# Patient Record
Sex: Male | Born: 1995 | Race: White | Hispanic: No | Marital: Single | Smoking: Never smoker
Health system: Southern US, Community
[De-identification: ages and names within clinical notes are randomized; demographics above are authoritative.]

## PROBLEM LIST (undated history)

## (undated) DIAGNOSIS — I82629 Acute embolism and thrombosis of deep veins of unspecified upper extremity: Secondary | ICD-10-CM

## (undated) DIAGNOSIS — D696 Thrombocytopenia, unspecified: Secondary | ICD-10-CM

## (undated) DIAGNOSIS — J40 Bronchitis, not specified as acute or chronic: Secondary | ICD-10-CM

---

## 2015-09-14 ENCOUNTER — Emergency Department (HOSPITAL_BASED_OUTPATIENT_CLINIC_OR_DEPARTMENT_OTHER): Payer: BLUE CROSS/BLUE SHIELD

## 2015-09-14 ENCOUNTER — Encounter (HOSPITAL_BASED_OUTPATIENT_CLINIC_OR_DEPARTMENT_OTHER): Payer: Self-pay | Admitting: Emergency Medicine

## 2015-09-14 ENCOUNTER — Inpatient Hospital Stay
Admission: EM | Admit: 2015-09-14 | Payer: Self-pay | Source: Other Acute Inpatient Hospital | Admitting: Cardiovascular Disease

## 2015-09-14 ENCOUNTER — Inpatient Hospital Stay (HOSPITAL_BASED_OUTPATIENT_CLINIC_OR_DEPARTMENT_OTHER)
Admission: EM | Admit: 2015-09-14 | Discharge: 2015-10-01 | DRG: 871 | Disposition: A | Discharge status: Home Health | Payer: BLUE CROSS/BLUE SHIELD | Attending: Internal Medicine | Admitting: Internal Medicine

## 2015-09-14 DIAGNOSIS — I509 Heart failure, unspecified: Secondary | ICD-10-CM | POA: Diagnosis not present

## 2015-09-14 DIAGNOSIS — B3322 Viral myocarditis: Secondary | ICD-10-CM | POA: Diagnosis present

## 2015-09-14 DIAGNOSIS — R233 Spontaneous ecchymoses: Secondary | ICD-10-CM | POA: Diagnosis present

## 2015-09-14 DIAGNOSIS — I272 Other secondary pulmonary hypertension: Secondary | ICD-10-CM | POA: Diagnosis present

## 2015-09-14 DIAGNOSIS — A419 Sepsis, unspecified organism: Principal | ICD-10-CM

## 2015-09-14 DIAGNOSIS — I82409 Acute embolism and thrombosis of unspecified deep veins of unspecified lower extremity: Secondary | ICD-10-CM | POA: Insufficient documentation

## 2015-09-14 DIAGNOSIS — N189 Chronic kidney disease, unspecified: Secondary | ICD-10-CM | POA: Diagnosis present

## 2015-09-14 DIAGNOSIS — E872 Acidosis: Secondary | ICD-10-CM

## 2015-09-14 DIAGNOSIS — N179 Acute kidney failure, unspecified: Secondary | ICD-10-CM | POA: Diagnosis present

## 2015-09-14 DIAGNOSIS — R6520 Severe sepsis without septic shock: Secondary | ICD-10-CM | POA: Diagnosis present

## 2015-09-14 DIAGNOSIS — R945 Abnormal results of liver function studies: Secondary | ICD-10-CM

## 2015-09-14 DIAGNOSIS — E669 Obesity, unspecified: Secondary | ICD-10-CM | POA: Diagnosis present

## 2015-09-14 DIAGNOSIS — J181 Lobar pneumonia, unspecified organism: Secondary | ICD-10-CM | POA: Diagnosis present

## 2015-09-14 DIAGNOSIS — Z6841 Body Mass Index (BMI) 40.0 and over, adult: Secondary | ICD-10-CM | POA: Diagnosis not present

## 2015-09-14 DIAGNOSIS — E871 Hypo-osmolality and hyponatremia: Secondary | ICD-10-CM | POA: Diagnosis present

## 2015-09-14 DIAGNOSIS — I998 Other disorder of circulatory system: Secondary | ICD-10-CM | POA: Diagnosis present

## 2015-09-14 DIAGNOSIS — R570 Cardiogenic shock: Secondary | ICD-10-CM | POA: Diagnosis not present

## 2015-09-14 DIAGNOSIS — E8809 Other disorders of plasma-protein metabolism, not elsewhere classified: Secondary | ICD-10-CM | POA: Diagnosis not present

## 2015-09-14 DIAGNOSIS — I5021 Acute systolic (congestive) heart failure: Secondary | ICD-10-CM | POA: Diagnosis not present

## 2015-09-14 DIAGNOSIS — F329 Major depressive disorder, single episode, unspecified: Secondary | ICD-10-CM | POA: Diagnosis not present

## 2015-09-14 DIAGNOSIS — I472 Ventricular tachycardia: Secondary | ICD-10-CM | POA: Diagnosis not present

## 2015-09-14 DIAGNOSIS — F419 Anxiety disorder, unspecified: Secondary | ICD-10-CM | POA: Diagnosis not present

## 2015-09-14 DIAGNOSIS — E876 Hypokalemia: Secondary | ICD-10-CM | POA: Diagnosis not present

## 2015-09-14 DIAGNOSIS — I429 Cardiomyopathy, unspecified: Secondary | ICD-10-CM | POA: Diagnosis not present

## 2015-09-14 DIAGNOSIS — J9601 Acute respiratory failure with hypoxia: Secondary | ICD-10-CM | POA: Diagnosis present

## 2015-09-14 DIAGNOSIS — I514 Myocarditis, unspecified: Secondary | ICD-10-CM

## 2015-09-14 DIAGNOSIS — I82629 Acute embolism and thrombosis of deep veins of unspecified upper extremity: Secondary | ICD-10-CM | POA: Diagnosis present

## 2015-09-14 DIAGNOSIS — I82621 Acute embolism and thrombosis of deep veins of right upper extremity: Secondary | ICD-10-CM | POA: Diagnosis not present

## 2015-09-14 DIAGNOSIS — I409 Acute myocarditis, unspecified: Secondary | ICD-10-CM | POA: Diagnosis not present

## 2015-09-14 DIAGNOSIS — R609 Edema, unspecified: Secondary | ICD-10-CM

## 2015-09-14 DIAGNOSIS — I82401 Acute embolism and thrombosis of unspecified deep veins of right lower extremity: Secondary | ICD-10-CM | POA: Diagnosis not present

## 2015-09-14 DIAGNOSIS — B3324 Viral cardiomyopathy: Secondary | ICD-10-CM | POA: Diagnosis present

## 2015-09-14 DIAGNOSIS — R600 Localized edema: Secondary | ICD-10-CM | POA: Diagnosis not present

## 2015-09-14 DIAGNOSIS — R918 Other nonspecific abnormal finding of lung field: Secondary | ICD-10-CM

## 2015-09-14 DIAGNOSIS — Z452 Encounter for adjustment and management of vascular access device: Secondary | ICD-10-CM | POA: Diagnosis present

## 2015-09-14 DIAGNOSIS — L270 Generalized skin eruption due to drugs and medicaments taken internally: Secondary | ICD-10-CM | POA: Diagnosis not present

## 2015-09-14 DIAGNOSIS — I420 Dilated cardiomyopathy: Secondary | ICD-10-CM | POA: Diagnosis present

## 2015-09-14 DIAGNOSIS — I1310 Hypertensive heart and chronic kidney disease without heart failure, with stage 1 through stage 4 chronic kidney disease, or unspecified chronic kidney disease: Secondary | ICD-10-CM | POA: Diagnosis present

## 2015-09-14 DIAGNOSIS — I400 Infective myocarditis: Secondary | ICD-10-CM | POA: Diagnosis not present

## 2015-09-14 DIAGNOSIS — M7989 Other specified soft tissue disorders: Secondary | ICD-10-CM | POA: Diagnosis present

## 2015-09-14 DIAGNOSIS — E875 Hyperkalemia: Secondary | ICD-10-CM | POA: Diagnosis present

## 2015-09-14 DIAGNOSIS — J189 Pneumonia, unspecified organism: Secondary | ICD-10-CM

## 2015-09-14 DIAGNOSIS — J129 Viral pneumonia, unspecified: Secondary | ICD-10-CM | POA: Diagnosis not present

## 2015-09-14 DIAGNOSIS — I38 Endocarditis, valve unspecified: Secondary | ICD-10-CM

## 2015-09-14 DIAGNOSIS — J9600 Acute respiratory failure, unspecified whether with hypoxia or hypercapnia: Secondary | ICD-10-CM

## 2015-09-14 DIAGNOSIS — R52 Pain, unspecified: Secondary | ICD-10-CM | POA: Diagnosis not present

## 2015-09-14 DIAGNOSIS — R0600 Dyspnea, unspecified: Secondary | ICD-10-CM | POA: Diagnosis not present

## 2015-09-14 DIAGNOSIS — I5023 Acute on chronic systolic (congestive) heart failure: Secondary | ICD-10-CM | POA: Diagnosis not present

## 2015-09-14 DIAGNOSIS — K761 Chronic passive congestion of liver: Secondary | ICD-10-CM | POA: Diagnosis present

## 2015-09-14 DIAGNOSIS — D696 Thrombocytopenia, unspecified: Secondary | ICD-10-CM | POA: Diagnosis not present

## 2015-09-14 DIAGNOSIS — K7689 Other specified diseases of liver: Secondary | ICD-10-CM | POA: Diagnosis not present

## 2015-09-14 DIAGNOSIS — R0602 Shortness of breath: Secondary | ICD-10-CM

## 2015-09-14 DIAGNOSIS — R7989 Other specified abnormal findings of blood chemistry: Secondary | ICD-10-CM

## 2015-09-14 DIAGNOSIS — R21 Rash and other nonspecific skin eruption: Secondary | ICD-10-CM | POA: Diagnosis not present

## 2015-09-14 HISTORY — DX: Acute embolism and thrombosis of deep veins of unspecified upper extremity: I82.629

## 2015-09-14 HISTORY — DX: Bronchitis, not specified as acute or chronic: J40

## 2015-09-14 HISTORY — DX: Thrombocytopenia, unspecified: D69.6

## 2015-09-14 LAB — I-STAT CG4 LACTIC ACID, ED: Lactic Acid, Venous: 8.62 mmol/L (ref 0.5–2.0)

## 2015-09-14 LAB — COMPREHENSIVE METABOLIC PANEL
ALBUMIN: 2.7 g/dL — AB (ref 3.5–5.0)
ALT: 66 U/L — ABNORMAL HIGH (ref 17–63)
AST: 77 U/L — AB (ref 15–41)
Alkaline Phosphatase: 82 U/L (ref 38–126)
Anion gap: 15 (ref 5–15)
BUN: 52 mg/dL — AB (ref 6–20)
CHLORIDE: 101 mmol/L (ref 101–111)
CO2: 16 mmol/L — AB (ref 22–32)
Calcium: 8.1 mg/dL — ABNORMAL LOW (ref 8.9–10.3)
Creatinine, Ser: 1.17 mg/dL (ref 0.61–1.24)
GFR calc Af Amer: >60 mL/min (ref >60)
GFR calc non Af Amer: >60 mL/min (ref >60)
GLUCOSE: 126 mg/dL — AB (ref 65–99)
POTASSIUM: 5.9 mmol/L — AB (ref 3.5–5.1)
Sodium: 132 mmol/L — ABNORMAL LOW (ref 135–145)
Total Bilirubin: 3.3 mg/dL — ABNORMAL HIGH (ref 0.3–1.2)
Total Protein: 6.2 g/dL — ABNORMAL LOW (ref 6.5–8.1)

## 2015-09-14 LAB — PROCALCITONIN: PROCALCITONIN: 2.32 ng/mL

## 2015-09-14 LAB — CBC WITH DIFFERENTIAL/PLATELET
Basophils Absolute: 0 10*3/uL (ref 0.0–0.1)
Basophils Relative: 0 %
EOS PCT: 0 %
Eosinophils Absolute: 0 10*3/uL (ref 0.0–0.7)
HEMATOCRIT: 43 % (ref 39.0–52.0)
Hemoglobin: 13.9 g/dL (ref 13.0–17.0)
LYMPHS ABS: 1.2 10*3/uL (ref 0.7–4.0)
Lymphocytes Relative: 6 %
MCH: 26.2 pg (ref 26.0–34.0)
MCHC: 32.3 g/dL (ref 30.0–36.0)
MCV: 81 fL (ref 78.0–100.0)
MONOS PCT: 9 %
Monocytes Absolute: 1.8 10*3/uL — ABNORMAL HIGH (ref 0.1–1.0)
NEUTROS PCT: 85 %
Neutro Abs: 17 10*3/uL — ABNORMAL HIGH (ref 1.7–7.7)
Platelets: UNDETERMINED 10*3/uL (ref 150–400)
RBC: 5.31 MIL/uL (ref 4.22–5.81)
RDW: 19.5 % — AB (ref 11.5–15.5)
WBC: 20 10*3/uL — AB (ref 4.0–10.5)

## 2015-09-14 LAB — GLUCOSE, CAPILLARY: GLUCOSE-CAPILLARY: 84 mg/dL (ref 65–99)

## 2015-09-14 LAB — CBC
HEMATOCRIT: 40.8 % (ref 39.0–52.0)
HEMOGLOBIN: 12.9 g/dL — AB (ref 13.0–17.0)
MCH: 26.2 pg (ref 26.0–34.0)
MCHC: 31.6 g/dL (ref 30.0–36.0)
MCV: 82.9 fL (ref 78.0–100.0)
Platelets: 162 10*3/uL (ref 150–400)
RBC: 4.92 MIL/uL (ref 4.22–5.81)
RDW: 17.6 % — ABNORMAL HIGH (ref 11.5–15.5)
WBC: 18 10*3/uL — ABNORMAL HIGH (ref 4.0–10.5)

## 2015-09-14 LAB — PROTIME-INR
INR: 1.96 — AB (ref 0.00–1.49)
Prothrombin Time: 22.2 seconds — ABNORMAL HIGH (ref 11.6–15.2)

## 2015-09-14 LAB — CBG MONITORING, ED: Glucose-Capillary: 139 mg/dL — ABNORMAL HIGH (ref 65–99)

## 2015-09-14 LAB — BRAIN NATRIURETIC PEPTIDE: B NATRIURETIC PEPTIDE 5: 1312.6 pg/mL — AB (ref 0.0–100.0)

## 2015-09-14 LAB — APTT: aPTT: 32 seconds (ref 24–37)

## 2015-09-14 MED ORDER — VANCOMYCIN HCL 500 MG IV SOLR: 500.0000 mg | Freq: Once | INTRAVENOUS | Status: DC

## 2015-09-14 MED ORDER — SODIUM CHLORIDE 0.9 % IV SOLN
1500.0000 mg | Freq: Once | INTRAVENOUS | Status: DC
  Filled 2015-09-14: qty 1500

## 2015-09-14 MED ORDER — SODIUM CHLORIDE 0.9 % IV BOLUS (SEPSIS)
1000.0000 mL | Freq: Once | INTRAVENOUS | Status: AC
  Administered 2015-09-14: 1000 mL via INTRAVENOUS

## 2015-09-14 MED ORDER — VANCOMYCIN HCL IN DEXTROSE 1-5 GM/200ML-% IV SOLN
1000.0000 mg | Freq: Once | INTRAVENOUS | Status: AC
  Administered 2015-09-14: 1000 mg via INTRAVENOUS
  Filled 2015-09-14: qty 200

## 2015-09-14 MED ORDER — VANCOMYCIN HCL IN DEXTROSE 1-5 GM/200ML-% IV SOLN
1000.0000 mg | Freq: Once | INTRAVENOUS | Status: DC
  Filled 2015-09-14: qty 200

## 2015-09-14 MED ORDER — PIPERACILLIN-TAZOBACTAM 3.375 G IVPB
3.3750 g | Freq: Three times a day (TID) | INTRAVENOUS | Status: DC
  Administered 2015-09-15 – 2015-09-21 (×18): 3.375 g via INTRAVENOUS
  Filled 2015-09-14 (×21): qty 50

## 2015-09-14 MED ORDER — FAMOTIDINE 20 MG PO TABS
20.0000 mg | ORAL_TABLET | Freq: Two times a day (BID) | ORAL | Status: DC
  Administered 2015-09-15 – 2015-10-01 (×33): 20 mg via ORAL
  Filled 2015-09-14 (×34): qty 1

## 2015-09-14 MED ORDER — SODIUM CHLORIDE 0.9 % IV BOLUS (SEPSIS)
500.0000 mL | Freq: Once | INTRAVENOUS | Status: AC
  Administered 2015-09-14: 500 mL via INTRAVENOUS

## 2015-09-14 MED ORDER — VANCOMYCIN HCL 10 G IV SOLR
1500.0000 mg | Freq: Three times a day (TID) | INTRAVENOUS | Status: DC
  Administered 2015-09-15 – 2015-09-18 (×10): 1500 mg via INTRAVENOUS
  Filled 2015-09-14 (×13): qty 1500

## 2015-09-14 MED ORDER — VANCOMYCIN HCL 500 MG IV SOLR
INTRAVENOUS | Status: AC
  Administered 2015-09-14: 500 mg
  Filled 2015-09-14: qty 500

## 2015-09-14 MED ORDER — PIPERACILLIN-TAZOBACTAM 3.375 G IVPB 30 MIN
3.3750 g | Freq: Once | INTRAVENOUS | Status: AC
  Administered 2015-09-14: 3.375 g via INTRAVENOUS
  Filled 2015-09-14 (×2): qty 50

## 2015-09-14 MED ORDER — SODIUM CHLORIDE 0.9 % IV SOLN: 250.0000 mL | INTRAVENOUS | Status: DC | PRN

## 2015-09-14 MED ORDER — SODIUM CHLORIDE 0.9 % IV SOLN
INTRAVENOUS | Status: DC
  Administered 2015-09-15 – 2015-09-20 (×3): via INTRAVENOUS

## 2015-09-14 MED ORDER — HEPARIN SODIUM (PORCINE) 5000 UNIT/ML IJ SOLN: 5000.0000 [IU] | Freq: Three times a day (TID) | INTRAMUSCULAR | Status: DC

## 2015-09-14 NOTE — ED Notes (Signed)
Spoke with pharmacist Georgina Pillion patient to receive total of 2500mg  of vanco 3rd dose infuse 1/2 bag.

## 2015-09-14 NOTE — ED Notes (Signed)
Critical Lactic Acid 8.62 reported to Dr. Verdie Mosher

## 2015-09-14 NOTE — ED Notes (Signed)
Pt given water ok'd by MD. Pt appears in mild respiratory distress.  Pt is tachypneic but states "I feel ok".

## 2015-09-14 NOTE — ED Notes (Signed)
Patient had been laying in his recliner for 2 weeks because he was sick with bronchitis. The patient has swelling noted to his bilateral feet and necrotic toes to up to mid feet.

## 2015-09-14 NOTE — Progress Notes (Signed)
Pharmacy Antibiotic Note  James Bennett is a 20 y.o. male who presented to [facility] admitted on 09/14/2015 with bilateral LE swelling (with noted discoloration of toes) for 1 week and SOB/cough for 2.5 months. Pharmacy consulted to dose Vancomycin + Cefepime for r/o sepsis.   The patient has already received a dose of Zosyn 3.375g IV x 1 dose at 1638. No Vancomycin has been given yet. SCr 1.17, CrCl~100 ml/min (normalized)  Vancomycin doses will have to be entered as sequential doses for a total loading dose of 2500 mg. This was discussed with the [facility] RN over the phone and clarification was given.   Plan: 1. Vancomycin 2500 mg IV x 1 dose to load (entered as 1g IV x 1 followed by 1g IV x 1 followed by 500 mg IV x 1 - discussed with RN) 2. After the Vancomycin LD, start Vancomycin 1500 mg IV every 8 hours (next dose due on 2/21 at 0800) 3. Zosyn 3.375g IV every 8 hours (next dose due at 0100 on 2/21) 4. Would recommend getting a Vancomycin trough prior to the 4th dose to access Vancomycin dose (using obesity protocol) 5. Will continue to follow renal function, culture results, LOT, and antibiotic de-escalation plans   Height: 6\' 3"  (190.5 cm) Weight: (!) 343 lb (155.584 kg) IBW/kg (Calculated) : 84.5  Temp (24hrs), Avg:97.4 F (36.3 C), Min:97.4 F (36.3 C), Max:97.4 F (36.3 C)   Recent Labs Lab 09/14/15 1610 09/14/15 1618  WBC 20.0*  --   LATICACIDVEN  --  8.62*    CrCl cannot be calculated (Patient has no serum creatinine result on file.).    No Known Allergies  Antimicrobials this admission: Zosyn 2/20 >> Vanc 2/20 >>  Dose adjustments this admission: n/a  Microbiology results: n/a  Thank you for allowing pharmacy to be a part of this patient's care.  Georgina Pillion, PharmD, BCPS Clinical Pharmacist Pager: [phone] 09/14/2015 4:51 PM

## 2015-09-14 NOTE — H&P (Signed)
PULMONARY / CRITICAL CARE MEDICINE   Name: James Bennett MRN: [id number] DOB: April 28, 1996    ADMISSION DATE:  09/14/2015 CONSULTATION DATE:  09/14/15  REFERRING MD:  Eustaquio Maize  CHIEF COMPLAINT:  Sepsis   HISTORY OF PRESENT ILLNESS:   James Bennett is a 20 y.o. male who presented to [facility] with complaints of gradual onset, constant, worsening, bilateral lower leg swelling x 1 week. Pt reports that he was having shortness of breath and a dry cough since New Years (approximately 2.5 months ago). He was seen by his PCP 3 weeks ago and diagnosed with acute bronchitis. Pt was placed on Azithromycin and Prednisone without much relief. He was seen again last week and placed on Levaquin. Pt was seen again a couple of days ago and placed on Amoxicillin and a second round of Prednisone. He reports that his shortness of breath has since improved but he is still having a dry cough. Pt mentions that he has been lying around in his recliner for the past 2 weeks due to his bronchitis. He was unconcerned about his lower leg swelling until he noticed this morning that his toes were slightly blackened/purple in color. States that the color changes have been occuring over the last 3 days. Associated coolness to extremities appreciated. Pt states that he has been urinating normally but mentions an orange color to the urine since being on antibiotics.   Of note, patient has 6 dogs at home and one parrot he is exposed to at his dad's job. No recent travel. Mother has h/o Lupus and RA.   Denies fever, chills, diarrhea, abdominal pain, nausea, vomiting, chest pain, or any other associated symptoms.   PAST MEDICAL HISTORY :  He  has a past medical history of Bronchitis.  PAST SURGICAL HISTORY: No reported surgeries.   No Known Allergies  No current facility-administered medications on file prior to encounter.   No current outpatient prescriptions on file prior to encounter.    FAMILY HISTORY:  Family  History  Problem Relation Age of Onset  . Lupus Mother     SOCIAL HISTORY: Social History   Social History  . Marital Status: Single    Spouse Name: N/A  . Number of Children: N/A  . Years of Education: N/A   Social History Main Topics  . Smoking status: Never Smoker   . Smokeless tobacco: None  . Alcohol Use: No  . Drug Use: No  . Sexual Activity: Not Asked   Other Topics Concern  . 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 Bird exposure through his father's workplace of a small parrot. No mold exposure. No recent travel.    REVIEW OF SYSTEMS:  Patient denies any new rashes or abnormal bruising. He denies any lymphadenopathy in his neck, groin, or axilla. A pertinent 14 point review of systems is negative except as per the history of presenting illness.  SUBJECTIVE:  Tachycardic. Resting comfortably in bed. On room air with normal BP.  VITAL SIGNS: BP 111/45 mmHg  Pulse 129  Temp(Src) 98.4 F (36.9 C) (Oral)  Resp 13  Ht 6' 2"  (1.88 m)  Wt 331 lb (150.141 kg)  BMI 42.48 kg/m2  SpO2 100%  HEMODYNAMICS:    VENTILATOR SETTINGS:    INTAKE / OUTPUT: I/O last 3 completed shifts: In: 2750 [I.V.:2750] Out: -   PHYSICAL EXAMINATION: General:  19yo WM with NAD, resting comfortably in bed,  awake Neuro:  A&Ox3, non-focal, follows commands. Sensation and strength intact HEENT: [place]/AT, dry MM, EOMI, no scleral icterus, O/P clear, no cervical adenopathy Lymphatics: No appreciable cervical or supraclavicular lymphadenopathy based on body habitus. Cardiovascular:  Tachycardic, regular rhythm, no m/r/g appreciated Lungs:  CTAB, no w/r/r, normal work of breathing on room air. Abdomen:  Soft, non-tender, +BS, protuberant  Musculoskeletal:  3+ pitting edema BLE, R hand swelling. Moves all extremities.  Cyanotic toes bilaterally.  Skin: Petechiae appreciated on legs and abdomen. Cool extremities  (feet and hands).  LABS:  BMET  Recent Labs Lab 09/14/15 1650  NA 132*  K 5.9*  CL 101  CO2 16*  BUN 52*  CREATININE 1.17  GLUCOSE 126*    Electrolytes  Recent Labs Lab 09/14/15 1650  CALCIUM 8.1*    CBC  Recent Labs Lab 09/14/15 1610  WBC 20.0*  HGB 13.9  HCT 43.0  PLT PLATELET CLUMPS NOTED ON SMEAR, UNABLE TO ESTIMATE    Coag's  Recent Labs Lab 09/14/15 1650  APTT 32  INR 1.96*    Sepsis Markers  Recent Labs Lab 09/14/15 1618 09/14/15 1650  LATICACIDVEN 8.62*  --   PROCALCITON  --  2.32    ABG No results for input(s): PHART, PCO2ART, PO2ART in the last 168 hours.  Liver Enzymes  Recent Labs Lab 09/14/15 1650  AST 77*  ALT 66*  ALKPHOS 82  BILITOT 3.3*  ALBUMIN 2.7*    Cardiac Enzymes No results for input(s): TROPONINI, PROBNP in the last 168 hours.  Glucose  Recent Labs Lab 09/14/15 1547 09/14/15 2205  GLUCAP 139* 84    Imaging US Venous Img Lower Bilateral  09/14/2015  CLINICAL DATA:  bilateral lower extremity swelling x1 0.5 weeks EXAM: BILATERAL LOWER EXTREMITY VENOUS DOPPLER ULTRASOUND TECHNIQUE: Gray-scale sonography with compression, as well as color and duplex ultrasound, were performed to evaluate the deep venous system from the level of the common femoral vein through the popliteal and proximal calf veins. COMPARISON:  None FINDINGS: Normal compressibility of the common femoral, superficial femoral, and popliteal veins, as well as the proximal calf veins. No filling defects to suggest DVT on grayscale or color Doppler imaging. Doppler waveforms show normal direction of venous flow, normal respiratory phasicity and response to augmentation. Marked subcutaneous edema right thigh, left ankle. Survey views of the contralateral common femoral vein are unremarkable. IMPRESSION: 1. No evidence of lower extremity deep vein thrombosis, BILATERALLY. Electronically Signed   By: Lucrezia Europe M.D.   On: 09/14/2015 18:27   Dg Chest  Port 1 View  09/14/2015  CLINICAL DATA:  20 year old with 2 week history of shortness of breath and 1-1/2 week history of bilateral lower extremity edema. Recent diagnosis of bronchitis. EXAM: PORTABLE CHEST 1 VIEW COMPARISON:  None. FINDINGS: Cardiac silhouette upper normal in size to slightly enlarged even allowing for AP portable technique. Airspace consolidation in the right upper lobe, right lower lobe, and lingula. No pleural effusions. Normal pulmonary vascularity. IMPRESSION: 1. Multilobar pneumonia involving the right upper lobe, right lower lobe and lingula. 2. Mild cardiomegaly. Electronically Signed   By: Evangeline Dakin M.D.   On: 09/14/2015 17:17     STUDIES:  2/20 - venous doppler US without signs of DVT 2/20 - CXR with multilobar PNA  CULTURES: BCx 2/20>> UCx 2/20>> RVP 2/20>> Sputum culture 2/20>>  ANTIBIOTICS: Vanc 2/20 >> Zoysn 2/20 >>  SIGNIFICANT EVENTS: 2/20 - admitted for severe sepsis  LINES/TUBES: PIVs  DISCUSSION: 20 y.o. male with no past  medical history. Presented to [facility] with bialteral leg swelling and dyspnea for last 2 months not improved with steroids and antibiotics. On admission met sepsis criteria. Lactic acid was 8.6, WBC 20, afebrile.   ASSESSMENT / PLAN:  PULMONARY A: ?Multilobar community acquired pneumonia vs autoimmune process causing ILD Tachypnea At risk for decompensation  P:   Monitor Respiratory status Maintain O2 sats >92%; supplement as needed CT chest ordered to further diagnosis lung findings IS  CARDIOVASCULAR A:  Elevated BNP Tachycardia  P:  Cardiac monitoring Repeat EKG in AM Trop Echo in AM - looking for endocarditis, HF  RENAL A:   AKI Hyponatremia Hyperkalemia  P:   Repeat CMP S/p 2L prior to admission Replace electrolytes as need NS @75mL  Monitor UOP  GASTROINTESTINAL A:   Elevated LFTs  P:   Regular diet PPI  Trend LFTs  HEMATOLOGIC A:   Petechiae   Leukocytosis Elevated INR  P:  Hold off on pharmacologic VTE prophylaxis Repeat coags in AM Trend CBC Monitor for bleeding  INFECTIOUS A:   Severe sepsis 2/2 ?CAP - qSOFA neg but meets SIRS criteria with HR, lactic acid, and leukocytosis Lactic acidosis  P:   Follow cultures Abx as above (vanc/zoysn) Trend Pct and lactic acid Arterial dopplers   ENDOCRINE A:   No acute issues P:   Monitor  AUTOIMMUNE A: H/o autoimmune disease in family Possible ILD from automimmune process on CXR  P: Full autoimmune lab work-up collected Follow labs  NEUROLOGIC A:   No acute issues At risk for acute metabolic encephalopathy   P:   RASS goal: 0 Monitor   FAMILY  - Updates: Family and patient updated at bedside - Inter-disciplinary family meet or Palliative Care meeting due by: 09/21/15  Luiz Blare, DO 09/14/2015, 11:56 PM PGY-2, [place]  PCCM Attending Note:  Patient seen and examined with resident physician. Please refer to admission H&P which I reviewed in detail. Patient presenting with an atypical picture for an infectious etiology but certainly could be an infectious endocarditis with septic embolization. Autoimmune pathologies must still be considered given his family history of lupus with blue discoloration in the toes suggestive of a vasculitis versus ischemia. Currently patient is on room air. Continuing autoimmune workup with serologies. Awaiting transthoracic echocardiogram. Assessing lower extremity arterial flow. Continuing broad-spectrum antibiotic therapy for possible pneumonia versus endocarditis. Holding off on diuresis at this time.  I have spent a total of 37 minutes of critical care time today caring for the patient and reviewing the patient's electronic medical record.  Sonia Baller Ashok Cordia, M.D. [facility] Pager:  [phone] After 3pm or if no response, call [phone]

## 2015-09-14 NOTE — ED Provider Notes (Signed)
CSN: [id number]     Arrival date & time 09/14/15  1531 History  By signing my name below, I, Tanda Rockers, attest that this documentation has been prepared under the direction and in the presence of Lavera Guise, MD. Electronically Signed: Tanda Rockers, ED Scribe. 09/14/2015. 4:10 PM.   Chief Complaint  Patient presents with  . Foot Swelling   The history is provided by the patient. No language interpreter was used.     HPI Comments: James Bennett is a 20 y.o. male who presents to the Emergency Department complaining of gradual onset, constant, worsening, bilateral lower leg swelling x 1 week. Pt reports that he was having shortness of breath and a dry cough since New Years (approximately 2.5 months ago). He was seen by his PCP 3 weeks ago and diagnosed with acute bronchitis. Pt was placed on Azithromycin and Prednisone without much relief. He was seen again last week and placed on Levaquin. Pt was seen again a couple of days ago and placed on Amoxicillin and a second round of Prednisone. He reports that his shortness of breath has since improved but he is still having a dry cough. Pt mentions that he has been lying around in his recliner for the past 2 weeks due to his bronchitis. He was unconcerned about his lower leg swelling until he noticed this morning that his toes were slightly blackened/purple in color, prompting him to call his father and came to the ED for further evaluation. Pt states that he has been urinating normally but mentions an orange color to the urine since being on antibiotics. Pt does report possible recent sick contact while working at an [employer]. Denies fever, chills, diarrhea, abdominal pain, nausea, vomiting, chest pain, or any other associated symptoms. No recent prolonged travel. Fhx Lupus and Rheumatoid Arthritis with mother. No IVDU.  Past Medical History  Diagnosis Date  . Bronchitis    History reviewed. No pertinent past surgical history. History reviewed. No  pertinent family history. Social History  Substance Use Topics  . Smoking status: Never Smoker   . Smokeless tobacco: None  . Alcohol Use: No    Review of Systems  Constitutional: Negative for fever and chills.  Respiratory: Positive for cough. Negative for shortness of breath.   Cardiovascular: Positive for leg swelling. Negative for chest pain.  Gastrointestinal: Negative for nausea, vomiting, abdominal pain and diarrhea.  Skin: Positive for color change (toes on bilateral feet).  All other systems reviewed and are negative.  Allergies  Review of patient's allergies indicates no known allergies.  Home Medications   Prior to Admission medications   Medication Sig Start Date End Date Taking? Authorizing Provider  amoxicillin-clavulanate (AUGMENTIN) 875-125 MG tablet Take 1 tablet by mouth 2 (two) times daily.   Yes Historical Provider, MD  prednisoLONE (ORAPRED ODT) 10 MG disintegrating tablet Take 20 mg by mouth daily.   Yes Historical Provider, MD   BP 111/45 mmHg  Pulse 129  Temp(Src) 98.7 F (37.1 C) (Oral)  Resp 13  Ht  (1.88 m)  Wt 331 lb (150.141 kg)  BMI 42.48 kg/m2  SpO2 100%   Physical Exam  Physical Exam  Nursing note and vitals reviewed. Constitutional: Looks unwell but is non toxic and in no acute distress, comfortable Head: Normocephalic and atraumatic.  Mouth/Throat: Oropharynx is clear. Dry mucous membranes.  Neck: Normal range of motion. Neck supple.  Cardiovascular: Tachycardic rate and regular rhythm.   Pulmonary/Chest: Effort normal and breath sounds normal.  Limited  by body habitus.  Abdominal: Soft. There is no tenderness. There is no rebound and no guarding.  Musculoskeletal: Normal range of motion. Diffuse pitting edema in bilateral upper and lower extremities and abdomen.  Neurological: Alert, no facial droop, fluent speech, moves all extremities symmetrically Skin: Skin is cool and dry. Petechial like rash in upper extremities. Blanching  violaceous/petechial rash over trunk. Distal toes are purple. Dopplerable DP pulses.  Psychiatric: Cooperative  ED Course  Procedures (including critical care time)  DIAGNOSTIC STUDIES: Oxygen Saturation is 100% on RA, normal by my interpretation.    COORDINATION OF CARE: 4:05 PM-Discussed treatment plan which includes admission with pt at bedside and pt agreed to plan.   Labs Review Labs Reviewed  CBC WITH DIFFERENTIAL/PLATELET - Abnormal; Notable for the following:    WBC 20.0 (*)    RDW 19.5 (*)    Neutro Abs 17.0 (*)    Monocytes Absolute 1.8 (*)    All other components within normal limits  BRAIN NATRIURETIC PEPTIDE - Abnormal; Notable for the following:    B Natriuretic Peptide 1312.6 (*)    All other components within normal limits  COMPREHENSIVE METABOLIC PANEL - Abnormal; Notable for the following:    Sodium 132 (*)    Potassium 5.9 (*)    CO2 16 (*)    Glucose, Bld 126 (*)    BUN 52 (*)    Calcium 8.1 (*)    Total Protein 6.2 (*)    Albumin 2.7 (*)    AST 77 (*)    ALT 66 (*)    Total Bilirubin 3.3 (*)    All other components within normal limits  PROTIME-INR - Abnormal; Notable for the following:    Prothrombin Time 22.2 (*)    INR 1.96 (*)    All other components within normal limits  I-STAT CG4 LACTIC ACID, ED - Abnormal; Notable for the following:    Lactic Acid, Venous 8.62 (*)    All other components within normal limits  CBG MONITORING, ED - Abnormal; Notable for the following:    Glucose-Capillary 139 (*)    All other components within normal limits  CULTURE, BLOOD (ROUTINE X 2)  CULTURE, BLOOD (ROUTINE X 2)  URINE CULTURE  MRSA PCR SCREENING  RESPIRATORY VIRUS PANEL  CULTURE, EXPECTORATED SPUTUM-ASSESSMENT  PROCALCITONIN  APTT  GLUCOSE, CAPILLARY  URINALYSIS, ROUTINE W REFLEX MICROSCOPIC (NOT AT [facility])  LEGIONELLA ANTIGEN, URINE  STREP PNEUMONIAE URINARY ANTIGEN  MPO/PR-3 (ANCA) ANTIBODIES  HIV ANTIBODY (ROUTINE TESTING)  SEDIMENTATION  RATE  C-REACTIVE PROTEIN  RHEUMATOID FACTOR  ANA, BODY FLUID  ANTI-JO 1 ANTIBODY, IGG  ANTI-DNA ANTIBODY, DOUBLE-STRANDED  C3 COMPLEMENT  C4 COMPLEMENT  ANTIPHOSPHOLIPID SYNDROME EVAL, BLD  ANTI-SMITH ANTIBODY  CBC  CBC  MAGNESIUM  PHOSPHORUS  COMPREHENSIVE METABOLIC PANEL  PROCALCITONIN  LACTIC ACID, PLASMA  LACTIC ACID, PLASMA  TROPONIN I  MAGNESIUM  PHOSPHORUS  COMPREHENSIVE METABOLIC PANEL  INFLUENZA PANEL BY PCR (TYPE A & B, H1N1)  PROTIME-INR  APTT  TSH  I-STAT CG4 LACTIC ACID, ED    Imaging Review US Venous Img Lower Bilateral  09/14/2015  CLINICAL DATA:  bilateral lower extremity swelling x1 0.5 weeks EXAM: BILATERAL LOWER EXTREMITY VENOUS DOPPLER ULTRASOUND TECHNIQUE: Gray-scale sonography with compression, as well as color and duplex ultrasound, were performed to evaluate the deep venous system from the level of the common femoral vein through the popliteal and proximal calf veins. COMPARISON:  None FINDINGS: Normal compressibility of the common femoral, superficial femoral, and popliteal  veins, as well as the proximal calf veins. No filling defects to suggest DVT on grayscale or color Doppler imaging. Doppler waveforms show normal direction of venous flow, normal respiratory phasicity and response to augmentation. Marked subcutaneous edema right thigh, left ankle. Survey views of the contralateral common femoral vein are unremarkable. IMPRESSION: 1. No evidence of lower extremity deep vein thrombosis, BILATERALLY. Electronically Signed   By: Corlis Leak M.D.   On: 09/14/2015 18:27   Dg Chest Port 1 View  09/14/2015  CLINICAL DATA:  20 year old with 2 week history of shortness of breath and 1-1/2 week history of bilateral lower extremity edema. Recent diagnosis of bronchitis. EXAM: PORTABLE CHEST 1 VIEW COMPARISON:  None. FINDINGS: Cardiac silhouette upper normal in size to slightly enlarged even allowing for AP portable technique. Airspace consolidation in the right upper  lobe, right lower lobe, and lingula. No pleural effusions. Normal pulmonary vascularity. IMPRESSION: 1. Multilobar pneumonia involving the right upper lobe, right lower lobe and lingula. 2. Mild cardiomegaly. Electronically Signed   By: Hulan Saas M.D.   On: 09/14/2015 17:17   I have personally reviewed and evaluated these images and lab results as part of my medical decision-making.   EKG Interpretation   Date/Time:  Monday September 14 2015 15:52:37 EST Ventricular Rate:  135 PR Interval:  146 QRS Duration: 82 QT Interval:  297 QTC Calculation: 445 R Axis:   90 Text Interpretation:  Sinus tachycardia Borderline right axis deviation  Low voltage, extremity leads no prior EKG  Confirmed by Anavi Branscum MD, Makaiah Terwilliger  [phone]) on 09/14/2015 8:11:42 PM      CRITICAL CARE Performed by: Lavera Guise   Total critical care time: 35 minutes  Critical care time was exclusive of separately billable procedures and treating other patients.  Critical care was necessary to treat or prevent imminent or life-threatening deterioration.  Critical care was time spent personally by me on the following activities: development of treatment plan with patient and/or surrogate as well as nursing, discussions with consultants, evaluation of patient's response to treatment, examination of patient, obtaining history from patient or surrogate, ordering and performing treatments and interventions, ordering and review of laboratory studies, ordering and review of radiographic studies, pulse oximetry and re-evaluation of patient's condition. Care   MDM   Final diagnoses:  Lobar pneumonia (HCC)  Severe sepsis (HCC)   I personally performed the services described in this documentation, which was scribed in my presence. The recorded information has been reviewed and is accurate.   20 year old male otherwise healthy who presents with 2 months of persistent cough and shortness of breath with edema over past 1-2 weeks.  Non-toxic and in no acute distress on arrival, but appearing unwell. Tachycardic 130s, but normotensive and afebrile. On room air without significant increased work of breathing. Concern for severe sepsis - lactic acidosis 8.6 and leukocytosis of 20 with CXR showing multilobar PNA. Question of some evidence of end organ damage. Mild transamnitis w/ hyperbilirubinemia and mild coagulopathy. Bedside echo also revealing decreased EF with cardiomegaly on X-ray and elevated BNP - ? Sepsis induced or other cardiomyopathy.  ? Of embolic phenomenon with cyanosis at the toes bilaterally.  No major risk factors for endocarditis, but a possibility. Covered with vancomycin and zosyn and fluid resuscitated. Blood cultures, urine pending.  Discussed with ICU, and will admit for ongoing work-up and treatment. Transferred to [facility] in stable condition.    Lavera Guise, MD 09/14/15 [phone]

## 2015-09-14 NOTE — ED Notes (Signed)
Urine specimen requested pt unable to void at this time 

## 2015-09-14 NOTE — ED Notes (Signed)
MD at bedside. 

## 2015-09-15 ENCOUNTER — Inpatient Hospital Stay (HOSPITAL_COMMUNITY): Payer: BLUE CROSS/BLUE SHIELD

## 2015-09-15 ENCOUNTER — Encounter (HOSPITAL_COMMUNITY): Payer: Self-pay | Admitting: Radiology

## 2015-09-15 ENCOUNTER — Encounter (HOSPITAL_COMMUNITY): Payer: BLUE CROSS/BLUE SHIELD

## 2015-09-15 DIAGNOSIS — I5021 Acute systolic (congestive) heart failure: Secondary | ICD-10-CM

## 2015-09-15 DIAGNOSIS — R600 Localized edema: Secondary | ICD-10-CM

## 2015-09-15 DIAGNOSIS — R52 Pain, unspecified: Secondary | ICD-10-CM

## 2015-09-15 DIAGNOSIS — I998 Other disorder of circulatory system: Secondary | ICD-10-CM

## 2015-09-15 DIAGNOSIS — Z452 Encounter for adjustment and management of vascular access device: Secondary | ICD-10-CM | POA: Diagnosis present

## 2015-09-15 DIAGNOSIS — I429 Cardiomyopathy, unspecified: Secondary | ICD-10-CM

## 2015-09-15 DIAGNOSIS — R0600 Dyspnea, unspecified: Secondary | ICD-10-CM

## 2015-09-15 DIAGNOSIS — J9601 Acute respiratory failure with hypoxia: Secondary | ICD-10-CM

## 2015-09-15 DIAGNOSIS — J9600 Acute respiratory failure, unspecified whether with hypoxia or hypercapnia: Secondary | ICD-10-CM | POA: Diagnosis present

## 2015-09-15 DIAGNOSIS — N179 Acute kidney failure, unspecified: Secondary | ICD-10-CM

## 2015-09-15 LAB — COMPREHENSIVE METABOLIC PANEL
ALBUMIN: 2.3 g/dL — AB (ref 3.5–5.0)
ALBUMIN: 2.6 g/dL — AB (ref 3.5–5.0)
ALK PHOS: 71 U/L (ref 38–126)
ALT: 60 U/L (ref 17–63)
ALT: 66 U/L — ABNORMAL HIGH (ref 17–63)
ANION GAP: 14 (ref 5–15)
ANION GAP: 20 — AB (ref 5–15)
AST: 66 U/L — ABNORMAL HIGH (ref 15–41)
AST: 78 U/L — ABNORMAL HIGH (ref 15–41)
Alkaline Phosphatase: 79 U/L (ref 38–126)
BUN: 50 mg/dL — ABNORMAL HIGH (ref 6–20)
BUN: 54 mg/dL — ABNORMAL HIGH (ref 6–20)
CALCIUM: 8.5 mg/dL — AB (ref 8.9–10.3)
CALCIUM: 8.7 mg/dL — AB (ref 8.9–10.3)
CHLORIDE: 100 mmol/L — AB (ref 101–111)
CO2: 19 mmol/L — AB (ref 22–32)
CO2: 13 mmol/L — AB (ref 22–32)
Chloride: 100 mmol/L — ABNORMAL LOW (ref 101–111)
Creatinine, Ser: 1.33 mg/dL — ABNORMAL HIGH (ref 0.61–1.24)
Creatinine, Ser: 1.35 mg/dL — ABNORMAL HIGH (ref 0.61–1.24)
GFR calc non Af Amer: >60 mL/min (ref >60)
GFR calc non Af Amer: >60 mL/min (ref >60)
GLUCOSE: 110 mg/dL — AB (ref 65–99)
GLUCOSE: 82 mg/dL (ref 65–99)
POTASSIUM: 5.6 mmol/L — AB (ref 3.5–5.1)
POTASSIUM: 6.6 mmol/L — AB (ref 3.5–5.1)
SODIUM: 133 mmol/L — AB (ref 135–145)
SODIUM: 133 mmol/L — AB (ref 135–145)
Total Bilirubin: 3.1 mg/dL — ABNORMAL HIGH (ref 0.3–1.2)
Total Bilirubin: 3.3 mg/dL — ABNORMAL HIGH (ref 0.3–1.2)
Total Protein: 5.7 g/dL — ABNORMAL LOW (ref 6.5–8.1)
Total Protein: 6.2 g/dL — ABNORMAL LOW (ref 6.5–8.1)

## 2015-09-15 LAB — RAPID URINE DRUG SCREEN, HOSP PERFORMED
AMPHETAMINES: NOT DETECTED
Barbiturates: NOT DETECTED
Benzodiazepines: NOT DETECTED
COCAINE: NOT DETECTED
OPIATES: POSITIVE — AB
TETRAHYDROCANNABINOL: NOT DETECTED

## 2015-09-15 LAB — CBC
HCT: 39.1 % (ref 39.0–52.0)
HEMOGLOBIN: 12.5 g/dL — AB (ref 13.0–17.0)
MCH: 26.3 pg (ref 26.0–34.0)
MCHC: 32 g/dL (ref 30.0–36.0)
MCV: 82.3 fL (ref 78.0–100.0)
PLATELETS: 141 10*3/uL — AB (ref 150–400)
RBC: 4.75 MIL/uL (ref 4.22–5.81)
RDW: 17.4 % — ABNORMAL HIGH (ref 11.5–15.5)
WBC: 17.8 10*3/uL — ABNORMAL HIGH (ref 4.0–10.5)

## 2015-09-15 LAB — LEGIONELLA ANTIGEN, URINE

## 2015-09-15 LAB — URINALYSIS, ROUTINE W REFLEX MICROSCOPIC
GLUCOSE, UA: NEGATIVE mg/dL
Hgb urine dipstick: NEGATIVE
Ketones, ur: 15 mg/dL — AB
Nitrite: NEGATIVE
PH: 5 (ref 5.0–8.0)
Protein, ur: 30 mg/dL — AB
SPECIFIC GRAVITY, URINE: 1.036 — AB (ref 1.005–1.030)

## 2015-09-15 LAB — C-REACTIVE PROTEIN: CRP: 14.1 mg/dL — AB (ref <1.0)

## 2015-09-15 LAB — SEDIMENTATION RATE: SED RATE: 1 mm/h (ref 0–16)

## 2015-09-15 LAB — LACTIC ACID, PLASMA
LACTIC ACID, VENOUS: 3.7 mmol/L — AB (ref 0.5–2.0)
Lactic Acid, Venous: 6.4 mmol/L (ref 0.5–2.0)

## 2015-09-15 LAB — URINE MICROSCOPIC-ADD ON: RBC / HPF: NONE SEEN RBC/hpf (ref 0–5)

## 2015-09-15 LAB — TSH: TSH: 3.11 u[IU]/mL (ref 0.350–4.500)

## 2015-09-15 LAB — APTT: APTT: 31 s (ref 24–37)

## 2015-09-15 LAB — MRSA PCR SCREENING: MRSA BY PCR: NEGATIVE

## 2015-09-15 LAB — STREP PNEUMONIAE URINARY ANTIGEN: Strep Pneumo Urinary Antigen: NEGATIVE

## 2015-09-15 LAB — INFLUENZA PANEL BY PCR (TYPE A & B)
H1N1FLUPCR: NOT DETECTED
INFLBPCR: NEGATIVE
Influenza A By PCR: NEGATIVE

## 2015-09-15 LAB — TROPONIN I
TROPONIN I: 0.08 ng/mL — AB (ref <0.031)
TROPONIN I: 0.05 ng/mL — AB (ref <0.031)

## 2015-09-15 LAB — PROCALCITONIN: Procalcitonin: 3.56 ng/mL

## 2015-09-15 LAB — CARBOXYHEMOGLOBIN
CARBOXYHEMOGLOBIN: 1.8 % — AB (ref 0.5–1.5)
Methemoglobin: 0.7 % (ref 0.0–1.5)
O2 Saturation: 79.1 %
Total hemoglobin: 12.1 g/dL — ABNORMAL LOW (ref 13.5–18.0)

## 2015-09-15 LAB — MAGNESIUM
MAGNESIUM: 2.1 mg/dL (ref 1.7–2.4)
Magnesium: 1.8 mg/dL (ref 1.7–2.4)

## 2015-09-15 LAB — BILIRUBIN, FRACTIONATED(TOT/DIR/INDIR)
BILIRUBIN DIRECT: 2.2 mg/dL — AB (ref 0.1–0.5)
BILIRUBIN INDIRECT: 2.1 mg/dL — AB (ref 0.3–0.9)
BILIRUBIN TOTAL: 4.3 mg/dL — AB (ref 0.3–1.2)

## 2015-09-15 LAB — PHOSPHORUS
PHOSPHORUS: 5.3 mg/dL — AB (ref 2.5–4.6)
Phosphorus: 4.4 mg/dL (ref 2.5–4.6)

## 2015-09-15 LAB — PROTIME-INR
INR: 2.09 — ABNORMAL HIGH (ref 0.00–1.49)
INR: 2.22 — AB (ref 0.00–1.49)
PROTHROMBIN TIME: 23.3 s — AB (ref 11.6–15.2)
PROTHROMBIN TIME: 24.4 s — AB (ref 11.6–15.2)

## 2015-09-15 LAB — HIV ANTIBODY (ROUTINE TESTING W REFLEX): HIV Screen 4th Generation wRfx: NONREACTIVE

## 2015-09-15 LAB — GLUCOSE, CAPILLARY: GLUCOSE-CAPILLARY: 91 mg/dL (ref 65–99)

## 2015-09-15 LAB — CK: CK TOTAL: 1116 U/L — AB (ref 49–397)

## 2015-09-15 MED ORDER — FUROSEMIDE 10 MG/ML IJ SOLN
INTRAMUSCULAR | Status: AC
  Filled 2015-09-15: qty 4

## 2015-09-15 MED ORDER — SODIUM POLYSTYRENE SULFONATE 15 GM/60ML PO SUSP
15.0000 g | Freq: Once | ORAL | Status: AC
  Administered 2015-09-15: 15 g via ORAL
  Filled 2015-09-15: qty 60

## 2015-09-15 MED ORDER — FUROSEMIDE 10 MG/ML IJ SOLN
40.0000 mg | Freq: Once | INTRAMUSCULAR | Status: AC
  Administered 2015-09-15: 40 mg via INTRAVENOUS

## 2015-09-15 MED ORDER — METHYLPREDNISOLONE SODIUM SUCC 125 MG IJ SOLR
125.0000 mg | Freq: Three times a day (TID) | INTRAMUSCULAR | Status: DC
  Administered 2015-09-15 – 2015-09-16 (×4): 125 mg via INTRAVENOUS
  Filled 2015-09-15 (×4): qty 2

## 2015-09-15 MED ORDER — METOPROLOL TARTRATE 1 MG/ML IV SOLN
5.0000 mg | Freq: Once | INTRAVENOUS | Status: AC
  Administered 2015-09-15: 5 mg via INTRAVENOUS

## 2015-09-15 MED ORDER — FUROSEMIDE 10 MG/ML IJ SOLN
5.0000 mg/h | INTRAVENOUS | Status: DC
  Administered 2015-09-15: 10 mg/h via INTRAVENOUS
  Filled 2015-09-15: qty 25

## 2015-09-15 MED ORDER — METOPROLOL TARTRATE 1 MG/ML IV SOLN
INTRAVENOUS | Status: AC
  Filled 2015-09-15: qty 5

## 2015-09-15 MED ORDER — MILRINONE IN DEXTROSE 20 MG/100ML IV SOLN
0.1250 ug/kg/min | INTRAVENOUS | Status: DC
  Administered 2015-09-15 – 2015-09-16 (×3): 0.25 ug/kg/min via INTRAVENOUS
  Filled 2015-09-15 (×3): qty 100

## 2015-09-15 MED ORDER — FUROSEMIDE 10 MG/ML IJ SOLN
40.0000 mg | Freq: Once | INTRAMUSCULAR | Status: AC
Start: 2015-09-15 — End: 2015-09-15
  Administered 2015-09-15: 40 mg via INTRAVENOUS
  Filled 2015-09-15: qty 4

## 2015-09-15 MED ORDER — CETYLPYRIDINIUM CHLORIDE 0.05 % MT LIQD
7.0000 mL | Freq: Two times a day (BID) | OROMUCOSAL | Status: DC
  Administered 2015-09-16 – 2015-10-01 (×18): 7 mL via OROMUCOSAL

## 2015-09-15 NOTE — Progress Notes (Signed)
eLink Physician-Brief Progress Note Patient Name: James Bennett DOB: April 07, 1996 MRN: [id number]   Date of Service  09/15/2015  HPI/Events of Note  Patient is on a Lasix IV infusion at 10 mg/hour. Nurse states that he has put out 6 liters of urine since about 4:30 PM. CVP = 5.  eICU Interventions  Will decrease Lasix IV infusion to 5 mg/hour and follow urine output and CVP closely.      Intervention Category Major Interventions: Other:  Lenell Antu 09/15/2015, 10:20 PM

## 2015-09-15 NOTE — Progress Notes (Signed)
VASCULAR LAB PRELIMINARY  PRELIMINARY  PRELIMINARY  PRELIMINARY  Bilateral lower extremity arterial duplex completed.    Preliminary report:  Bilateral -  Technically difficult due to swelling, respiratory interference and interference from the air mattress. Duplex scan revealed no evidence of stenosis or thrombosis.Unable to evaluate the Doppler signals due to technical difficulties listed above.  Thinh Cuccaro, RVS 09/15/2015, 4:56 PM

## 2015-09-15 NOTE — Consult Note (Signed)
[facility] Renal Consultation Note  Requesting MD: Ashok Cordia Indication for Consultation: Elevated creatinine and granular casts  HPI:  James Bennett is a 20 y.o. male with really no past medical history who presented to an urgent care with a 3 week history of shortness of breath and coughing. He had undergone rounds of antibiotics and prednisone with not much relief. He also was complaining of bilateral lower leg swelling and eventually what prompted him to come to attention were toes being dark in color.  Upon initial evaluation he was found to have a lowish blood pressure of 110- which did drop into the 90s- was found to have a creatinine initially of 1.17 which worsened to 1.3 and also an elevated white blood count at 20,000, elevated lactate at over 8 which has decreased. Urinalysis shows granular casts but really minimal protein and no hematuria. Patient has been hydrated aggressively. His urine output is marginal- then today, echo shows ejection fraction of 15% raising question of possible infectious cardiomyopathy? Has been started on milrinone. CK is 1100.  He appears acutely ill to my exam- short of breath on the Ventimask with a heart rate of 135 and low blood pressure   CREATININE, SER  Date/Time Value Ref Range Status  09/15/2015 02:43 AM 1.33* 0.61 - 1.24 mg/dL Final  09/14/2015 11:44 PM 1.35* 0.61 - 1.24 mg/dL Final  09/14/2015 04:50 PM 1.17 0.61 - 1.24 mg/dL Final     PMHx:   Past Medical History  Diagnosis Date  . Bronchitis     History reviewed. No pertinent past surgical history.  Family Hx:  Family History  Problem Relation Age of Onset  . Lupus Mother     Social History:  reports that he has never smoked. He does not have any smokeless tobacco history on file. He reports that he does not drink alcohol or use illicit drugs.  Allergies: No Known Allergies  Medications: Prior to Admission medications   Medication Sig Start Date End Date Taking?  Authorizing Provider  amoxicillin-clavulanate (AUGMENTIN) 875-125 MG tablet Take 1 tablet by mouth 2 (two) times daily.   Yes Historical Provider, MD  prednisoLONE (ORAPRED ODT) 10 MG disintegrating tablet Take 20 mg by mouth daily.   Yes Historical Provider, MD    I have reviewed the patient's current medications.  Labs:  Results for orders placed or performed during the hospital encounter of 09/14/15 (from the past 48 hour(s))  CBG monitoring, ED     Status: Abnormal   Collection Time: 09/14/15  3:47 PM  Result Value Ref Range   Glucose-Capillary 139 (H) 65 - 99 mg/dL   Comment 1 Notify RN    Comment 2 Document in Chart   Culture, blood (routine x 2)     Status: None (Preliminary result)   Collection Time: 09/14/15  3:48 PM  Result Value Ref Range   Specimen Description BLOOD LEFT FOREARM    Special Requests BOTTLES DRAWN AEROBIC AND ANAEROBIC 5CC EACH    Culture PENDING    Report Status PENDING   CBC with Differential     Status: Abnormal   Collection Time: 09/14/15  4:10 PM  Result Value Ref Range   WBC 20.0 (H) 4.0 - 10.5 K/uL   RBC 5.31 4.22 - 5.81 MIL/uL   Hemoglobin 13.9 13.0 - 17.0 g/dL   HCT 43.0 39.0 - 52.0 %   MCV 81.0 78.0 - 100.0 fL   MCH 26.2 26.0 - 34.0 pg   MCHC 32.3 30.0 -  36.0 g/dL   RDW 19.5 (H) 11.5 - 15.5 %   Platelets PLATELET CLUMPS NOTED ON SMEAR, UNABLE TO ESTIMATE 150 - 400 K/uL   Neutrophils Relative % 85 %   Lymphocytes Relative 6 %   Monocytes Relative 9 %   Eosinophils Relative 0 %   Basophils Relative 0 %   Neutro Abs 17.0 (H) 1.7 - 7.7 K/uL   Lymphs Abs 1.2 0.7 - 4.0 K/uL   Monocytes Absolute 1.8 (H) 0.1 - 1.0 K/uL   Eosinophils Absolute 0.0 0.0 - 0.7 K/uL   Basophils Absolute 0.0 0.0 - 0.1 K/uL   WBC Morphology TOXIC GRANULATION   Brain natriuretic peptide     Status: Abnormal   Collection Time: 09/14/15  4:10 PM  Result Value Ref Range   B Natriuretic Peptide 1312.6 (H) 0.0 - 100.0 pg/mL  I-Stat CG4 Lactic Acid, ED (Not at [facility])      Status: Abnormal   Collection Time: 09/14/15  4:18 PM  Result Value Ref Range   Lactic Acid, Venous 8.62 (HH) 0.5 - 2.0 mmol/L   Comment NOTIFIED PHYSICIAN   Culture, blood (routine x 2)     Status: None (Preliminary result)   Collection Time: 09/14/15  4:30 PM  Result Value Ref Range   Specimen Description BLOOD LEFT ANTECUBITAL    Special Requests BOTTLES DRAWN AEROBIC AND ANAEROBIC 6 CC EACH    Culture PENDING    Report Status PENDING   Procalcitonin     Status: None   Collection Time: 09/14/15  4:50 PM  Result Value Ref Range   Procalcitonin 2.32 ng/mL    Comment:        Interpretation: PCT > 2 ng/mL: Systemic infection (sepsis) is likely, unless other causes are known. (NOTE)         ICU PCT Algorithm               Non ICU PCT Algorithm    ----------------------------     ------------------------------         PCT < 0.25 ng/mL                 PCT < 0.1 ng/mL     Stopping of antibiotics            Stopping of antibiotics       strongly encouraged.               strongly encouraged.    ----------------------------     ------------------------------       PCT level decrease by               PCT < 0.25 ng/mL       >= 80% from peak PCT       OR PCT 0.25 - 0.5 ng/mL          Stopping of antibiotics                                             encouraged.     Stopping of antibiotics           encouraged.    ----------------------------     ------------------------------       PCT level decrease by              PCT >= 0.25 ng/mL       < 80% from peak PCT  AND PCT >= 0.5 ng/mL            Continuing antibiotics                                               encouraged.       Continuing antibiotics            encouraged.    ----------------------------     ------------------------------     PCT level increase compared          PCT > 0.5 ng/mL         with peak PCT AND          PCT >= 0.5 ng/mL             Escalation of antibiotics                                           strongly encouraged.      Escalation of antibiotics        strongly encouraged. Performed at [facility]   Comprehensive metabolic panel     Status: Abnormal   Collection Time: 09/14/15  4:50 PM  Result Value Ref Range   Sodium 132 (L) 135 - 145 mmol/L   Potassium 5.9 (H) 3.5 - 5.1 mmol/L    Comment: NO VISIBLE HEMOLYSIS   Chloride 101 101 - 111 mmol/L   CO2 16 (L) 22 - 32 mmol/L   Glucose, Bld 126 (H) 65 - 99 mg/dL   BUN 52 (H) 6 - 20 mg/dL   Creatinine, Ser 1.17 0.61 - 1.24 mg/dL   Calcium 8.1 (L) 8.9 - 10.3 mg/dL   Total Protein 6.2 (L) 6.5 - 8.1 g/dL   Albumin 2.7 (L) 3.5 - 5.0 g/dL   AST 77 (H) 15 - 41 U/L   ALT 66 (H) 17 - 63 U/L   Alkaline Phosphatase 82 38 - 126 U/L   Total Bilirubin 3.3 (H) 0.3 - 1.2 mg/dL   GFR calc non Af Amer >60 >60 mL/min   GFR calc Af Amer >60 >60 mL/min    Comment: (NOTE) The eGFR has been calculated using the CKD EPI equation. This calculation has not been validated in all clinical situations. eGFR's persistently <60 mL/min signify possible Chronic Kidney Disease.    Anion gap 15 5 - 15  APTT     Status: None   Collection Time: 09/14/15  4:50 PM  Result Value Ref Range   aPTT 32 24 - 37 seconds  Protime-INR     Status: Abnormal   Collection Time: 09/14/15  4:50 PM  Result Value Ref Range   Prothrombin Time 22.2 (H) 11.6 - 15.2 seconds   INR 1.96 (H) 0.00 - 1.49  MRSA PCR Screening     Status: None   Collection Time: 09/14/15  9:45 PM  Result Value Ref Range   MRSA by PCR NEGATIVE NEGATIVE    Comment:        The GeneXpert MRSA Assay (FDA approved for NASAL specimens only), is one component of a comprehensive MRSA colonization surveillance program. It is not intended to diagnose MRSA infection nor to guide or monitor treatment for MRSA infections.   Glucose, capillary     Status: None  Collection Time: 09/14/15 10:05 PM  Result Value Ref Range   Glucose-Capillary 84 65 - 99 mg/dL  Influenza panel by PCR (type A & B,  H1N1)     Status: None   Collection Time: 09/14/15 11:05 PM  Result Value Ref Range   Influenza A By PCR NEGATIVE NEGATIVE   Influenza B By PCR NEGATIVE NEGATIVE   H1N1 flu by pcr NOT DETECTED NOT DETECTED    Comment:        The Xpert Flu assay (FDA approved for nasal aspirates or washes and nasopharyngeal swab specimens), is intended as an aid in the diagnosis of influenza and should not be used as a sole basis for treatment.   Urinalysis, Routine w reflex microscopic (not at [facility])     Status: Abnormal   Collection Time: 09/14/15 11:32 PM  Result Value Ref Range   Color, Urine YELLOW YELLOW   APPearance CLOUDY (A) CLEAR   Specific Gravity, Urine 1.036 (H) 1.005 - 1.030   pH 5.0 5.0 - 8.0   Glucose, UA NEGATIVE NEGATIVE mg/dL   Hgb urine dipstick NEGATIVE NEGATIVE   Bilirubin Urine MODERATE (A) NEGATIVE   Ketones, ur 15 (A) NEGATIVE mg/dL   Protein, ur 30 (A) NEGATIVE mg/dL   Nitrite NEGATIVE NEGATIVE   Leukocytes, UA TRACE (A) NEGATIVE  Urine microscopic-add on     Status: Abnormal   Collection Time: 09/14/15 11:32 PM  Result Value Ref Range   Squamous Epithelial / LPF 0-5 (A) NONE SEEN   WBC, UA 0-5 0 - 5 WBC/hpf   RBC / HPF NONE SEEN 0 - 5 RBC/hpf   Bacteria, UA FEW (A) NONE SEEN   Casts GRANULAR CAST (A) NEGATIVE    Comment: HYALINE CASTS  Legionella antigen, urine (NOT AT [facility])     Status: None   Collection Time: 09/14/15 11:33 PM  Result Value Ref Range   Specimen Description URINE, CLEAN CATCH    Special Requests NONE    Legionella Antigen, Urine      Negative for Legionella pneumophila serogroup 1                                                              Legionella pneumophila serogroup 1 antigen can be detected in urine within 2 to 3 days of infection and may persist even after treatment. This  assay does not detect other Legionella species or serogroups. Performed at [facility]    Report Status 09/15/2015 FINAL   Strep pneumoniae urinary antigen      Status: None   Collection Time: 09/14/15 11:34 PM  Result Value Ref Range   Strep Pneumo Urinary Antigen NEGATIVE NEGATIVE    Comment:        Infection due to S. pneumoniae cannot be absolutely ruled out since the antigen present may be below the detection limit of the test.   Sedimentation rate     Status: None   Collection Time: 09/14/15 11:44 PM  Result Value Ref Range   Sed Rate 1 0 - 16 mm/hr  C-reactive protein     Status: Abnormal   Collection Time: 09/14/15 11:44 PM  Result Value Ref Range   CRP 14.1 (H) <1.0 mg/dL  CBC     Status: Abnormal   Collection Time: 09/14/15  11:44 PM  Result Value Ref Range   WBC 18.0 (H) 4.0 - 10.5 K/uL   RBC 4.92 4.22 - 5.81 MIL/uL   Hemoglobin 12.9 (L) 13.0 - 17.0 g/dL   HCT 40.8 39.0 - 52.0 %   MCV 82.9 78.0 - 100.0 fL   MCH 26.2 26.0 - 34.0 pg   MCHC 31.6 30.0 - 36.0 g/dL   RDW 17.6 (H) 11.5 - 15.5 %   Platelets 162 150 - 400 K/uL  Lactic acid, plasma     Status: Abnormal   Collection Time: 09/14/15 11:44 PM  Result Value Ref Range   Lactic Acid, Venous 6.4 (HH) 0.5 - 2.0 mmol/L    Comment: CRITICAL RESULT CALLED TO, READ BACK BY AND VERIFIED WITH: HARDUK G,RN 09/15/15 0025 WAYK   Troponin I     Status: Abnormal   Collection Time: 09/14/15 11:44 PM  Result Value Ref Range   Troponin I 0.05 (H) <0.031 ng/mL    Comment:        PERSISTENTLY INCREASED TROPONIN VALUES IN THE RANGE OF 0.04-0.49 ng/mL CAN BE SEEN IN:       -UNSTABLE ANGINA       -CONGESTIVE HEART FAILURE       -MYOCARDITIS       -CHEST TRAUMA       -ARRYHTHMIAS       -LATE PRESENTING MYOCARDIAL INFARCTION       -COPD   CLINICAL FOLLOW-UP RECOMMENDED.   Magnesium     Status: None   Collection Time: 09/14/15 11:44 PM  Result Value Ref Range   Magnesium 2.1 1.7 - 2.4 mg/dL  Phosphorus     Status: Abnormal   Collection Time: 09/14/15 11:44 PM  Result Value Ref Range   Phosphorus 5.3 (H) 2.5 - 4.6 mg/dL  Comprehensive metabolic panel     Status: Abnormal    Collection Time: 09/14/15 11:44 PM  Result Value Ref Range   Sodium 133 (L) 135 - 145 mmol/L   Potassium 6.6 (HH) 3.5 - 5.1 mmol/L    Comment: SLIGHT HEMOLYSIS CRITICAL RESULT CALLED TO, READ BACK BY AND VERIFIED WITH: HARDUK G,RN 09/15/15 0047 WAYK    Chloride 100 (L) 101 - 111 mmol/L   CO2 13 (L) 22 - 32 mmol/L   Glucose, Bld 82 65 - 99 mg/dL   BUN 54 (H) 6 - 20 mg/dL   Creatinine, Ser 1.35 (H) 0.61 - 1.24 mg/dL   Calcium 8.7 (L) 8.9 - 10.3 mg/dL   Total Protein 6.2 (L) 6.5 - 8.1 g/dL   Albumin 2.6 (L) 3.5 - 5.0 g/dL   AST 78 (H) 15 - 41 U/L   ALT 66 (H) 17 - 63 U/L   Alkaline Phosphatase 79 38 - 126 U/L   Total Bilirubin 3.1 (H) 0.3 - 1.2 mg/dL   GFR calc non Af Amer >60 >60 mL/min   GFR calc Af Amer >60 >60 mL/min    Comment: (NOTE) The eGFR has been calculated using the CKD EPI equation. This calculation has not been validated in all clinical situations. eGFR's persistently <60 mL/min signify possible Chronic Kidney Disease.    Anion gap 20 (H) 5 - 15  TSH     Status: None   Collection Time: 09/14/15 11:44 PM  Result Value Ref Range   TSH 3.110 0.350 - 4.500 uIU/mL  CBC     Status: Abnormal   Collection Time: 09/15/15  2:43 AM  Result Value Ref Range   WBC 17.8 (H) 4.0 -  10.5 K/uL   RBC 4.75 4.22 - 5.81 MIL/uL   Hemoglobin 12.5 (L) 13.0 - 17.0 g/dL   HCT 39.1 39.0 - 52.0 %   MCV 82.3 78.0 - 100.0 fL   MCH 26.3 26.0 - 34.0 pg   MCHC 32.0 30.0 - 36.0 g/dL   RDW 17.4 (H) 11.5 - 15.5 %   Platelets 141 (L) 150 - 400 K/uL  Comprehensive metabolic panel     Status: Abnormal   Collection Time: 09/15/15  2:43 AM  Result Value Ref Range   Sodium 133 (L) 135 - 145 mmol/L   Potassium 5.6 (H) 3.5 - 5.1 mmol/L   Chloride 100 (L) 101 - 111 mmol/L   CO2 19 (L) 22 - 32 mmol/L   Glucose, Bld 110 (H) 65 - 99 mg/dL   BUN 50 (H) 6 - 20 mg/dL   Creatinine, Ser 1.33 (H) 0.61 - 1.24 mg/dL   Calcium 8.5 (L) 8.9 - 10.3 mg/dL   Total Protein 5.7 (L) 6.5 - 8.1 g/dL   Albumin 2.3 (L)  3.5 - 5.0 g/dL   AST 66 (H) 15 - 41 U/L   ALT 60 17 - 63 U/L   Alkaline Phosphatase 71 38 - 126 U/L   Total Bilirubin 3.3 (H) 0.3 - 1.2 mg/dL   GFR calc non Af Amer >60 >60 mL/min   GFR calc Af Amer >60 >60 mL/min    Comment: (NOTE) The eGFR has been calculated using the CKD EPI equation. This calculation has not been validated in all clinical situations. eGFR's persistently <60 mL/min signify possible Chronic Kidney Disease.    Anion gap 14 5 - 15  Lactic acid, plasma     Status: Abnormal   Collection Time: 09/15/15  2:43 AM  Result Value Ref Range   Lactic Acid, Venous 3.7 (HH) 0.5 - 2.0 mmol/L    Comment: CRITICAL RESULT CALLED TO, READ BACK BY AND VERIFIED WITH: HARDUK G,RN 09/15/15 0325 WAYK   Protime-INR     Status: Abnormal   Collection Time: 09/15/15  2:43 AM  Result Value Ref Range   Prothrombin Time 23.3 (H) 11.6 - 15.2 seconds   INR 2.09 (H) 0.00 - 1.49  APTT     Status: None   Collection Time: 09/15/15  2:43 AM  Result Value Ref Range   aPTT 31 24 - 37 seconds  Magnesium     Status: None   Collection Time: 09/15/15  2:43 AM  Result Value Ref Range   Magnesium 1.8 1.7 - 2.4 mg/dL  Procalcitonin     Status: None   Collection Time: 09/15/15  2:43 AM  Result Value Ref Range   Procalcitonin 3.56 ng/mL    Comment:        Interpretation: PCT > 2 ng/mL: Systemic infection (sepsis) is likely, unless other causes are known. (NOTE)         ICU PCT Algorithm               Non ICU PCT Algorithm    ----------------------------     ------------------------------         PCT < 0.25 ng/mL                 PCT < 0.1 ng/mL     Stopping of antibiotics            Stopping of antibiotics       strongly encouraged.  strongly encouraged.    ----------------------------     ------------------------------       PCT level decrease by               PCT < 0.25 ng/mL       >= 80% from peak PCT       OR PCT 0.25 - 0.5 ng/mL          Stopping of antibiotics                                              encouraged.     Stopping of antibiotics           encouraged.    ----------------------------     ------------------------------       PCT level decrease by              PCT >= 0.25 ng/mL       < 80% from peak PCT        AND PCT >= 0.5 ng/mL            Continuing antibiotics                                               encouraged.       Continuing antibiotics            encouraged.    ----------------------------     ------------------------------     PCT level increase compared          PCT > 0.5 ng/mL         with peak PCT AND          PCT >= 0.5 ng/mL             Escalation of antibiotics                                          strongly encouraged.      Escalation of antibiotics        strongly encouraged.   Phosphorus     Status: None   Collection Time: 09/15/15  2:43 AM  Result Value Ref Range   Phosphorus 4.4 2.5 - 4.6 mg/dL  CK     Status: Abnormal   Collection Time: 09/15/15 10:59 AM  Result Value Ref Range   Total CK 1116 (H) 49 - 397 U/L  Bilirubin, fractionated(tot/dir/indir)     Status: Abnormal   Collection Time: 09/15/15 10:59 AM  Result Value Ref Range   Total Bilirubin 4.3 (H) 0.3 - 1.2 mg/dL   Bilirubin, Direct 2.2 (H) 0.1 - 0.5 mg/dL   Indirect Bilirubin 2.1 (H) 0.3 - 0.9 mg/dL  Troponin I     Status: Abnormal   Collection Time: 09/15/15 10:59 AM  Result Value Ref Range   Troponin I 0.08 (H) <0.031 ng/mL    Comment:        PERSISTENTLY INCREASED TROPONIN VALUES IN THE RANGE OF 0.04-0.49 ng/mL CAN BE SEEN IN:       -UNSTABLE ANGINA       -CONGESTIVE HEART FAILURE       -MYOCARDITIS       -  CHEST TRAUMA       -ARRYHTHMIAS       -LATE PRESENTING MYOCARDIAL INFARCTION       -COPD   CLINICAL FOLLOW-UP RECOMMENDED.   Glucose, capillary     Status: None   Collection Time: 09/15/15 11:41 AM  Result Value Ref Range   Glucose-Capillary 91 65 - 99 mg/dL     ROS:  A comprehensive review of systems was negative except for:  Cardiovascular: positive for dyspnea and lower extremity edema  Physical Exam: Filed Vitals:   09/15/15 1200 09/15/15 1300  BP: 100/73 84/70  Pulse: 126 135  Temp:    Resp: 32 29     General: Obese, acutely ill white male with rapid respiratory rate and tachycardia riving around a little in bed HEENT: Pupils are equal round reactive to light, extraocular motions are intact, mucous membranes are moist Neck: Difficult to tell JVD Heart: Tachycardic Lungs: Coarse breath sounds bilaterally Abdomen: Soft, nondistended Extremities: Pitting edema throughout the lower extremities Skin: Warm and dry Neuro: Slightly confused possibly  Assessment/Plan: 20 year old white male presenting with cough, shortness of breath and discolored toes. Evaluation has found an ejection fraction of 15%. He also has some renal insufficiency and marginal urine output 1.Renal- elevated creatinine and marginal urine output in the setting of a newly diagnosed cardiomyopathy.  Urinalysis really only shows granular casts- no hematuria and minimal proteinuria so don't feel like it's in intra-renal process.  I suspect it is a cardiorenal type process.  Cardiology is seeing patient and he has now been started on milrinone. Hopefully, this will lead to improved cardiac output and better renal perfusion. Unfortunately, I think is entirely possible this patient would require CRRT before all is said and done 2. Hypertension/volume  - is volume overloaded but hypotensive due to his cardiomyopathy. Now on milrinone. Has been given furosemide 80 mg IV with minimal response. This is a patient I  might actually consider Lasix drip.  3. Hyperkalemia- due to acute kidney injury and also high-dose steroids. Supportive care for now 4. Anemia  - not an issue at this time  Thank you for this consultation. We will continue to follow closely with you   Kamika Goodloe A 09/15/2015, 1:41 PM

## 2015-09-15 NOTE — Progress Notes (Signed)
Called stat to patient's room for SOB, increased WOB, diaphoresis, cyanotic.  Placed patient on 100% NRB followed by bipap 12/6 BUR 12, 100% large FFM.  Patient became nauseaus shortly after being on BIPAP removed and placed back on 100% NRB.  O2 sats came back up to 100% and decreased WOB and SOB.

## 2015-09-15 NOTE — Progress Notes (Signed)
  Echocardiogram 2D Echocardiogram with Definity 3 mL has been performed.  Leta Jungling M 09/15/2015, 9:08 AM

## 2015-09-15 NOTE — Progress Notes (Signed)
After patient went to bedside commode, HR sustained in 140. MD at the bedside, 5 mg Lopressor given.Sitted in the chair. Pt started to feel even "worse". MD called, Pt placed in the bed, SOB, pale, sweating.  RT called stat, MD at the bedside Bipap was placed on, Lasix 80 mg given, Foley inserted as ordered. Pt did not tolerated Bipap, placed back to [place], breathing slightly better. Consent and education given to family for Central line insertion. Waiting on Pt breathing improvement.

## 2015-09-15 NOTE — Consult Note (Signed)
[facility] NOTE   MRN : [id number]  Reason for Consult: ischemic changes to bilateral feet Referring Physician: Dr. Vassie Loll  History of Present Illness: 20 y/o male with 3 week history of SOB seen by his PCP 3 weeks ago and diagnosed with acute bronchitis. Pt was placed on Azithromycin and Prednisone without much relief. He was seen again last week and placed on Levaquin. Pt was seen again a couple of days ago and placed on Amoxicillin and a second round of Prednisone.  He has had to sleep sitting up in a chair.  He states 5 days ago his toes started turning blue.  He is not having any pain in his feet.  His parents are at bed side.  No other past medical history.       Current Facility-Administered Medications  Medication Dose Route Frequency Provider Last Rate Last Dose  . 0.9 %  sodium chloride infusion  250 mL Intravenous PRN Pincus Large, DO      . 0.9 %  sodium chloride infusion   Intravenous Continuous Pincus Large, DO 10 mL/hr at 09/15/15 1125    . famotidine (PEPCID) tablet 20 mg  20 mg Oral BID Pincus Large, DO   20 mg at 09/15/15 0940  . methylPREDNISolone sodium succinate (SOLU-MEDROL) 125 mg/2 mL injection 125 mg  125 mg Intravenous Q8H Cyril Mourning V, MD      . milrinone (PRIMACOR) 20 MG/100ML (0.2 mg/mL) infusion  0.25 mcg/kg/min Intravenous Continuous Cyril Mourning V, MD 11.3 mL/hr at 09/15/15 1149 0.25 mcg/kg/min at 09/15/15 1149  . piperacillin-tazobactam (ZOSYN) IVPB 3.375 g  3.375 g Intravenous Q8H Ann Held, RPH 12.5 mL/hr at 09/15/15 0940 3.375 g at 09/15/15 0940  . vancomycin (VANCOCIN) 1,500 mg in sodium chloride 0.9 % 500 mL IVPB  1,500 mg Intravenous Q8H Ann Held, RPH 250 mL/hr at 09/15/15 1016 1,500 mg at 09/15/15 1016  . vancomycin (VANCOCIN) 500 mg in sodium chloride 0.9 % 100 mL IVPB  500 mg Intravenous Once Ann Held, [facility]   Stopped at 09/14/15 2051    Pt meds include: Statin :No Betablocker:  No ASA: No Other anticoagulants/antiplatelets: none  Past Medical History  Diagnosis Date  . Bronchitis     History reviewed. No pertinent past surgical history.  Social History Social History  Substance Use Topics  . Smoking status: Never Smoker   . Smokeless tobacco: None  . Alcohol Use: No    Family History Family History  Problem Relation Age of Onset  . Lupus Mother     No Known Allergies   REVIEW OF SYSTEMS  General:  Weight loss,  Fever,  chills Neurologic:  Dizziness,  Blackouts,  Seizure  Stroke,  "Mini stroke",  Slurred speech,  Temporary blindness;  weakness in arms or legs,  Hoarseness  Dysphagia Cardiac:  Chest pain/pressure, [x ] Shortness of breath at rest  Shortness of breath with exertion,  Atrial fibrillation or irregular heartbeat  Vascular:  Pain in legs with walking,  Pain in legs at rest,  Pain in legs at night,   Non-healing ulcer,  Blood clot in vein/DVT,   Pulmonary:  Home oxygen,  Productive cough,  Coughing up blood,  Asthma,   Wheezing  COPD Musculoskeletal:   Arthritis,  Low back  pain, [ ]  Joint pain Hematologic: [ ]  Easy Bruising, [ ]  Anemia; [ ]  Hepatitis Gastrointestinal: [ ]  Blood in stool, [ ]  Gastroesophageal Reflux/heartburn, Urinary: [ ]  chronic Kidney disease, [ ]  on HD - [ ]  MWF or [ ]  TTHS, [ ]  Burning with urination, [ ]  Difficulty urinating Skin: [x ] Rashes, [ ]  Wounds Psychological: [ ]  Anxiety, [ ]  Depression  Physical Examination Filed Vitals:   09/15/15 0917 09/15/15 1000 09/15/15 1100 09/15/15 1200  BP:  104/65 121/83 100/73  Pulse:  133 141 126  Temp: 97.7 F (36.5 C)     TempSrc: Oral     Resp:  17 22 32  Height:      Weight:      SpO2:  96% 97% 97%   Body mass index is 42.48 kg/(m^2).  General:   Gait: Normal HENT: WNL Eyes: Pupils equal Pulmonary:  labored breathing , without Rales, rhonchi,  wheezing Cardiac: RRR,  without  Murmurs, rubs or gallops; No carotid bruits Abdomen: soft, NT, no masses Skin: no rashes, ulcers noted;  no Gangrene , no cellulitis; no open wounds; Ischemic changes to all 10 toes.  Vascular Exam/Pulses:Palpable bilateral femoral pulses, non palpable distal pulses.  Active range of motion of all 4 extremities with sensation intact.     Musculoskeletal: no muscle wasting or atrophy; positive edema General   Neurologic: A&O X 3; Appropriate Affect ;  SENSATION: normal; MOTOR FUNCTION: 5/5 Symmetric Speech is fluent/normal   Significant Diagnostic Studies: CBC Lab Results  Component Value Date   WBC 17.8* 09/15/2015   HGB 12.5* 09/15/2015   HCT 39.1 09/15/2015   MCV 82.3 09/15/2015   PLT 141* 09/15/2015    BMET    Component Value Date/Time   NA 133* 09/15/2015 0243   K 5.6* 09/15/2015 0243   CL 100* 09/15/2015 0243   CO2 19* 09/15/2015 0243   GLUCOSE 110* 09/15/2015 0243   BUN 50* 09/15/2015 0243   CREATININE 1.33* 09/15/2015 0243   CALCIUM 8.5* 09/15/2015 0243   GFRNONAA >60 09/15/2015 0243   GFRAA >60 09/15/2015 0243   Estimated Creatinine Clearance: 137.1 mL/min (by C-G formula based on Cr of 1.33).  COAG Lab Results  Component Value Date   INR 2.09* 09/15/2015   INR 1.96* 09/14/2015   ECHO Left ventricle: The cavity size was severely dilated. Systolic function was severely reduced. The estimated ejection fraction was 15%. Severe diffuse hypokinesis.    Non-Invasive Vascular Imaging: pending arterial duplexes  ASSESSMENT/PLAN:  Ischemic changes to all 10 toes.   Petechiae anterior LE bilaterally Working diagnosis viral cardiomyopathy BUN 50, Liver function study AST increased 66-70, ALT 6.2-5.7, creatinine 1.33.  He has been given IV saline boluses, IV antibiotics, and O2 for support.  Will discuss case with Dr. Diamond Nickel, EMMA [facility] 09/15/2015 12:31 PM   I agree with the above.  I have seen and examined the patient.  He  has acute CHF with an EF of 15% of unknown etiology, possibly secondary to viral or vasculitis. I was asked to evaluate his ischemic appearing toes.  He has all 10 toes with darkening appearance, worrisome for ischemia, likly secondary to low output heart failure.  Doppler studies were normal.  No vascular intervention is warented.  He may benefit from heparinization, although he is currently auto anticoagulated given his associated hepatic insufficiency.  I discussed this with his mother.  I will continue to evaluate his extremities.  He may require amputation,  however I would hope that with improvement of his underlying condition that his toes improve.  Currently he has intact motor and sensory function of both feet.  I will continue to follow along with you.  Durene Cal

## 2015-09-15 NOTE — Consult Note (Addendum)
Advanced Heart Failure Team Consult Note  Referring Physician: Dr Elsworth Soho  Primary Physician: Primary Cardiologist: None    Reason for Consultation: Acute Systolic Heart Failure   HPI:   James Bennett is 20 year with no medical history other than recent bronchitis. Around the first of the year he developed at cough. About 3 weeks ago he was evaluated by his PCP treated for acute bronchitis with Azithromycin and Prednisone but had no relief. A few days ago he went back to PCP and he was placed on amoxicillin + prednisone. Moms says he was SOB with exertion and unable to sleep in a bed due to dyspnea. Yesterday he noticed increased leg edema with discoloration to his toes. He presented to [place]. He was tachycardic, lactic acid was 8.6, and WBC 20. CXR with multilobar PNA so he was admitted to [facility]M. Placed on vanc and zosyn. Blood and urine cultures obtained. Lower extremity dopplers obtained without evidence of DVT.  Today he developed increased dyspnea getting OOB. Required BiPap.    Pertinent labs on admit: Negative flu, troponin 0.05,  Lactic Acid 8.62, WBC 20, Hgb 12.9, K 6.6, AST 78, ALT 66, Albumin 2.6, Na 133., CRP 14.1, Sed Rate 1 , BP 1312     ECHO 09/15/2015 -EF 15% IVC dilated, RV mildly decreased.   CT Chest -09/15/15  1. Fluffy bilateral airspace opacification, most prominent at the lower lung lobes. Trace bilateral pleural effusions seen. Findings compatible with bilateral pneumonia. 2. Cannot exclude underlying septic emboli, given the peripheral distribution of airspace opacities. 3. 1.3 cm precarinal node may reflect the underlying infection. Four diffuse soft tissue swelling along the right axilla and right lateral chest wall, with vague soft tissue inflammation along the central chest wall. Mild soft tissue edema about the upper abdomen. 4. Trace ascites noted about the liver and spleen. 5. Mild bilateral gynecomastia noted.  Blood Cultures  Urine Cultures   FH:  Mom RA and Lupus       Grandmother HF  SH: Does not smoke or drink alcohol.    Review of Systems: [y] = yes, [ ]  = no   General: Weight gain [ ] ; Weight loss [ ] ; Anorexia [ ] ; Fatigue [Y ]; Fever [ ] ; Chills [ ] ; Weakness [Y ]  Cardiac: Chest pain/pressure [ ] ; Resting SOB [ ] ; Exertional SOB [Y ]; Orthopnea Jazmín.Cullens ]; Pedal Edema [Y ]; Palpitations [ ] ; Syncope [ ] ; Presyncope [ ] ; Paroxysmal nocturnal dyspnea[ ]   Pulmonary: Cough Jazmín.Cullens ]; Wheezing[ ] ; Hemoptysis[ ] ; Sputum [ ] ; Snoring [ ]   GI: Vomiting[ ] ; Dysphagia[ ] ; Melena[ ] ; Hematochezia [ ] ; Heartburn[ ] ; Abdominal pain [ ] ; Constipation [ ] ; Diarrhea [ ] ; BRBPR [ ]   GU: Hematuria[ ] ; Dysuria [ ] ; Nocturia[ ]   Vascular: Pain in legs with walking [Y ]; Pain in feet with lying flat [ ] ; Non-healing sores [ ] ; Stroke [ ] ; TIA [ ] ; Slurred speech [ ] ;  Neuro: Headaches[ ] ; Vertigo[ ] ; Seizures[ ] ; Paresthesias[ ] ;Blurred vision [ ] ; Diplopia [ ] ; Vision changes [ ]   Ortho/Skin: Arthritis [ ] ; Joint pain [ ] ; Muscle pain [ ] ; Joint swelling [ ] ; Back Pain [ ] ; Rash [ ]   Psych: Depression[ ] ; Anxiety[ ]   Heme: Bleeding problems [ ] ; Clotting disorders [ ] ; Anemia [ ]   Endocrine: Diabetes [ ] ; Thyroid dysfunction[ ]   Home Medications Prior to Admission medications   Medication Sig Start Date End Date Taking? Authorizing Provider  amoxicillin-clavulanate (AUGMENTIN)  875-125 MG tablet Take 1 tablet by mouth 2 (two) times daily.   Yes Historical Provider, MD  prednisoLONE (ORAPRED ODT) 10 MG disintegrating tablet Take 20 mg by mouth daily.   Yes Historical Provider, MD    Past Medical History: Past Medical History  Diagnosis Date  . Bronchitis     Past Surgical History: History reviewed. No pertinent past surgical history.  Family History: Family History  Problem Relation Age of Onset  . Lupus Mother     Social History: Social History   Social History  . Marital Status: Single    Spouse Name: N/A  . Number of Children: N/A  .  Years of Education: N/A   Social History Main Topics  . Smoking status: Never Smoker   . Smokeless tobacco: None  . Alcohol Use: No  . Drug Use: No  . Sexual Activity: Not Asked   Other Topics Concern  . 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 Bird exposure through his father's workplace of a small parrot. No mold exposure. No recent travel.    Allergies:  No Known Allergies  Objective:    Vital Signs:   Temp:  [97.4 F (36.3 C)-98.7 F (37.1 C)] 97.7 F (36.5 C) (02/21 0917) Pulse Rate:  [113-136] 133 (02/21 1000) Resp:  [12-28] 17 (02/21 1000) BP: (93-128)/(45-106) 104/65 mmHg (02/21 1000) SpO2:  [94 %-100 %] 96 % (02/21 1000) Weight:  [331 lb (150.141 kg)-343 lb (155.584 kg)] 331 lb (150.141 kg) (02/21 0500)    Weight change: Filed Weights   09/14/15 1636 09/14/15 2000 09/15/15 0500  Weight: 343 lb (155.584 kg) 331 lb (150.141 kg) 331 lb (150.141 kg)    Intake/Output:   Intake/Output Summary (Last 24 hours) at 09/15/15 1054 Last data filed at 09/15/15 1016  Gross per 24 hour  Intake   7660 ml  Output    750 ml  Net   6910 ml     Physical Exam: General:  Dyspneic at rest.  HEENT: normal Neck: supple. JVP to jaw . Carotids 2+ bilat; no bruits. No lymphadenopathy or thryomegaly appreciated. Cor: PMI nondisplaced. Tachy Regular rate & rhythm. No rubs, gallops or murmurs. Lungs: clear Abdomen: soft, nontender, nondistended. No hepatosplenomegaly. No bruits or masses. Good bowel sounds. Extremities: no cyanosis, clubbing, rash, R and LLE 3+ edema R and L toes purple,Unable to palpate pedal pulses.  Neuro: Alert & orientedx3, cranial nerves grossly intact. moves all 4 extremities w/o difficulty. Restless  Telemetry: Sinus Tach 120-130s.   Labs: Basic Metabolic Panel:  Recent Labs Lab 09/14/15 1650 09/14/15 2344 09/15/15 0243  NA 132* 133* 133*  K 5.9* 6.6* 5.6*  CL 101  100* 100*  CO2 16* 13* 19*  GLUCOSE 126* 82 110*  BUN 52* 54* 50*  CREATININE 1.17 1.35* 1.33*  CALCIUM 8.1* 8.7* 8.5*  MG  --  2.1 1.8  PHOS  --  5.3* 4.4    Liver Function Tests:  Recent Labs Lab 09/14/15 1650 09/14/15 2344 09/15/15 0243  AST 77* 78* 66*  ALT 66* 66* 60  ALKPHOS 82 79 71  BILITOT 3.3* 3.1* 3.3*  PROT 6.2* 6.2* 5.7*  ALBUMIN 2.7* 2.6* 2.3*   No results for input(s): LIPASE, AMYLASE in the last 168 hours. No results for input(s): AMMONIA in the last 168 hours.  CBC:  Recent Labs Lab 09/14/15 1610 09/14/15 2344 09/15/15 0243  WBC 20.0* 18.0* 17.8*  NEUTROABS 17.0*  --   --   HGB 13.9 12.9* 12.5*  HCT 43.0 40.8 39.1  MCV 81.0 82.9 82.3  PLT PLATELET CLUMPS NOTED ON SMEAR, UNABLE TO ESTIMATE 162 141*    Cardiac Enzymes:  Recent Labs Lab 09/14/15 2344  TROPONINI 0.05*    BNP: BNP (last 3 results)  Recent Labs  09/14/15 1610  BNP 1312.6*    ProBNP (last 3 results) No results for input(s): PROBNP in the last 8760 hours.   CBG:  Recent Labs Lab 09/14/15 1547 09/14/15 2205  GLUCAP 139* 84    Coagulation Studies:  Recent Labs  09/14/15 1650 09/15/15 0243  LABPROT 22.2* 23.3*  INR 1.96* 2.09*    Other results: EKG:.Sinus Tach 135 bpm   Imaging: Ct Chest Wo Contrast  09/15/2015  CLINICAL DATA:  Subacute onset of cough. Sepsis, and body rash. Initial encounter. EXAM: CT CHEST WITHOUT CONTRAST TECHNIQUE: Multidetector CT imaging of the chest was performed following the standard protocol without IV contrast. COMPARISON:  Chest radiograph performed 09/14/2015 FINDINGS: Fluffy bilateral airspace opacification is noted, most prominent at the lower lung lobes. Trace bilateral pleural effusions are seen. Findings are compatible with bilateral pneumonia. Underlying septic emboli cannot be excluded, given the peripheral distribution. No dominant mass is seen. No pneumothorax is identified. A 1.3 cm precarinal node is noted. No  additional mediastinal lymphadenopathy is seen. No pericardial effusion is identified. Residual thymic tissue is grossly unremarkable. The visualized portions of thyroid gland are unremarkable. No axillary lymphadenopathy is seen. Vague soft tissue inflammation is noted tracking along the central chest wall, of uncertain significance. Diffuse soft tissue swelling is also noted tracking along the right axilla and right lateral chest wall. Mild underlying soft tissue edema is noted about the upper abdomen. Mild bilateral gynecomastia is noted. Trace ascites is noted surrounding the liver and spleen. The visualized portions of the liver and spleen are grossly unremarkable. The visualized portions of the pancreas, adrenal glands and kidneys are within normal limits. No acute osseous abnormalities are identified. IMPRESSION: 1. Fluffy bilateral airspace opacification, most prominent at the lower lung lobes. Trace bilateral pleural effusions seen. Findings compatible with bilateral pneumonia. 2. Cannot exclude underlying septic emboli, given the peripheral distribution of airspace opacities. 3. 1.3 cm precarinal node may reflect the underlying infection. Four diffuse soft tissue swelling along the right axilla and right lateral chest wall, with vague soft tissue inflammation along the central chest wall. Mild soft tissue edema about the upper abdomen. 4. Trace ascites noted about the liver and spleen. 5. Mild bilateral gynecomastia noted. Electronically Signed   By: Garald Balding M.D.   On: 09/15/2015 00:47   US Venous Img Lower Bilateral  09/14/2015  CLINICAL DATA:  bilateral lower extremity swelling x1 0.5 weeks EXAM: BILATERAL LOWER EXTREMITY VENOUS DOPPLER ULTRASOUND TECHNIQUE: Gray-scale sonography with compression, as well as color and duplex ultrasound, were performed to evaluate the deep venous system from the level of the common femoral vein through the popliteal and proximal calf veins. COMPARISON:  None  FINDINGS: Normal compressibility of the common femoral, superficial femoral, and popliteal veins, as well as the proximal calf veins. No filling defects to suggest DVT on grayscale or color Doppler imaging. Doppler waveforms show normal direction of venous flow, normal respiratory phasicity and response to augmentation. Marked subcutaneous edema right thigh, left ankle. Survey views of the contralateral common femoral vein are unremarkable. IMPRESSION: 1. No evidence of lower extremity deep vein thrombosis, BILATERALLY. Electronically Signed   By:  Lucrezia Europe M.D.   On: 09/14/2015 18:27   Dg Chest Port 1 View  09/14/2015  CLINICAL DATA:  20 year old with 2 week history of shortness of breath and 1-1/2 week history of bilateral lower extremity edema. Recent diagnosis of bronchitis. EXAM: PORTABLE CHEST 1 VIEW COMPARISON:  None. FINDINGS: Cardiac silhouette upper normal in size to slightly enlarged even allowing for AP portable technique. Airspace consolidation in the right upper lobe, right lower lobe, and lingula. No pleural effusions. Normal pulmonary vascularity. IMPRESSION: 1. Multilobar pneumonia involving the right upper lobe, right lower lobe and lingula. 2. Mild cardiomegaly. Electronically Signed   By: Evangeline Dakin M.D.   On: 09/14/2015 17:17      Medications:     Current Medications: . famotidine  20 mg Oral BID  . piperacillin-tazobactam (ZOSYN)  IV  3.375 g Intravenous Q8H  . vancomycin  1,500 mg Intravenous Q8H  . vancomycin  500 mg Intravenous Once     Infusions: . sodium chloride 75 mL/hr at 09/15/15 0937      Assessment/Plan/Discussion   1. Acute Respiratory Failure- Increased WOB. Started on Bipap.   2. Sepsis 2/2 ? CAP On Vanc/Zosyn. WBC today coming down.  Blood and Urine Cultures pending.  3. Multilobal CAP vs Pneumonitis vs autoimmune process Negative Flu . On Vanc and Zosyn Autoimmune labs pending.  4. . Acute Systolic Heart Failure  New onset. ?Viral versus  autoimmune. ECHO today EF 15% RV mildly reduced with dilated IVC. Hepatitis panel  Pending. TSH normal. HIV pending. Volume status elevated. Give 80 mg IV lasix then start 80 mg IV twice a day. No bb or  Suspect low output. Add 0.25 mcg milrinone.  CCM to place central access. Plan to follow CVP and CO-OX. Set up for RHC tomorrow + or - swan.  5. Elevated LFTs- ? Shock 6. Ischemic Toes- [place]  negative DVT. Vascular consulted.  7. Hypoalbuminemia - Albumin 2.3   8. Obesity  Length of Stay: 1  Amy Clegg NP-C  09/15/2015, 10:54 AM  Advanced Heart Failure Team Pager [phone] (M-F; 7a - 4p)  Please contact [facility] for night-coverage after hours (4p -7a ) and weekends on [email or web address]  Patient seen with NP, agree with the above note.  He is more stable this afternoon, now on milrinone gtt + Lasix gtt at 10 mg/hr.  Good UOP.  He is also on Solumedrol with concern for possible autoimmune process and broad spectrum antibiotics.  1. Acute systolic CHF: EF 30% by echo with mildly dilated and mildly dysfunctional RV.  Cool extremities with sinus tachycardiac and elevated lactate. Concern for low output HF.  He is also markedly volume overloaded.  I am concerned for viral myocarditis, has had viral-type symptoms for several weeks and has had antibiotic courses at home prior to admission.  - He was started on milrinone 0.25 and seems to be doing better.  CVL placed, will get co-ox now.   - Continue Lasix gtt at 10 mg/hr, now with good UOP.   - Follow CVP by CVL.  - He will need RHC eventually, will reassess for possible cath tomorrow.  - Would like to eventually get cardiac MRI but may be too large for MRI.  2. Acute respiratory failure: Suspect pulmonary edema plays a role but cannot rule out autoimmune pneumonitis process.  He is off Bipap now with diuresis.  3. Rash: Petechial rash on lower legs, platelets not significantly low.  ?Vasculitis playing a role here.  4. Ischemic  toes: ?Low flow =>  think unlikely to be cardioembolic with symmetry.  No LV thrombus noted on echo. He has been started on heparin gtt in case of embolic cause.  Vascular to assess. 5. Elevated LFTs: Prominent elevated bilirubin.  Suspect this may be related to congestive hepatopathy.  6. ID: Covering with vancomycin/Zosyn, ? PNA component.  7. Rheum: ?Autoimmune process like vasculitis.  Elevated CRP, normal ESR.  Would send autoimmune serologies.  Agree with initiation of Solumedrol.  8. AKI: Evaluated by renal, do not think there is autoimmune process involving kidneys, probably cardiorenal situation.   Loralie Champagne 09/15/2015 4:46 PM

## 2015-09-15 NOTE — Procedures (Signed)
Central Venous Catheter Insertion Procedure Note James Bennett [id number] 1995/08/13  Procedure: Insertion of Central Venous Catheter Indications: Assessment of intravascular volume, Drug and/or fluid administration and Frequent blood sampling  Procedure Details Consent: Risks of procedure as well as the alternatives and risks of each were explained to the (patient/caregiver).  Consent for procedure obtained. Time Out: Verified patient identification, verified procedure, site/side was marked, verified correct patient position, special equipment/implants available, medications/allergies/relevent history reviewed, required imaging and test results available.  Performed  Maximum sterile technique was used including antiseptics, cap, gloves, gown, hand hygiene, mask and sheet. Skin prep: Chlorhexidine; local anesthetic administered A antimicrobial bonded/coated triple lumen catheter was placed in the left internal jugular vein using the Seldinger technique.  Evaluation Blood flow good Complications: No apparent complications Patient did tolerate procedure well. Chest X-ray ordered to verify placement.  CXR: pending.  Procedure performed under direct ultrasound guidance for real time vessel cannulation.      Rutherford Guys, [place] - C  Pulmonary & Critical Care Medicine Pager: [phone]  or [phone] 09/15/2015, 3:06 PM

## 2015-09-15 NOTE — Progress Notes (Signed)
VASCULAR LAB PRELIMINARY  ARTERIAL  ABI completed:    RIGHT    LEFT    PRESSURE WAVEFORM  PRESSURE WAVEFORM  BRACHIAL 100 Triphasic BRACHIAL 112 Triphasic  DP 119  DP 98   PT 97  PT 104     RIGHT LEFT  ABI 1.06 0.93   ABIs indicate normal arterial flow bilaterally at rest. Doppler waveforms could not be evaluated due to tachycardia, respiratory interference, and Interference from the air mattress on the bed. Unable to obtain great toe pressures due to the size of the toe and vibration from the bed  Mohmmad Saleeby, RVS 09/15/2015, 1:20 PM

## 2015-09-15 NOTE — Progress Notes (Signed)
PULMONARY / CRITICAL CARE MEDICINE   Name: James Bennett MRN: [id number] DOB: 23-Nov-1995    ADMISSION DATE:  09/14/2015 CONSULTATION DATE:  09/14/15  REFERRING MD:  Eustaquio Maize  CHIEF COMPLAINT:  Sepsis   HISTORY OF PRESENT ILLNESS:   James Bennett is a 20 y.o. male who presented to [place] with complaints of gradual onset, constant, worsening, bilateral lower leg swelling x 1 week. Pt reports that he was having shortness of breath and a dry cough since New Years (approximately 2.5 months ago). He was seen by his PCP 3 weeks ago and diagnosed with acute bronchitis. Pt was placed on Azithromycin and Prednisone without much relief. He was seen again last week and placed on Levaquin. Pt was seen again a couple of days ago and placed on Amoxicillin and a second round of Prednisone. He reports that his shortness of breath has since improved but he is still having a dry cough. Pt mentions that he has been lying around in his recliner for the past 2 weeks due to his bronchitis. He was unconcerned about his lower leg swelling until he noticed this morning that his toes were slightly blackened/purple in color. States that the color changes have been occuring over the last 3 days. Associated coolness to extremities appreciated. Pt states that he has been urinating normally but mentions an orange color to the urine since being on antibiotics.   Of note, patient has 6 dogs at home and one parrot he is exposed to at his dad's job. No recent travel. Mother has h/o Lupus and RA.    SUBJECTIVE:  Remains tachycardic. Resting comfortably in bed. On room air with normal BP. No overnight events  VITAL SIGNS: BP 104/65 mmHg  Pulse 133  Temp(Src) 97.7 F (36.5 C) (Oral)  Resp 17  Ht 6' 2"  (1.88 m)  Wt 331 lb (150.141 kg)  BMI 42.48 kg/m2  SpO2 96%  HEMODYNAMICS:    VENTILATOR SETTINGS:    INTAKE / OUTPUT: I/O last 3 completed shifts: In: 6433 [P.O.:1440; I.V.:5275; IV Piggyback:50] Out: 750  [Urine:750]  PHYSICAL EXAMINATION:  General:  19yo WM with NAD, resting comfortably in bed, awake Neuro:  A&Ox3, non-focal, follows commands. Sensation and strength intact HEENT: [place]/AT, dry MM, EOMI, no scleral icterus, O/P clear, no cervical adenopathy Cardiovascular:  Tachycardic, regular rhythm, no m/r/g appreciated Lungs:  CTAB, no w/r/r, normal work of breathing on room air. Abdomen:  Soft, non-tender, +BS, protuberant  Musculoskeletal:  3+ pitting edema BLE, R hand swelling. Moves all extremities.  Cyanotic toes bilaterally.  Skin: Petechiae appreciated on legs and abdomen. Warm and dry with coolness to bilateral toes.  LABS:  BMET  Recent Labs Lab 09/14/15 1650 09/14/15 2344 09/15/15 0243  NA 132* 133* 133*  K 5.9* 6.6* 5.6*  CL 101 100* 100*  CO2 16* 13* 19*  BUN 52* 54* 50*  CREATININE 1.17 1.35* 1.33*  GLUCOSE 126* 82 110*    Electrolytes  Recent Labs Lab 09/14/15 1650 09/14/15 2344 09/15/15 0243  CALCIUM 8.1* 8.7* 8.5*  MG  --  2.1 1.8  PHOS  --  5.3* 4.4    CBC  Recent Labs Lab 09/14/15 1610 09/14/15 2344 09/15/15 0243  WBC 20.0* 18.0* 17.8*  HGB 13.9 12.9* 12.5*  HCT 43.0 40.8 39.1  PLT PLATELET CLUMPS NOTED ON SMEAR, UNABLE TO ESTIMATE 162 141*    Coag's  Recent Labs Lab 09/14/15 1650 09/15/15 0243  APTT 32 31  INR 1.96* 2.09*    Sepsis  Markers  Recent Labs Lab 09/14/15 1618 09/14/15 1650 09/14/15 2344 09/15/15 0243  LATICACIDVEN 8.62*  --  6.4* 3.7*  PROCALCITON  --  2.32  --  3.56    ABG No results for input(s): PHART, PCO2ART, PO2ART in the last 168 hours.  Liver Enzymes  Recent Labs Lab 09/14/15 1650 09/14/15 2344 09/15/15 0243  AST 77* 78* 66*  ALT 66* 66* 60  ALKPHOS 82 79 71  BILITOT 3.3* 3.1* 3.3*  ALBUMIN 2.7* 2.6* 2.3*    Cardiac Enzymes  Recent Labs Lab 09/14/15 2344  TROPONINI 0.05*    Glucose  Recent Labs Lab 09/14/15 1547 09/14/15 2205  GLUCAP 139* 84    Imaging Ct Chest Wo  Contrast  09/15/2015  CLINICAL DATA:  Subacute onset of cough. Sepsis, and body rash. Initial encounter. EXAM: CT CHEST WITHOUT CONTRAST TECHNIQUE: Multidetector CT imaging of the chest was performed following the standard protocol without IV contrast. COMPARISON:  Chest radiograph performed 09/14/2015 FINDINGS: Fluffy bilateral airspace opacification is noted, most prominent at the lower lung lobes. Trace bilateral pleural effusions are seen. Findings are compatible with bilateral pneumonia. Underlying septic emboli cannot be excluded, given the peripheral distribution. No dominant mass is seen. No pneumothorax is identified. A 1.3 cm precarinal node is noted. No additional mediastinal lymphadenopathy is seen. No pericardial effusion is identified. Residual thymic tissue is grossly unremarkable. The visualized portions of thyroid gland are unremarkable. No axillary lymphadenopathy is seen. Vague soft tissue inflammation is noted tracking along the central chest wall, of uncertain significance. Diffuse soft tissue swelling is also noted tracking along the right axilla and right lateral chest wall. Mild underlying soft tissue edema is noted about the upper abdomen. Mild bilateral gynecomastia is noted. Trace ascites is noted surrounding the liver and spleen. The visualized portions of the liver and spleen are grossly unremarkable. The visualized portions of the pancreas, adrenal glands and kidneys are within normal limits. No acute osseous abnormalities are identified. IMPRESSION: 1. Fluffy bilateral airspace opacification, most prominent at the lower lung lobes. Trace bilateral pleural effusions seen. Findings compatible with bilateral pneumonia. 2. Cannot exclude underlying septic emboli, given the peripheral distribution of airspace opacities. 3. 1.3 cm precarinal node may reflect the underlying infection. Four diffuse soft tissue swelling along the right axilla and right lateral chest wall, with vague soft  tissue inflammation along the central chest wall. Mild soft tissue edema about the upper abdomen. 4. Trace ascites noted about the liver and spleen. 5. Mild bilateral gynecomastia noted. Electronically Signed   By: Garald Balding M.D.   On: 09/15/2015 00:47   US Venous Img Lower Bilateral  09/14/2015  CLINICAL DATA:  bilateral lower extremity swelling x1 0.5 weeks EXAM: BILATERAL LOWER EXTREMITY VENOUS DOPPLER ULTRASOUND TECHNIQUE: Gray-scale sonography with compression, as well as color and duplex ultrasound, were performed to evaluate the deep venous system from the level of the common femoral vein through the popliteal and proximal calf veins. COMPARISON:  None FINDINGS: Normal compressibility of the common femoral, superficial femoral, and popliteal veins, as well as the proximal calf veins. No filling defects to suggest DVT on grayscale or color Doppler imaging. Doppler waveforms show normal direction of venous flow, normal respiratory phasicity and response to augmentation. Marked subcutaneous edema right thigh, left ankle. Survey views of the contralateral common femoral vein are unremarkable. IMPRESSION: 1. No evidence of lower extremity deep vein thrombosis, BILATERALLY. Electronically Signed   By: Lucrezia Europe M.D.   On: 09/14/2015 18:27   Dg  Chest Port 1 View  09/14/2015  CLINICAL DATA:  20 year old with 2 week history of shortness of breath and 1-1/2 week history of bilateral lower extremity edema. Recent diagnosis of bronchitis. EXAM: PORTABLE CHEST 1 VIEW COMPARISON:  None. FINDINGS: Cardiac silhouette upper normal in size to slightly enlarged even allowing for AP portable technique. Airspace consolidation in the right upper lobe, right lower lobe, and lingula. No pleural effusions. Normal pulmonary vascularity. IMPRESSION: 1. Multilobar pneumonia involving the right upper lobe, right lower lobe and lingula. 2. Mild cardiomegaly. Electronically Signed   By: Evangeline Dakin M.D.   On: 09/14/2015  17:17    STUDIES:  2/20 - venous doppler US without signs of DVT 2/20 - CXR with multilobar PNA 2/21 - CT chest with bilateral PNA, ?septic emboli, precarinal node, soft tissue edema  CULTURES: BCx 2/20>> UCx 2/20>> RVP 2/20>> Sputum culture 2/20>>  ANTIBIOTICS: Vanc 2/20 >> Zoysn 2/20 >>  SIGNIFICANT EVENTS: 2/20 - admitted for severe sepsis  LINES/TUBES: PIVs  DISCUSSION: 20 y.o. male with no past medical history. Presented to [facility] with bialteral leg swelling and dyspnea for last 2 months not improved with steroids and antibiotics. On admission met sepsis criteria. Lactic acid was 8.6, WBC 20, afebrile.   ASSESSMENT / PLAN:  PULMONARY A: ?Multilobar community acquired pneumonia vs pneumonitis vs autoimmune process causing ILD Tachypnea - resolved At risk for decompensation  P:   Monitor Respiratory status - stable Maintain O2 sats >92%; supplement as needed - on room air CT chest showing nodular consolidations IS  CARDIOVASCULAR A:  Elevated BNP Tachycardia  P:  Cardiac monitoring Repeat EKG in AM Trop mildly elevated at .05; repeat Echo in AM - looking for endocarditis, HF  RENAL A:   AKI Hyponatremia Hyperkalemia UA concerning due to bilirubin, granular cast, and protein  P:   Trend CMP S/p 2L prior to admission Replace electrolytes as need KVO Monitor UOP Renal consulted for possible glomerular pathology ins setting of possible inflammatory etiology/vasculitis  GASTROINTESTINAL A:   Elevated LFTs  P:   Regular diet PPI  Trend LFTs  HEMATOLOGIC A:   Petechiae  Leukocytosis - improving Elevated INR - unknown source thrombocytopenia   P:  Hold off on pharmacologic VTE prophylaxis Trend CBC and coags Monitor for bleeding  INFECTIOUS A:   Severe sepsis 2/2 CAP - qSOFA neg but meets SIRS criteria with HR, lactic acid, and leukocytosis Infectious etiology vs inflammatory  (could be an infectious endocarditis with  septic embolization) Lactic acidosis  P:   Follow cultures Abx as above (vanc/zoysn) Trend Pct Arterial dopplers  Echo pending Added Hep panel and HIV  ENDOCRINE A:   No acute issues P:   Monitor  AUTOIMMUNE A: H/o autoimmune disease in family Possible ILD from automimmune process on CXR; seems inflammatory Vasculitis vs ischemia of toes  P: Full autoimmune lab work-up collected Follow labs Vascular consulted; appreciate recs  NEUROLOGIC A:   No acute issues At risk for acute metabolic encephalopathy   P:   RASS goal: 0 Monitor   FAMILY  - Updates: Family and patient updated at bedside - Inter-disciplinary family meet or Palliative Care meeting due by: 09/21/15  Luiz Blare, DO 09/15/2015, 11:04 AM PGY-2, [place]

## 2015-09-16 ENCOUNTER — Inpatient Hospital Stay (HOSPITAL_COMMUNITY): Payer: BLUE CROSS/BLUE SHIELD

## 2015-09-16 DIAGNOSIS — I998 Other disorder of circulatory system: Secondary | ICD-10-CM

## 2015-09-16 LAB — COMPREHENSIVE METABOLIC PANEL
ALT: 54 U/L (ref 17–63)
AST: 62 U/L — AB (ref 15–41)
Albumin: 2.1 g/dL — ABNORMAL LOW (ref 3.5–5.0)
Alkaline Phosphatase: 67 U/L (ref 38–126)
Anion gap: 11 (ref 5–15)
BUN: 47 mg/dL — AB (ref 6–20)
CHLORIDE: 98 mmol/L — AB (ref 101–111)
CO2: 25 mmol/L (ref 22–32)
Calcium: 7.5 mg/dL — ABNORMAL LOW (ref 8.9–10.3)
Creatinine, Ser: 1.34 mg/dL — ABNORMAL HIGH (ref 0.61–1.24)
GFR calc Af Amer: >60 mL/min (ref >60)
Glucose, Bld: 165 mg/dL — ABNORMAL HIGH (ref 65–99)
POTASSIUM: 3.3 mmol/L — AB (ref 3.5–5.1)
Sodium: 134 mmol/L — ABNORMAL LOW (ref 135–145)
Total Bilirubin: 3.3 mg/dL — ABNORMAL HIGH (ref 0.3–1.2)
Total Protein: 5.2 g/dL — ABNORMAL LOW (ref 6.5–8.1)

## 2015-09-16 LAB — MAGNESIUM: Magnesium: 1.6 mg/dL — ABNORMAL LOW (ref 1.7–2.4)

## 2015-09-16 LAB — PROTIME-INR
INR: 2.19 — ABNORMAL HIGH (ref 0.00–1.49)
Prothrombin Time: 24.2 seconds — ABNORMAL HIGH (ref 11.6–15.2)

## 2015-09-16 LAB — ANTINUCLEAR ANTIBODIES, IFA: ANTINUCLEAR ANTIBODIES, IFA: NEGATIVE

## 2015-09-16 LAB — HEPATITIS PANEL, ACUTE
HCV Ab: <0.1 s/co ratio (ref 0.0–0.9)
HEP A IGM: NEGATIVE
HEP B C IGM: NEGATIVE
HEP B S AG: NEGATIVE

## 2015-09-16 LAB — POCT I-STAT 3, ART BLOOD GAS (G3+)
Acid-base deficit: 3 mmol/L — ABNORMAL HIGH (ref 0.0–2.0)
Bicarbonate: 18.4 mEq/L — ABNORMAL LOW (ref 20.0–24.0)
O2 Saturation: 99 %
PCO2 ART: 26.7 mmHg — AB (ref 35.0–45.0)
PH ART: 7.454 — AB (ref 7.350–7.450)
TCO2: 19 mmol/L (ref 0–100)
pO2, Arterial: 126 mmHg — ABNORMAL HIGH (ref 80.0–100.0)

## 2015-09-16 LAB — URINE CULTURE: CULTURE: NO GROWTH

## 2015-09-16 LAB — ANTI-JO 1 ANTIBODY, IGG

## 2015-09-16 LAB — C3 COMPLEMENT: C3 Complement: 95 mg/dL (ref 82–167)

## 2015-09-16 LAB — RHEUMATOID FACTOR

## 2015-09-16 LAB — CYCLIC CITRUL PEPTIDE ANTIBODY, IGG/IGA: CCP ANTIBODIES IGG/IGA: 9 U (ref 0–19)

## 2015-09-16 LAB — PROCALCITONIN: PROCALCITONIN: 8.36 ng/mL

## 2015-09-16 LAB — CBC
HEMATOCRIT: 35.8 % — AB (ref 39.0–52.0)
HEMOGLOBIN: 11.8 g/dL — AB (ref 13.0–17.0)
MCH: 26.9 pg (ref 26.0–34.0)
MCHC: 33 g/dL (ref 30.0–36.0)
MCV: 81.7 fL (ref 78.0–100.0)
Platelets: 120 10*3/uL — ABNORMAL LOW (ref 150–400)
RBC: 4.38 MIL/uL (ref 4.22–5.81)
RDW: 17.8 % — AB (ref 11.5–15.5)
WBC: 12.2 10*3/uL — AB (ref 4.0–10.5)

## 2015-09-16 LAB — CARBOXYHEMOGLOBIN
CARBOXYHEMOGLOBIN: 1.7 % — AB (ref 0.5–1.5)
METHEMOGLOBIN: 0.8 % (ref 0.0–1.5)
O2 Saturation: 72.3 %
Total hemoglobin: 11.7 g/dL — ABNORMAL LOW (ref 13.5–18.0)

## 2015-09-16 LAB — ANTI-DNA ANTIBODY, DOUBLE-STRANDED

## 2015-09-16 LAB — MPO/PR-3 (ANCA) ANTIBODIES: ANCA Proteinase 3: <3.5 U/mL (ref 0.0–3.5)

## 2015-09-16 LAB — C4 COMPLEMENT: COMPLEMENT C4, BODY FLUID: 19 mg/dL (ref 14–44)

## 2015-09-16 LAB — PHOSPHORUS: PHOSPHORUS: 4 mg/dL (ref 2.5–4.6)

## 2015-09-16 LAB — ANTI-SMITH ANTIBODY

## 2015-09-16 MED ORDER — SODIUM CHLORIDE 0.9 % IV SOLN: 250.0000 mL | INTRAVENOUS | Status: DC | PRN

## 2015-09-16 MED ORDER — DIGOXIN 125 MCG PO TABS
0.1250 mg | ORAL_TABLET | Freq: Every day | ORAL | Status: DC
  Administered 2015-09-16: 0.125 mg via ORAL
  Filled 2015-09-16: qty 1

## 2015-09-16 MED ORDER — FUROSEMIDE 10 MG/ML IJ SOLN
80.0000 mg | Freq: Two times a day (BID) | INTRAMUSCULAR | Status: DC
  Administered 2015-09-17 – 2015-09-19 (×5): 80 mg via INTRAVENOUS
  Filled 2015-09-16 (×6): qty 8

## 2015-09-16 MED ORDER — METHYLPREDNISOLONE SODIUM SUCC 125 MG IJ SOLR
80.0000 mg | Freq: Three times a day (TID) | INTRAMUSCULAR | Status: DC
  Administered 2015-09-16 – 2015-09-18 (×5): 80 mg via INTRAVENOUS
  Filled 2015-09-16: qty 1.28
  Filled 2015-09-16 (×3): qty 2
  Filled 2015-09-16: qty 1.28

## 2015-09-16 MED ORDER — METOPROLOL TARTRATE 1 MG/ML IV SOLN
5.0000 mg | Freq: Once | INTRAVENOUS | Status: AC
  Administered 2015-09-16: 5 mg via INTRAVENOUS

## 2015-09-16 MED ORDER — SODIUM CHLORIDE 0.9% FLUSH: 3.0000 mL | Freq: Two times a day (BID) | INTRAVENOUS | Status: DC

## 2015-09-16 MED ORDER — SPIRONOLACTONE 25 MG PO TABS
12.5000 mg | ORAL_TABLET | Freq: Every day | ORAL | Status: DC
  Administered 2015-09-16 – 2015-09-17 (×2): 12.5 mg via ORAL
  Filled 2015-09-16 (×2): qty 1

## 2015-09-16 MED ORDER — SODIUM CHLORIDE 0.9 % IV SOLN
INTRAVENOUS | Status: DC
Start: 2015-09-17 — End: 2015-09-16

## 2015-09-16 MED ORDER — SODIUM CHLORIDE 0.9 % IV SOLN
INTRAVENOUS | Status: DC
  Administered 2015-09-16: 10:00:00 via INTRAVENOUS

## 2015-09-16 MED ORDER — FUROSEMIDE 10 MG/ML IJ SOLN
80.0000 mg | Freq: Once | INTRAMUSCULAR | Status: AC
  Administered 2015-09-16: 80 mg via INTRAVENOUS
  Filled 2015-09-16: qty 8

## 2015-09-16 MED ORDER — ASPIRIN 81 MG PO CHEW
81.0000 mg | CHEWABLE_TABLET | ORAL | Status: AC
  Administered 2015-09-17: 81 mg via ORAL
  Filled 2015-09-16: qty 1

## 2015-09-16 MED ORDER — METOPROLOL TARTRATE 1 MG/ML IV SOLN
INTRAVENOUS | Status: AC
  Filled 2015-09-16: qty 5

## 2015-09-16 MED ORDER — SODIUM CHLORIDE 0.9% FLUSH: 3.0000 mL | INTRAVENOUS | Status: DC | PRN

## 2015-09-16 MED ORDER — MAGNESIUM SULFATE 4 GM/100ML IV SOLN
4.0000 g | Freq: Once | INTRAVENOUS | Status: AC
Start: 2015-09-16 — End: 2015-09-16
  Administered 2015-09-16: 4 g via INTRAVENOUS
  Filled 2015-09-16: qty 100

## 2015-09-16 MED ORDER — POTASSIUM CHLORIDE CRYS ER 20 MEQ PO TBCR
40.0000 meq | EXTENDED_RELEASE_TABLET | Freq: Once | ORAL | Status: AC
  Administered 2015-09-16: 40 meq via ORAL
  Filled 2015-09-16: qty 2

## 2015-09-16 MED ORDER — ASPIRIN 81 MG PO CHEW
81.0000 mg | CHEWABLE_TABLET | ORAL | Status: DC
Start: 2015-09-17 — End: 2015-09-16

## 2015-09-16 MED ORDER — SODIUM CHLORIDE 0.9 % IV SOLN
INTRAVENOUS | Status: DC
  Administered 2015-09-17: via INTRAVENOUS

## 2015-09-16 MED ORDER — SODIUM CHLORIDE 0.9% FLUSH
3.0000 mL | INTRAVENOUS | Status: DC | PRN
Start: 2015-09-16 — End: 2015-09-17

## 2015-09-16 MED ORDER — SODIUM CHLORIDE 0.9% FLUSH
3.0000 mL | Freq: Two times a day (BID) | INTRAVENOUS | Status: DC
  Administered 2015-09-16: 3 mL via INTRAVENOUS

## 2015-09-16 MED ORDER — ASPIRIN 81 MG PO CHEW
81.0000 mg | CHEWABLE_TABLET | ORAL | Status: AC
  Administered 2015-09-16: 81 mg via ORAL
  Filled 2015-09-16: qty 1

## 2015-09-16 MED FILL — Perflutren Lipid Microsphere IV Susp 1.1 MG/ML: INTRAVENOUS | Qty: 10 | Status: AC

## 2015-09-16 NOTE — Progress Notes (Signed)
Pt's HR in 170s,9Vtach noted, pt denies pain, discomfort or SOB. MD notified and at the bedside. Lopressor IV given as ordered. HR in 120s.

## 2015-09-16 NOTE — Progress Notes (Signed)
   CVP 15-16. Remains tachycardic. 120s. Denies SOB.    Give 80 mg IV lasix now. Stop milrinone. CO-OX in am.    James Mckone  NP-C  2:48 PM

## 2015-09-16 NOTE — Progress Notes (Addendum)
[facility]  Subjective  - Doing better, not working as hard to breath.     Objective 108/44 92 98.5 F (36.9 C) (Oral) 20 94%  Intake/Output Summary (Last 24 hours) at 09/16/15 0740 Last data filed at 09/16/15 0700  Gross per 24 hour  Intake 3065.13 ml  Output  25366 ml  Net -7384.87 ml    Feet with no apparent change in toes, right lateral foot blister Plantar surface with dark discoloration changes. Feet warm, active range of motion intact, sensation to fine touch decreased.  Assessment/Planning: Ischemic toes all 10 digits Diuresing with lasix Cr 1.36 INR 2.19 WBC decresed  Pending cardiac cath today  [facility] 09/16/2015 7:40 AM --  Laboratory Lab Results:  Recent Labs  09/15/15 0243 09/16/15 0030  WBC 17.8* 12.2*  HGB 12.5* 11.8*  HCT 39.1 35.8*  PLT 141* 120*   BMET  Recent Labs  09/15/15 0243 09/16/15 0030  NA 133* 134*  K 5.6* 3.3*  CL 100* 98*  CO2 19* 25  GLUCOSE 110* 165*  BUN 50* 47*  CREATININE 1.33* 1.34*  CALCIUM 8.5* 7.5*    COAG Lab Results  Component Value Date   INR 2.19* 09/16/2015   INR 2.22* 09/15/2015   INR 2.09* 09/15/2015   No results found for: PTT    I agree with the above.  I have seen and evaluated the patient.  Breathing appears much improved after significant diuresis yesterday.  All 10 toes appear slightly improved however are still with an ischemic appearance.  He does have sensation in the feet and can move his toes although movement and feeling of the tips of the toes are diminished.  Continue with observation.  Doppler studies yesterday were normal consider anticoagulation for a short period of time if his INR decreases.  Durene Cal

## 2015-09-16 NOTE — Progress Notes (Signed)
Subjective:  Looks sooo much better- looking at phone- made 10 liters of urine ! Objective Vital signs in last 24 hours: Filed Vitals:   09/16/15 0329 09/16/15 0400 09/16/15 0500 09/16/15 0600  BP:  103/38 101/50 108/44  Pulse:  101 99 92  Temp: 98.5 F (36.9 C)     TempSrc: Oral     Resp:  25 25 20   Height:      Weight:   143.79 kg (317 lb)   SpO2:  93% 93% 94%   Weight change: -11.794 kg (-26 lb)  Intake/Output Summary (Last 24 hours) at 09/16/15 0750 Last data filed at 09/16/15 0700  Gross per 24 hour  Intake 3065.13 ml  Output  16109 ml  Net -7384.87 ml    Assessment/Plan: 20 year old white male presenting with cough, shortness of breath and discolored toes. Evaluation has found an ejection fraction of 15%. He also has some renal insufficiency and marginal urine output 1.Renal- elevated creatinine and previous marginal urine output in the setting of a newly diagnosed cardiomyopathy. Urinalysis really only shows granular casts- no hematuria and minimal proteinuria so don't feel like it's in intra-renal process. I suspect it is a cardiorenal type process. Started on milrinone and  Fortunately he is doing so much better in terms of renal function.  Possibly improved cardiac output has led to better renal perfusion and appropriate diuresis.  I want to stop the lasix and see what his UOP does on its own.  I feel much more optimistic regarding kidney function at this point in time 2. Hypertension/volume - is volume overloaded but hypotensive due to his cardiomyopathy. Now on milrinone. It seems improved cardiac function has led to better renal perfusion and appropriate autodiuresis 3. Hyperkalemia- due to acute kidney injury and also high-dose steroids. Much improved- now hypokalemic- will give 40 PO 4. Anemia - not an issue at this time   Giulio Bertino A    Labs: Basic Metabolic Panel:  Recent Labs Lab 09/14/15 2344 09/15/15 0243 09/16/15 0030  NA 133* 133* 134*   K 6.6* 5.6* 3.3*  CL 100* 100* 98*  CO2 13* 19* 25  GLUCOSE 82 110* 165*  BUN 54* 50* 47*  CREATININE 1.35* 1.33* 1.34*  CALCIUM 8.7* 8.5* 7.5*  PHOS 5.3* 4.4 4.0   Liver Function Tests:  Recent Labs Lab 09/14/15 2344 09/15/15 0243 09/15/15 1059 09/16/15 0030  AST 78* 66*  --  62*  ALT 66* 60  --  54  ALKPHOS 79 71  --  67  BILITOT 3.1* 3.3* 4.3* 3.3*  PROT 6.2* 5.7*  --  5.2*  ALBUMIN 2.6* 2.3*  --  2.1*   No results for input(s): LIPASE, AMYLASE in the last 168 hours. No results for input(s): AMMONIA in the last 168 hours. CBC:  Recent Labs Lab 09/14/15 1610 09/14/15 2344 09/15/15 0243 09/16/15 0030  WBC 20.0* 18.0* 17.8* 12.2*  NEUTROABS 17.0*  --   --   --   HGB 13.9 12.9* 12.5* 11.8*  HCT 43.0 40.8 39.1 35.8*  MCV 81.0 82.9 82.3 81.7  PLT PLATELET CLUMPS NOTED ON SMEAR, UNABLE TO ESTIMATE 162 141* 120*   Cardiac Enzymes:  Recent Labs Lab 09/14/15 2344 09/15/15 1059  CKTOTAL  --  1116*  TROPONINI 0.05* 0.08*   CBG:  Recent Labs Lab 09/14/15 1547 09/14/15 2205 09/15/15 1141  GLUCAP 139* 84 91    Iron Studies: No results for input(s): IRON, TIBC, TRANSFERRIN, FERRITIN in the last 72 hours. Studies/Results: Ct Chest  Wo Contrast  09/15/2015  CLINICAL DATA:  Subacute onset of cough. Sepsis, and body rash. Initial encounter. EXAM: CT CHEST WITHOUT CONTRAST TECHNIQUE: Multidetector CT imaging of the chest was performed following the standard protocol without IV contrast. COMPARISON:  Chest radiograph performed 09/14/2015 FINDINGS: Fluffy bilateral airspace opacification is noted, most prominent at the lower lung lobes. Trace bilateral pleural effusions are seen. Findings are compatible with bilateral pneumonia. Underlying septic emboli cannot be excluded, given the peripheral distribution. No dominant mass is seen. No pneumothorax is identified. A 1.3 cm precarinal node is noted. No additional mediastinal lymphadenopathy is seen. No pericardial effusion  is identified. Residual thymic tissue is grossly unremarkable. The visualized portions of thyroid gland are unremarkable. No axillary lymphadenopathy is seen. Vague soft tissue inflammation is noted tracking along the central chest wall, of uncertain significance. Diffuse soft tissue swelling is also noted tracking along the right axilla and right lateral chest wall. Mild underlying soft tissue edema is noted about the upper abdomen. Mild bilateral gynecomastia is noted. Trace ascites is noted surrounding the liver and spleen. The visualized portions of the liver and spleen are grossly unremarkable. The visualized portions of the pancreas, adrenal glands and kidneys are within normal limits. No acute osseous abnormalities are identified. IMPRESSION: 1. Fluffy bilateral airspace opacification, most prominent at the lower lung lobes. Trace bilateral pleural effusions seen. Findings compatible with bilateral pneumonia. 2. Cannot exclude underlying septic emboli, given the peripheral distribution of airspace opacities. 3. 1.3 cm precarinal node may reflect the underlying infection. Four diffuse soft tissue swelling along the right axilla and right lateral chest wall, with vague soft tissue inflammation along the central chest wall. Mild soft tissue edema about the upper abdomen. 4. Trace ascites noted about the liver and spleen. 5. Mild bilateral gynecomastia noted. Electronically Signed   By: Roanna Raider M.D.   On: 09/15/2015 00:47   US Venous Img Lower Bilateral  09/14/2015  CLINICAL DATA:  bilateral lower extremity swelling x1 0.5 weeks EXAM: BILATERAL LOWER EXTREMITY VENOUS DOPPLER ULTRASOUND TECHNIQUE: Gray-scale sonography with compression, as well as color and duplex ultrasound, were performed to evaluate the deep venous system from the level of the common femoral vein through the popliteal and proximal calf veins. COMPARISON:  None FINDINGS: Normal compressibility of the common femoral, superficial  femoral, and popliteal veins, as well as the proximal calf veins. No filling defects to suggest DVT on grayscale or color Doppler imaging. Doppler waveforms show normal direction of venous flow, normal respiratory phasicity and response to augmentation. Marked subcutaneous edema right thigh, left ankle. Survey views of the contralateral common femoral vein are unremarkable. IMPRESSION: 1. No evidence of lower extremity deep vein thrombosis, BILATERALLY. Electronically Signed   By: Corlis Leak M.D.   On: 09/14/2015 18:27   Dg Chest Port 1 View  09/16/2015  CLINICAL DATA:  Acute respiratory failure EXAM: PORTABLE CHEST 1 VIEW COMPARISON:  September 15, 2015 FINDINGS: Central catheter tip is in the superior vena cava near the cavoatrial junction, stable. No pneumothorax. There is mild atelectasis in the left perihilar region. There is persistent patchy consolidation in the left base. Right lung base now clear. Lungs elsewhere clear. Heart is mildly enlarged with pulmonary vascularity within normal limits. No adenopathy evident. IMPRESSION: Interval clearing of patchy opacity right base. Patchy infiltrate left base stable. Atelectasis left perihilar region. No change in cardiac silhouette. No change in central catheter placement. No pneumothorax peer Electronically Signed   By: Bretta Bang III M.D.  On: 09/16/2015 07:14   Dg Chest Port 1 View  09/15/2015  CLINICAL DATA:  20 year old male central line placement. Initial encounter. EXAM: PORTABLE CHEST 1 VIEW COMPARISON:  Chest CT 0011 hours today and earlier. FINDINGS: Portable AP supine view at 1548 hours. Left IJ approach central venous catheter. Tip projects at the cavoatrial junction level. No pneumothorax. Continued confluent lung base airspace opacity. Stable cardiac size and mediastinal contours. Visualized tracheal air column is within normal limits. No pleural effusion identified. IMPRESSION: 1. Left IJ approach central line placed, with tip at the  cavoatrial junction level and no adverse features. 2. Bilateral pneumonia re- demonstrated. Electronically Signed   By: Odessa Fleming M.D.   On: 09/15/2015 16:14   Dg Chest Port 1 View  09/14/2015  CLINICAL DATA:  20 year old with 2 week history of shortness of breath and 1-1/2 week history of bilateral lower extremity edema. Recent diagnosis of bronchitis. EXAM: PORTABLE CHEST 1 VIEW COMPARISON:  None. FINDINGS: Cardiac silhouette upper normal in size to slightly enlarged even allowing for AP portable technique. Airspace consolidation in the right upper lobe, right lower lobe, and lingula. No pleural effusions. Normal pulmonary vascularity. IMPRESSION: 1. Multilobar pneumonia involving the right upper lobe, right lower lobe and lingula. 2. Mild cardiomegaly. Electronically Signed   By: Hulan Saas M.D.   On: 09/14/2015 17:17   Medications: Infusions: . sodium chloride 10 mL/hr at 09/15/15 1125  . sodium chloride    . furosemide (LASIX) infusion Stopped (09/16/15 0021)  . milrinone 0.25 mcg/kg/min (09/16/15 0615)    Scheduled Medications: . antiseptic oral rinse  7 mL Mouth Rinse BID  . famotidine  20 mg Oral BID  . methylPREDNISolone (SOLU-MEDROL) injection  125 mg Intravenous Q8H  . piperacillin-tazobactam (ZOSYN)  IV  3.375 g Intravenous Q8H  . sodium chloride flush  3 mL Intravenous Q12H  . vancomycin  1,500 mg Intravenous Q8H  . vancomycin  500 mg Intravenous Once    have reviewed scheduled and prn medications.  Physical Exam: General: much better- no WOB Heart: tachy but better  Lungs: CBS bilat Abdomen: obese, soft Extremities: still with pitting edema     09/16/2015,7:50 AM  LOS: 2 days

## 2015-09-16 NOTE — Progress Notes (Signed)
ANTICOAGULATION CONSULT NOTE - Initial Consult  Pharmacy Consult for heparin Indication: possible emoblic event  No Known Allergies  Patient Measurements: Height: 6\' 2"  (188 cm) Weight: (!) 317 lb (143.79 kg) IBW/kg (Calculated) : 82.2 Heparin Dosing Weight: 117 kg  Vital Signs: Temp: 98.3 F (36.8 C) (02/22 0828) Temp Source: Oral (02/22 0828) BP: 103/47 mmHg (02/22 1000) Pulse Rate: 125 (02/22 1000)  Labs:  Recent Labs  09/14/15 1650 09/14/15 2344 09/15/15 0243 09/15/15 1059 09/15/15 1614 09/16/15 0030 09/16/15 0528  HGB  --  12.9* 12.5*  --   --  11.8*  --   HCT  --  40.8 39.1  --   --  35.8*  --   PLT  --  162 141*  --   --  120*  --   APTT 32  --  31  --   --   --   --   LABPROT 22.2*  --  23.3*  --  24.4*  --  24.2*  INR 1.96*  --  2.09*  --  2.22*  --  2.19*  CREATININE 1.17 1.35* 1.33*  --   --  1.34*  --   CKTOTAL  --   --   --  1116*  --   --   --   TROPONINI  --  0.05*  --  0.08*  --   --   --     Estimated Creatinine Clearance: 132.8 mL/min (by C-G formula based on Cr of 1.34).   Medical History: Past Medical History  Diagnosis Date  . Bronchitis     Assessment: 20 yo m with no PMH admitted on 2/20 with bilateral LE swelling x 1 week and SOB x 2.5 months.  Found with an EF of 15% and newly diagnosed cardiomyopathy.  Pharmacy is consulted to dose heparin for a possible embolic event d/t cyanotic toes and feet.  Negative for DVT.  INR was elevated on admission at 1.96 and has been trending up.  2.19 today.  Will hold heparin until INR <2. CBC ok.   Goal of Therapy:  Heparin level 0.3-0.7 units/ml Monitor platelets by anticoagulation protocol: Yes   Plan:  - Hold heparin until INR <2 - Daily INR/CBC - F/u [facility]  Virga Haltiwanger L. Roseanne Reno, PharmD PGY2 Infectious Diseases Pharmacy Resident Pager: [phone] 09/16/2015 11:00 AM

## 2015-09-16 NOTE — Progress Notes (Signed)
PULMONARY / CRITICAL CARE MEDICINE   Name: James Bennett MRN: [id number] DOB: [birth year]    ADMISSION DATE:  09/14/2015 CONSULTATION DATE:  09/14/15  REFERRING MD:  Eustaquio Maize  CHIEF COMPLAINT:  Sepsis   HISTORY OF PRESENT ILLNESS:   James Bennett is a 20 y.o. male who presented to [facility] with complaints of gradual onset, constant, worsening, bilateral lower leg swelling x 1 week. Pt reports that he was having shortness of breath and a dry cough since New Years (approximately 2.5 months ago). He was seen by his PCP 3 weeks ago and diagnosed with acute bronchitis. Pt was placed on Azithromycin and Prednisone without much relief. He was seen again last week and placed on Levaquin. Pt was seen again a couple of days ago and placed on Amoxicillin and a second round of Prednisone. He reports that his shortness of breath has since improved but he is still having a dry cough. Pt mentions that he has been lying around in his recliner for the past 2 weeks due to his bronchitis. He was unconcerned about his lower leg swelling until he noticed this morning that his toes were slightly blackened/purple in color. States that the color changes have been occuring over the last 3 days. Associated coolness to extremities appreciated. Pt states that he has been urinating normally but mentions an orange color to the urine since being on antibiotics.   Of note, patient has 6 dogs at home and one parrot he is exposed to at his dad's job. No recent travel. Mother has h/o Lupus and RA.    SUBJECTIVE:  Diuresed well yesterday - UOP 10L.  Cardiac cath postponed until tomorrow Pt requesting a better bed Had episodes of desat yesterday and placed on O2  VITAL SIGNS: BP 100/36 mmHg  Pulse 104  Temp(Src) 98.5 F (36.9 C) (Oral)  Resp 29  Ht 6' 2"  (1.88 m)  Wt 317 lb (143.79 kg)  BMI 40.68 kg/m2  SpO2 91%  HEMODYNAMICS: CVP:  [2 mmHg-9 mmHg] 2 mmHg  VENTILATOR SETTINGS:    INTAKE / OUTPUT: I/O last 3  completed shifts: In: 8860.8 [P.O.:1920; I.V.:5790.8; IV Piggyback:1150] Out: 3900 [Urine:3900]  PHYSICAL EXAMINATION:  General:  19yo WM with NAD, resting comfortably in bed, awake Neuro:  A&Ox3, non-focal, follows commands. Sensation and strength intact HEENT: [place]/AT, dry MM, EOMI, no scleral icterus, O/P clear, no cervical adenopathy Cardiovascular:  Tachycardic, regular rhythm, no m/r/g appreciated Lungs:  CTAB, no w/r/r, normal work of breathing on room air. Abdomen:  Soft, non-tender, +BS, protuberant  Musculoskeletal:  3+ pitting edema BLE, R hand swelling. Moves all extremities.  Cyanotic toes bilaterally.  Skin: Petechiae appreciated on legs and abdomen. Warm and dry with coolness to bilateral toes.  LABS:  BMET  Recent Labs Lab 09/14/15 2344 09/15/15 0243 09/16/15 0030  NA 133* 133* 134*  K 6.6* 5.6* 3.3*  CL 100* 100* 98*  CO2 13* 19* 25  BUN 54* 50* 47*  CREATININE 1.35* 1.33* 1.34*  GLUCOSE 82 110* 165*    Electrolytes  Recent Labs Lab 09/14/15 2344 09/15/15 0243 09/16/15 0030  CALCIUM 8.7* 8.5* 7.5*  MG 2.1 1.8 1.6*  PHOS 5.3* 4.4 4.0    CBC  Recent Labs Lab 09/14/15 2344 09/15/15 0243 09/16/15 0030  WBC 18.0* 17.8* 12.2*  HGB 12.9* 12.5* 11.8*  HCT 40.8 39.1 35.8*  PLT 162 141* 120*    Coag's  Recent Labs Lab 09/14/15 1650 09/15/15 0243 09/15/15 1614 09/16/15 0528  APTT 32  31  --   --   INR 1.96* 2.09* 2.22* 2.19*    Sepsis Markers  Recent Labs Lab 09/14/15 1618 09/14/15 1650 09/14/15 2344 09/15/15 0243 09/16/15 0030  LATICACIDVEN 8.62*  --  6.4* 3.7*  --   PROCALCITON  --  2.32  --  3.56 8.36    ABG No results for input(s): PHART, PCO2ART, PO2ART in the last 168 hours.  Liver Enzymes  Recent Labs Lab 09/14/15 2344 09/15/15 0243 09/15/15 1059 09/16/15 0030  AST 78* 66*  --  62*  ALT 66* 60  --  54  ALKPHOS 79 71  --  67  BILITOT 3.1* 3.3* 4.3* 3.3*  ALBUMIN 2.6* 2.3*  --  2.1*    Cardiac  Enzymes  Recent Labs Lab 09/14/15 2344 09/15/15 1059  TROPONINI 0.05* 0.08*    Glucose  Recent Labs Lab 09/14/15 1547 09/14/15 2205 09/15/15 1141  GLUCAP 139* 84 91    Imaging Dg Chest Port 1 View  09/15/2015  CLINICAL DATA:  20 year old male central line placement. Initial encounter. EXAM: PORTABLE CHEST 1 VIEW COMPARISON:  Chest CT 0011 hours today and earlier. FINDINGS: Portable AP supine view at 1548 hours. Left IJ approach central venous catheter. Tip projects at the cavoatrial junction level. No pneumothorax. Continued confluent lung base airspace opacity. Stable cardiac size and mediastinal contours. Visualized tracheal air column is within normal limits. No pleural effusion identified. IMPRESSION: 1. Left IJ approach central line placed, with tip at the cavoatrial junction level and no adverse features. 2. Bilateral pneumonia re- demonstrated. Electronically Signed   By: Genevie Ann M.D.   On: 09/15/2015 16:14    STUDIES:  2/20 - venous doppler US without signs of DVT 2/20 - CXR with multilobar PNA 2/21 - CT chest with bilateral PNA, ?septic emboli, precarinal node, soft tissue edema 2/21 - ECHO 09/15/2015 EF 15% IVC dilated RV mildly decreased  CULTURES: BCx 2/20>> NGTD UCx 2/20>> NGTD RVP 2/20>> Sputum culture 2/20>>  ANTIBIOTICS: Vanc 2/20 >> Zoysn 2/20 >>  SIGNIFICANT EVENTS: 2/20 - admitted for severe sepsis  LINES/TUBES: PIVs  DISCUSSION: 20 y.o. male with no past medical history. Presented to [facility] with bialteral leg swelling and dyspnea for last 2 months not improved with steroids and antibiotics. On admission met sepsis criteria. Lactic acid was 8.6, WBC 20, afebrile.   ASSESSMENT / PLAN:  PULMONARY A: ?Multilobar community acquired pneumonia vs pneumonitis vs autoimmune process causing ILD Leaning more towards viral process Pulmonary edema Tachypnea - resolved At risk for decompensation  P:   Monitor Respiratory status -  stable Maintain O2 sats >92%; supplement as needed - currently on 4L [place] CT chest showing nodular consolidations IS  CARDIOVASCULAR A:  Elevated BNP - due to acute HF Tachycardia Cardiomyopathy, viral? Acute CHF with an EF of 15% of unknown etiology Ischemia of toes likely 2/2 low output HF Hypotension due to his cardiomyopathy P:  Cardiac monitoring Cardiology following Trop mildly elevated at .05 Echo per above On milrinone; starting dig and spironolactone per cards Monitor CVPs RHC planned for tomorrow  RENAL A:   AKI possibly due to cardiorenal syndrome - improving Hyponatremia Hyperkalemia - resolved but now hypokalemia UA concerning due to bilirubin, granular cast, and protein Cardiorenal syndrome  P:   Trend CMP S/p 10L diuresis yesterday Replace electrolytes as need KVO Monitor UOP Hold any further Lasix; allow auto-diuresis. Renal following Improved cardiac output has led to better renal perfusion and appropriate diuresis.   GASTROINTESTINAL A:  Elevated LFTs P:   Regular diet PPI  Trend LFTs  HEMATOLOGIC A:   Petechiae  Leukocytosis - improving Elevated INR - unknown source thrombocytopenia   P:  Hold off on pharmacologic VTE prophylaxis Trend CBC and coags Monitor for bleeding  INFECTIOUS A:   Severe sepsis 2/2 ?CAP - qSOFA neg but meets SIRS criteria with HR, lactic acid, and leukocytosis Infectious etiology vs inflammatory  (r/o infectious endocarditis with septic embolization) Viral pneumonitits Lactic acidosis - Resolved  P:   Follow cultures - NGTD Abx as above (vanc/zoysn) - can d/c after 48/hrs if remains stable Trend Pct - worsened this AM Arterial dopplers w/o stenosis or thrombosis  Echo showed EF 15% HIV/ Hep panel neg  ENDOCRINE A:   No acute issues P:   Monitor  AUTOIMMUNE A: H/o autoimmune disease in family Possible ILD from automimmune process on CXR; seems inflammatory Vasculitis vs ischemia of  toes  P: Full autoimmune lab work-up collected. Elevated CRP, normal ESR, RF neg Follow labs Vascular consulted; appreciate recs. Believe this is an ischemic process from low cardiac output  NEUROLOGIC A:   No acute issues At risk for acute metabolic encephalopathy   P:   RASS goal: 0 Monitor   FAMILY  - Updates: Family and patient updated at bedside - Inter-disciplinary family meet or Palliative Care meeting due by: 09/21/15  Luiz Blare, DO 09/16/2015, 6:09 AM PGY-2, [place]

## 2015-09-16 NOTE — Progress Notes (Signed)
Lasix drip stopped per verbal order from Dr. Jamison Neighbor due to polyuria and CVP 2

## 2015-09-16 NOTE — Progress Notes (Signed)
  Advanced Heart Failure Rounding Note   Subjective:    Yesterday he developed acute respiratory failure and required short term BiPap. Diuresed with lasix drip and started on milrinone 0.25 mcg. Diuresed over 7 liters.  WBC trending down. LFTs trending down. Todays CO-OX is 79%.   Denies SOB. Wants to move out of ICU so he can have visitors.   ECHO 09/15/2015 EF 15% IVC dilated RV mildly decreased.   Objective:   Weight Range:  Vital Signs:   Temp:  [97.7 F (36.5 C)-99.9 F (37.7 C)] 98.5 F (36.9 C) (02/22 0329) Pulse Rate:  [92-141] 96 (02/22 0700) Resp:  [17-32] 24 (02/22 0700) BP: (84-121)/(35-83) 98/40 mmHg (02/22 0700) SpO2:  [91 %-100 %] 91 % (02/22 0700) Weight:  [317 lb (143.79 kg)] 317 lb (143.79 kg) (02/22 0500) Last BM Date: 09/15/15  Weight change: Filed Weights   09/14/15 2000 09/15/15 0500 09/16/15 0500  Weight: 331 lb (150.141 kg) 331 lb (150.141 kg) 317 lb (143.79 kg)    Intake/Output:   Intake/Output Summary (Last 24 hours) at 09/16/15 0808 Last data filed at 09/16/15 0700  Gross per 24 hour  Intake 3121.43 ml  Output  10450 ml  Net -7328.57 ml     Physical Exam: CVP 8 General:  Well appearing. No resp difficulty. In bed  HEENT: normal. LIJ  Neck: supple. JVP 8-9  . Carotids 2+ bilat; no bruits. No lymphadenopathy or thryomegaly appreciated. Cor: PMI nondisplaced. Regular rate & rhythm. No rubs, gallops or murmurs. Lungs: clear Abdomen: obese, soft, nontender, nondistended. No hepatosplenomegaly. No bruits or masses. Good bowel sounds. Extremities: no cyanosis, clubbing, rash, R and LLE 2+ edema R and L toes purple  Neuro: alert & orientedx3, cranial nerves grossly intact. moves all 4 extremities w/o difficulty. Affect pleasant  Telemetry: Sinus Tach 110s  Labs: Basic Metabolic Panel:  Recent Labs Lab 09/14/15 1650 09/14/15 2344 09/15/15 0243 09/16/15 0030  NA 132* 133* 133* 134*  K 5.9* 6.6* 5.6* 3.3*  CL 101 100* 100* 98*    CO2 16* 13* 19* 25  GLUCOSE 126* 82 110* 165*  BUN 52* 54* 50* 47*  CREATININE 1.17 1.35* 1.33* 1.34*  CALCIUM 8.1* 8.7* 8.5* 7.5*  MG  --  2.1 1.8 1.6*  PHOS  --  5.3* 4.4 4.0    Liver Function Tests:  Recent Labs Lab 09/14/15 1650 09/14/15 2344 09/15/15 0243 09/15/15 1059 09/16/15 0030  AST 77* 78* 66*  --  62*  ALT 66* 66* 60  --  54  ALKPHOS 82 79 71  --  67  BILITOT 3.3* 3.1* 3.3* 4.3* 3.3*  PROT 6.2* 6.2* 5.7*  --  5.2*  ALBUMIN 2.7* 2.6* 2.3*  --  2.1*   No results for input(s): LIPASE, AMYLASE in the last 168 hours. No results for input(s): AMMONIA in the last 168 hours.  CBC:  Recent Labs Lab 09/14/15 1610 09/14/15 2344 09/15/15 0243 09/16/15 0030  WBC 20.0* 18.0* 17.8* 12.2*  NEUTROABS 17.0*  --   --   --   HGB 13.9 12.9* 12.5* 11.8*  HCT 43.0 40.8 39.1 35.8*  MCV 81.0 82.9 82.3 81.7  PLT PLATELET CLUMPS NOTED ON SMEAR, UNABLE TO ESTIMATE 162 141* 120*    Cardiac Enzymes:  Recent Labs Lab 09/14/15 2344 09/15/15 1059  CKTOTAL  --  1116*  TROPONINI 0.05* 0.08*    BNP: BNP (last 3 results)  Recent Labs  09/14/15 1610  BNP 1312.6*    ProBNP (last   3 results) No results for input(s): PROBNP in the last 8760 hours.    Other results:  Imaging: Ct Chest Wo Contrast  09/15/2015  CLINICAL DATA:  Subacute onset of cough. Sepsis, and body rash. Initial encounter. EXAM: CT CHEST WITHOUT CONTRAST TECHNIQUE: Multidetector CT imaging of the chest was performed following the standard protocol without IV contrast. COMPARISON:  Chest radiograph performed 09/14/2015 FINDINGS: Fluffy bilateral airspace opacification is noted, most prominent at the lower lung lobes. Trace bilateral pleural effusions are seen. Findings are compatible with bilateral pneumonia. Underlying septic emboli cannot be excluded, given the peripheral distribution. No dominant mass is seen. No pneumothorax is identified. A 1.3 cm precarinal node is noted. No additional mediastinal  lymphadenopathy is seen. No pericardial effusion is identified. Residual thymic tissue is grossly unremarkable. The visualized portions of thyroid gland are unremarkable. No axillary lymphadenopathy is seen. Vague soft tissue inflammation is noted tracking along the central chest wall, of uncertain significance. Diffuse soft tissue swelling is also noted tracking along the right axilla and right lateral chest wall. Mild underlying soft tissue edema is noted about the upper abdomen. Mild bilateral gynecomastia is noted. Trace ascites is noted surrounding the liver and spleen. The visualized portions of the liver and spleen are grossly unremarkable. The visualized portions of the pancreas, adrenal glands and kidneys are within normal limits. No acute osseous abnormalities are identified. IMPRESSION: 1. Fluffy bilateral airspace opacification, most prominent at the lower lung lobes. Trace bilateral pleural effusions seen. Findings compatible with bilateral pneumonia. 2. Cannot exclude underlying septic emboli, given the peripheral distribution of airspace opacities. 3. 1.3 cm precarinal node may reflect the underlying infection. Four diffuse soft tissue swelling along the right axilla and right lateral chest wall, with vague soft tissue inflammation along the central chest wall. Mild soft tissue edema about the upper abdomen. 4. Trace ascites noted about the liver and spleen. 5. Mild bilateral gynecomastia noted. Electronically Signed   By: Jeffery  Chang M.D.   On: 09/15/2015 00:47   Us Venous Img Lower Bilateral  09/14/2015  CLINICAL DATA:  bilateral lower extremity swelling x1 0.5 weeks EXAM: BILATERAL LOWER EXTREMITY VENOUS DOPPLER ULTRASOUND TECHNIQUE: Gray-scale sonography with compression, as well as color and duplex ultrasound, were performed to evaluate the deep venous system from the level of the common femoral vein through the popliteal and proximal calf veins. COMPARISON:  None FINDINGS: Normal  compressibility of the common femoral, superficial femoral, and popliteal veins, as well as the proximal calf veins. No filling defects to suggest DVT on grayscale or color Doppler imaging. Doppler waveforms show normal direction of venous flow, normal respiratory phasicity and response to augmentation. Marked subcutaneous edema right thigh, left ankle. Survey views of the contralateral common femoral vein are unremarkable. IMPRESSION: 1. No evidence of lower extremity deep vein thrombosis, BILATERALLY. Electronically Signed   By: D  Hassell M.D.   On: 09/14/2015 18:27   Dg Chest Port 1 View  09/16/2015  CLINICAL DATA:  Acute respiratory failure EXAM: PORTABLE CHEST 1 VIEW COMPARISON:  September 15, 2015 FINDINGS: Central catheter tip is in the superior vena cava near the cavoatrial junction, stable. No pneumothorax. There is mild atelectasis in the left perihilar region. There is persistent patchy consolidation in the left base. Right lung base now clear. Lungs elsewhere clear. Heart is mildly enlarged with pulmonary vascularity within normal limits. No adenopathy evident. IMPRESSION: Interval clearing of patchy opacity right base. Patchy infiltrate left base stable. Atelectasis left perihilar region. No change in cardiac   silhouette. No change in central catheter placement. No pneumothorax peer Electronically Signed   By: William  Woodruff III M.D.   On: 09/16/2015 07:14   Dg Chest Port 1 View  09/15/2015  CLINICAL DATA:  19-year-old male central line placement. Initial encounter. EXAM: PORTABLE CHEST 1 VIEW COMPARISON:  Chest CT 0011 hours today and earlier. FINDINGS: Portable AP supine view at 1548 hours. Left IJ approach central venous catheter. Tip projects at the cavoatrial junction level. No pneumothorax. Continued confluent lung base airspace opacity. Stable cardiac size and mediastinal contours. Visualized tracheal air column is within normal limits. No pleural effusion identified. IMPRESSION: 1. Left  IJ approach central line placed, with tip at the cavoatrial junction level and no adverse features. 2. Bilateral pneumonia re- demonstrated. Electronically Signed   By: H  Hall M.D.   On: 09/15/2015 16:14   Dg Chest Port 1 View  09/14/2015  CLINICAL DATA:  19-year-old with 2 week history of shortness of breath and 1-1/2 week history of bilateral lower extremity edema. Recent diagnosis of bronchitis. EXAM: PORTABLE CHEST 1 VIEW COMPARISON:  None. FINDINGS: Cardiac silhouette upper normal in size to slightly enlarged even allowing for AP portable technique. Airspace consolidation in the right upper lobe, right lower lobe, and lingula. No pleural effusions. Normal pulmonary vascularity. IMPRESSION: 1. Multilobar pneumonia involving the right upper lobe, right lower lobe and lingula. 2. Mild cardiomegaly. Electronically Signed   By: Thomas  Lawrence M.D.   On: 09/14/2015 17:17      Medications:     Scheduled Medications: . antiseptic oral rinse  7 mL Mouth Rinse BID  . famotidine  20 mg Oral BID  . methylPREDNISolone (SOLU-MEDROL) injection  125 mg Intravenous Q8H  . piperacillin-tazobactam (ZOSYN)  IV  3.375 g Intravenous Q8H  . potassium chloride  40 mEq Oral Once  . sodium chloride flush  3 mL Intravenous Q12H  . vancomycin  1,500 mg Intravenous Q8H  . vancomycin  500 mg Intravenous Once     Infusions: . sodium chloride 10 mL/hr at 09/15/15 1125  . sodium chloride    . milrinone 0.25 mcg/kg/min (09/16/15 0615)     PRN Medications:  sodium chloride, sodium chloride, sodium chloride flush   Assessment/Plan/ Discussion  1. Acute Respiratory Failure- Increased WOB. Started on Bipap. Bipap weaned off. Now on 4 liters [place].  2. Sepsis 2/2 ? CAP On Vanc/Zosyn. WBC today coming down. 12.2  Blood and Urine Cultures pending.  3. Multilobal CAP vs Pneumonitis vs autoimmune process Negative Flu . On Vanc and Zosyn. . WBC coming down. On solumedrol.  Autoimmune labs pending.  4. .  Acute Systolic Heart Failure  New onset. ?Viral versus autoimmune. ECHO today EF 15% RV mildly reduced with dilated IVC. Hepatitis panel negative. TSH normal. HIV -NR. Todays CO-OX is 79%. Cut back milrinone to 0.125 mcg. Add 0.125 mcg dig Volume status improving. CVP 8. Diuresed over 7 liters. Lasix drip stopped per Nephrology. No bb for now. Renal function stable. BP soft hold off on ace.  Hold RHC until tomorrow. 5. Elevated LFTs- ? Shock. LFTs coming down.  6. Ischemic Toes- US negative DVT. Vascular following.   7. Hypoalbuminemia - Albumin 2.3  8. Obesity 9. Hypokalemia- K supplemented.  10. Hypomagnesium- Mag 1.6 Give 4 grams Mag  Length of Stay: 2  Amy Clegg NP-C  09/16/2015, 8:08 AM  Advanced Heart Failure Team Pager [phone] (M-F; 7a - 4p)  Please contact [facility] for night-coverage after hours (4p -7a ) and   weekends on [email or web address]  Patient seen with NP, agree with the above note. He is much improved today.  Very good diuresis with CVP down to 8. Good co-ox.    1. Acute systolic CHF: EF 15% by echo with mildly dilated and mildly dysfunctional RV. Cool extremities with sinus tachycardia and elevated lactate initially. Concern for low output HF. He was also markedly volume overloaded. I am concerned for viral myocarditis, has had viral-type symptoms for several weeks and has had antibiotic courses at home prior to admission. He was started on milrinone 0.25 and is doing much better with great diuresis.  CVP down to 8, co-ox 79% this morning.    - Stop Lasix gtt for now, allow auto-diuresis. - Follow CVP by CVL.  - Decrease milrinone to 0.125 and start digoxin. Check co-ox in pm, may be able to stop milrinone.  - Can add spironolactone 12.5 daily.  Possibly ACEI tomorrow if BP and creatinine stable.  - He will need RHC eventually => will arrange for tomorrow, hopefully off milrinone.   - Would like to eventually get cardiac MRI but may be too large for MRI.  2.  Acute respiratory failure: Suspect pulmonary edema plays a role but cannot rule out autoimmune pneumonitis process. He is on nasal cannula now with diuresis. 3. Rash: Petechial rash on lower legs, platelets not significantly low. ?Vasculitis playing a role here.  4. Ischemic toes: ?Low flow/low cardiac output => think unlikely to be cardioembolic with symmetry. No LV thrombus noted on echo. When INR < 2, plan for heparin gtt. 5. Elevated LFTs: Prominent elevated bilirubin. Suspect this may be related to congestive hepatopathy. LFTs coming down.  6. ID: Covering with vancomycin/Zosyn, ? PNA component.  7. Rheum: ?Autoimmune process like vasculitis. Elevated CRP, normal ESR. Would send autoimmune serologies (RF negative, need ANA). Agree with initiation of Solumedrol.  8. AKI: Evaluated by renal, do not think there is autoimmune process involving kidneys, probably cardiorenal situation.   Majesta Leichter 09/16/2015 8:56 AM   

## 2015-09-16 NOTE — Progress Notes (Signed)
UR Completed. Abree Romick, RN, BSN.  [phone] 

## 2015-09-17 ENCOUNTER — Encounter (HOSPITAL_COMMUNITY)
Admission: EM | Disposition: A | Discharge status: Home Health | Payer: Self-pay | Source: Home / Self Care | Attending: Internal Medicine

## 2015-09-17 ENCOUNTER — Encounter (HOSPITAL_COMMUNITY): Payer: Self-pay | Admitting: Cardiology

## 2015-09-17 DIAGNOSIS — J181 Lobar pneumonia, unspecified organism: Secondary | ICD-10-CM

## 2015-09-17 DIAGNOSIS — R21 Rash and other nonspecific skin eruption: Secondary | ICD-10-CM

## 2015-09-17 DIAGNOSIS — I509 Heart failure, unspecified: Secondary | ICD-10-CM

## 2015-09-17 DIAGNOSIS — I5023 Acute on chronic systolic (congestive) heart failure: Secondary | ICD-10-CM

## 2015-09-17 HISTORY — PX: CARDIAC CATHETERIZATION: SHX172

## 2015-09-17 LAB — RESPIRATORY VIRUS PANEL

## 2015-09-17 LAB — BASIC METABOLIC PANEL
ANION GAP: 14 (ref 5–15)
Anion gap: 10 (ref 5–15)
BUN: 33 mg/dL — AB (ref 6–20)
BUN: 33 mg/dL — ABNORMAL HIGH (ref 6–20)
CALCIUM: 7.9 mg/dL — AB (ref 8.9–10.3)
CHLORIDE: 99 mmol/L — AB (ref 101–111)
CO2: 32 mmol/L (ref 22–32)
CO2: 30 mmol/L (ref 22–32)
CREATININE: 1.02 mg/dL (ref 0.61–1.24)
Calcium: 7.6 mg/dL — ABNORMAL LOW (ref 8.9–10.3)
Chloride: 96 mmol/L — ABNORMAL LOW (ref 101–111)
Creatinine, Ser: 1.22 mg/dL (ref 0.61–1.24)
GFR calc Af Amer: >60 mL/min (ref >60)
GFR calc non Af Amer: >60 mL/min (ref >60)
GLUCOSE: 133 mg/dL — AB (ref 65–99)
Glucose, Bld: 228 mg/dL — ABNORMAL HIGH (ref 65–99)
POTASSIUM: 2.4 mmol/L — AB (ref 3.5–5.1)
Potassium: 2.8 mmol/L — ABNORMAL LOW (ref 3.5–5.1)
SODIUM: 141 mmol/L (ref 135–145)
Sodium: 140 mmol/L (ref 135–145)

## 2015-09-17 LAB — POCT I-STAT 3, VENOUS BLOOD GAS (G3P V)
ACID-BASE EXCESS: 8 mmol/L — AB (ref 0.0–2.0)
Acid-Base Excess: 8 mmol/L — ABNORMAL HIGH (ref 0.0–2.0)
BICARBONATE: 32.1 meq/L — AB (ref 20.0–24.0)
BICARBONATE: 33.1 meq/L — AB (ref 20.0–24.0)
O2 Saturation: 57 %
O2 Saturation: 57 %
PCO2 VEN: 43.3 mmHg — AB (ref 45.0–50.0)
PCO2 VEN: 44.2 mmHg — AB (ref 45.0–50.0)
PH VEN: 7.478 — AB (ref 7.250–7.300)
PH VEN: 7.482 — AB (ref 7.250–7.300)
PO2 VEN: 28 mmHg — AB (ref 30.0–45.0)
PO2 VEN: 28 mmHg — AB (ref 30.0–45.0)
TCO2: 33 mmol/L (ref 0–100)
TCO2: 34 mmol/L (ref 0–100)

## 2015-09-17 LAB — CBC
HCT: 34.9 % — ABNORMAL LOW (ref 39.0–52.0)
HEMOGLOBIN: 11.2 g/dL — AB (ref 13.0–17.0)
MCH: 25.8 pg — AB (ref 26.0–34.0)
MCHC: 32.1 g/dL (ref 30.0–36.0)
MCV: 80.4 fL (ref 78.0–100.0)
Platelets: 133 10*3/uL — ABNORMAL LOW (ref 150–400)
RBC: 4.34 MIL/uL (ref 4.22–5.81)
RDW: 18.4 % — ABNORMAL HIGH (ref 11.5–15.5)
WBC: 14.4 10*3/uL — ABNORMAL HIGH (ref 4.0–10.5)

## 2015-09-17 LAB — CARBOXYHEMOGLOBIN
Carboxyhemoglobin: 1.5 % (ref 0.5–1.5)
Methemoglobin: 0.8 % (ref 0.0–1.5)
O2 Saturation: 65.3 %
TOTAL HEMOGLOBIN: 11.5 g/dL — AB (ref 13.5–18.0)

## 2015-09-17 LAB — ANTIPHOSPHOLIPID SYNDROME EVAL, BLD
Anticardiolipin IgA: <9 APL U/mL (ref 0–11)
Anticardiolipin IgG: <9 GPL U/mL (ref 0–14)
DRVVT: 57.2 s — ABNORMAL HIGH (ref 0.0–44.0)
PHOSPHATYDALSERINE, IGG: 2 {GPS'U} (ref 0–11)
PHOSPHATYDALSERINE, IGM: 3 {MPS'U} (ref 0–25)
PTT Lupus Anticoagulant: 32.3 s (ref 0.0–43.6)

## 2015-09-17 LAB — PROCALCITONIN: PROCALCITONIN: 5.42 ng/mL

## 2015-09-17 LAB — MAGNESIUM
MAGNESIUM: 1.7 mg/dL (ref 1.7–2.4)
MAGNESIUM: 1.7 mg/dL (ref 1.7–2.4)

## 2015-09-17 LAB — PROTIME-INR
INR: 1.61 — ABNORMAL HIGH (ref 0.00–1.49)
PROTHROMBIN TIME: 19.1 s — AB (ref 11.6–15.2)

## 2015-09-17 LAB — HEPARIN LEVEL (UNFRACTIONATED): Heparin Unfractionated: <0.1 IU/mL — ABNORMAL LOW (ref 0.30–0.70)

## 2015-09-17 LAB — DRVVT MIX: dRVVT Mix: 42.4 s (ref 0.0–44.0)

## 2015-09-17 SURGERY — RIGHT HEART CATH
Anesthesia: LOCAL

## 2015-09-17 MED ORDER — SODIUM CHLORIDE 0.9 % IV SOLN: 250.0000 mL | INTRAVENOUS | Status: DC | PRN

## 2015-09-17 MED ORDER — SODIUM CHLORIDE 0.9% FLUSH
3.0000 mL | Freq: Two times a day (BID) | INTRAVENOUS | Status: DC
  Administered 2015-09-17: 3 mL via INTRAVENOUS

## 2015-09-17 MED ORDER — FENTANYL CITRATE (PF) 100 MCG/2ML IJ SOLN
INTRAMUSCULAR | Status: AC
  Filled 2015-09-17: qty 2

## 2015-09-17 MED ORDER — POTASSIUM CHLORIDE CRYS ER 20 MEQ PO TBCR
40.0000 meq | EXTENDED_RELEASE_TABLET | Freq: Two times a day (BID) | ORAL | Status: DC
  Administered 2015-09-17: 40 meq via ORAL
  Filled 2015-09-17: qty 2

## 2015-09-17 MED ORDER — WHITE PETROLATUM GEL
Status: AC
  Administered 2015-09-17: 0.2
  Filled 2015-09-17: qty 1

## 2015-09-17 MED ORDER — HEPARIN (PORCINE) IN NACL 2-0.9 UNIT/ML-% IJ SOLN
INTRAMUSCULAR | Status: AC
  Filled 2015-09-17: qty 500

## 2015-09-17 MED ORDER — POTASSIUM CHLORIDE CRYS ER 10 MEQ PO TBCR
EXTENDED_RELEASE_TABLET | ORAL | Status: AC
  Filled 2015-09-17: qty 6

## 2015-09-17 MED ORDER — ONDANSETRON HCL 4 MG/2ML IJ SOLN
4.0000 mg | Freq: Four times a day (QID) | INTRAMUSCULAR | Status: DC | PRN
Start: 2015-09-17 — End: 2015-10-01
  Administered 2015-09-30: 4 mg via INTRAVENOUS
  Filled 2015-09-17: qty 2

## 2015-09-17 MED ORDER — LIDOCAINE HCL (PF) 1 % IJ SOLN
INTRAMUSCULAR | Status: AC
  Filled 2015-09-17: qty 30

## 2015-09-17 MED ORDER — ZOLPIDEM TARTRATE 5 MG PO TABS
5.0000 mg | ORAL_TABLET | Freq: Once | ORAL | Status: AC
  Administered 2015-09-17: 5 mg via ORAL
  Filled 2015-09-17: qty 1

## 2015-09-17 MED ORDER — POTASSIUM CHLORIDE CRYS ER 20 MEQ PO TBCR
60.0000 meq | EXTENDED_RELEASE_TABLET | Freq: Once | ORAL | Status: AC
  Administered 2015-09-17: 60 meq via ORAL

## 2015-09-17 MED ORDER — LIDOCAINE HCL (PF) 1 % IJ SOLN
INTRAMUSCULAR | Status: DC | PRN
Start: 2015-09-17 — End: 2015-09-17
  Administered 2015-09-17: 20 mL

## 2015-09-17 MED ORDER — SODIUM CHLORIDE 0.9% FLUSH: 3.0000 mL | INTRAVENOUS | Status: DC | PRN

## 2015-09-17 MED ORDER — FUROSEMIDE 10 MG/ML IJ SOLN: 80.0000 mg | Freq: Two times a day (BID) | INTRAMUSCULAR | Status: DC

## 2015-09-17 MED ORDER — HEPARIN (PORCINE) IN NACL 100-0.45 UNIT/ML-% IJ SOLN
2950.0000 [IU]/h | INTRAMUSCULAR | Status: DC
  Administered 2015-09-17: 1600 [IU]/h via INTRAVENOUS
  Administered 2015-09-18: 2300 [IU]/h via INTRAVENOUS
  Administered 2015-09-18: 2100 [IU]/h via INTRAVENOUS
  Administered 2015-09-19 – 2015-09-20 (×5): 2400 [IU]/h via INTRAVENOUS
  Administered 2015-09-21: 2950 [IU]/h via INTRAVENOUS
  Administered 2015-09-21: 2700 [IU]/h via INTRAVENOUS
  Filled 2015-09-17 (×10): qty 250

## 2015-09-17 MED ORDER — MAGNESIUM SULFATE IN D5W 10-5 MG/ML-% IV SOLN
1.0000 g | Freq: Once | INTRAVENOUS | Status: AC
  Administered 2015-09-17: 1 g via INTRAVENOUS
  Filled 2015-09-17: qty 100

## 2015-09-17 MED ORDER — MIDAZOLAM HCL 2 MG/2ML IJ SOLN
INTRAMUSCULAR | Status: AC
  Filled 2015-09-17: qty 2

## 2015-09-17 MED ORDER — POTASSIUM CHLORIDE CRYS ER 20 MEQ PO TBCR
40.0000 meq | EXTENDED_RELEASE_TABLET | Freq: Once | ORAL | Status: AC
  Administered 2015-09-17: 40 meq via ORAL
  Filled 2015-09-17: qty 2

## 2015-09-17 MED ORDER — POTASSIUM CHLORIDE CRYS ER 20 MEQ PO TBCR
60.0000 meq | EXTENDED_RELEASE_TABLET | Freq: Three times a day (TID) | ORAL | Status: DC
  Administered 2015-09-17 (×2): 60 meq via ORAL
  Filled 2015-09-17 (×2): qty 3

## 2015-09-17 MED ORDER — HEPARIN (PORCINE) IN NACL 2-0.9 UNIT/ML-% IJ SOLN
INTRAMUSCULAR | Status: DC | PRN
Start: 2015-09-17 — End: 2015-09-17
  Administered 2015-09-17: 08:00:00

## 2015-09-17 MED ORDER — FENTANYL CITRATE (PF) 100 MCG/2ML IJ SOLN
INTRAMUSCULAR | Status: DC | PRN
  Administered 2015-09-17: 25 ug via INTRAVENOUS

## 2015-09-17 MED ORDER — LISINOPRIL 2.5 MG PO TABS
2.5000 mg | ORAL_TABLET | Freq: Every day | ORAL | Status: DC
  Administered 2015-09-17: 2.5 mg via ORAL
  Filled 2015-09-17: qty 1

## 2015-09-17 MED ORDER — ACETAMINOPHEN 325 MG PO TABS
650.0000 mg | ORAL_TABLET | ORAL | Status: DC | PRN
  Administered 2015-09-18 – 2015-09-28 (×5): 650 mg via ORAL
  Filled 2015-09-17 (×5): qty 2

## 2015-09-17 MED ORDER — MIDAZOLAM HCL 2 MG/2ML IJ SOLN
INTRAMUSCULAR | Status: DC | PRN
Start: 2015-09-17 — End: 2015-09-17
  Administered 2015-09-17: 1 mg via INTRAVENOUS

## 2015-09-17 MED ORDER — DIGOXIN 250 MCG PO TABS
0.2500 mg | ORAL_TABLET | Freq: Every day | ORAL | Status: DC
  Administered 2015-09-17 – 2015-10-01 (×15): 0.25 mg via ORAL
  Filled 2015-09-17 (×7): qty 1
  Filled 2015-09-17: qty 2
  Filled 2015-09-17 (×7): qty 1

## 2015-09-17 SURGICAL SUPPLY — 10 items
CATH SWAN GANZ 7F STRAIGHT (CATHETERS) ×2 IMPLANT
HOVERMATT SINGLE USE (MISCELLANEOUS) ×2 IMPLANT
PACK CARDIAC CATHETERIZATION (CUSTOM PROCEDURE TRAY) ×2 IMPLANT
PROTECTION STATION PRESSURIZED (MISCELLANEOUS) ×2
SHEATH PINNACLE 7F 10CM (SHEATH) ×2 IMPLANT
STATION PROTECTION PRESSURIZED (MISCELLANEOUS) ×1 IMPLANT
TRANSDUCER W/STOPCOCK (MISCELLANEOUS) ×2 IMPLANT
TUBING ART PRESS 72  MALE/FEM (TUBING) ×1
TUBING ART PRESS 72 MALE/FEM (TUBING) ×1 IMPLANT
WIRE EMERALD 3MM-J .025X260CM (WIRE) ×2 IMPLANT

## 2015-09-17 NOTE — Progress Notes (Signed)
Patient ID: James Bennett, male   DOB: Aug 29, 1995, 20 y.o.   MRN: [id number]    Advanced Heart Failure Rounding Note   Subjective:    Patient is doing better.  Co-ox today 65%.  Diuresed well again yesterday.  HR in 100s-110s and SBP in 100s-110s.  Off milrinone, on digoxin.   PCT remains elevated at 5.42  ECHO 09/15/2015 EF 15% IVC dilated RV mildly decreased.   RHC Procedural Findings (2/23): Hemodynamics (mmHg) RA mean 19 RV 54/19 PA 51/32, mean 45 PCWP mean 40 Oxygen saturations: PA 57% AO 96% Cardiac Output (Fick) 6.02  Cardiac Index (Fick) 2.24 Cardiac Output (Thermo) 6.88 Cardiac Index (Thermo) 2.56  Objective:   Weight Range:  Vital Signs:   Temp:  [97.9 F (36.6 C)-98.8 F (37.1 C)] 97.9 F (36.6 C) (02/23 0402) Pulse Rate:  [96-173] 119 (02/23 0854) Resp:  [8-31] 18 (02/23 0854) BP: (96-142)/(23-90) 133/78 mmHg (02/23 0854) SpO2:  [0 %-100 %] 94 % (02/23 0854) Weight:  [330 lb (149.687 kg)] 330 lb (149.687 kg) (02/23 0402) Last BM Date: 09/15/15  Weight change: Filed Weights   09/15/15 0500 09/16/15 0500 09/17/15 0402  Weight: 331 lb (150.141 kg) 317 lb (143.79 kg) 330 lb (149.687 kg)    Intake/Output:   Intake/Output Summary (Last 24 hours) at 09/17/15 0928 Last data filed at 09/17/15 0400  Gross per 24 hour  Intake 1636.34 ml  Output   5175 ml  Net -3538.66 ml     Physical Exam: General:  Well appearing. No resp difficulty. In bed  HEENT: normal. [facility]  Neck: Thick. JVP 10+. Carotids 2+ bilat; no bruits. No lymphadenopathy or thryomegaly appreciated. Cor: PMI nondisplaced. Mildly tachy, regular rate & rhythm. +S3.  No murmur.  Lungs: clear Abdomen: obese, soft, nontender, nondistended. No hepatosplenomegaly. No bruits or masses. Good bowel sounds. Extremities: no cyanosis, clubbing, rash, R and LLE 2+ edema. R and L toes purple with some improvement today.  Neuro: alert & orientedx3, cranial nerves grossly intact. moves all 4 extremities w/o  difficulty. Affect pleasant  Telemetry: Sinus Tach 100s-110s  Labs: Basic Metabolic Panel:  Recent Labs Lab 09/14/15 1650 09/14/15 2344 09/15/15 0243 09/16/15 0030 09/17/15 0410  NA 132* 133* 133* 134* 141  K 5.9* 6.6* 5.6* 3.3* 2.8*  CL 101 100* 100* 98* 99*  CO2 16* 13* 19* 25 32  GLUCOSE 126* 82 110* 165* 133*  BUN 52* 54* 50* 47* 33*  CREATININE 1.17 1.35* 1.33* 1.34* 1.02  CALCIUM 8.1* 8.7* 8.5* 7.5* 7.6*  MG  --  2.1 1.8 1.6* 1.7  PHOS  --  5.3* 4.4 4.0  --     Liver Function Tests:  Recent Labs Lab 09/14/15 1650 09/14/15 2344 09/15/15 0243 09/15/15 1059 09/16/15 0030  AST 77* 78* 66*  --  62*  ALT 66* 66* 60  --  54  ALKPHOS 82 79 71  --  67  BILITOT 3.3* 3.1* 3.3* 4.3* 3.3*  PROT 6.2* 6.2* 5.7*  --  5.2*  ALBUMIN 2.7* 2.6* 2.3*  --  2.1*   No results for input(s): LIPASE, AMYLASE in the last 168 hours. No results for input(s): AMMONIA in the last 168 hours.  CBC:  Recent Labs Lab 09/14/15 1610 09/14/15 2344 09/15/15 0243 09/16/15 0030 09/17/15 0410  WBC 20.0* 18.0* 17.8* 12.2* 14.4*  NEUTROABS 17.0*  --   --   --   --   HGB 13.9 12.9* 12.5* 11.8* 11.2*  HCT 43.0 40.8 39.1 35.8* 34.9*  MCV 81.0 82.9 82.3 81.7 80.4  PLT PLATELET CLUMPS NOTED ON SMEAR, UNABLE TO ESTIMATE 162 141* 120* 133*    Cardiac Enzymes:  Recent Labs Lab 09/14/15 2344 09/15/15 1059  CKTOTAL  --  1116*  TROPONINI 0.05* 0.08*    BNP: BNP (last 3 results)  Recent Labs  09/14/15 1610  BNP 1312.6*    ProBNP (last 3 results) No results for input(s): PROBNP in the last 8760 hours.    Other results:  Imaging: Dg Chest Port 1 View  09/16/2015  CLINICAL DATA:  Acute respiratory failure EXAM: PORTABLE CHEST 1 VIEW COMPARISON:  September 15, 2015 FINDINGS: Central catheter tip is in the superior vena cava near the cavoatrial junction, stable. No pneumothorax. There is mild atelectasis in the left perihilar region. There is persistent patchy consolidation in the  left base. Right lung base now clear. Lungs elsewhere clear. Heart is mildly enlarged with pulmonary vascularity within normal limits. No adenopathy evident. IMPRESSION: Interval clearing of patchy opacity right base. Patchy infiltrate left base stable. Atelectasis left perihilar region. No change in cardiac silhouette. No change in central catheter placement. No pneumothorax peer Electronically Signed   By: Lowella Grip III M.D.   On: 09/16/2015 07:14   Dg Chest Port 1 View  09/15/2015  CLINICAL DATA:  20 year old male central line placement. Initial encounter. EXAM: PORTABLE CHEST 1 VIEW COMPARISON:  Chest CT 0011 hours today and earlier. FINDINGS: Portable AP supine view at 1548 hours. Left IJ approach central venous catheter. Tip projects at the cavoatrial junction level. No pneumothorax. Continued confluent lung base airspace opacity. Stable cardiac size and mediastinal contours. Visualized tracheal air column is within normal limits. No pleural effusion identified. IMPRESSION: 1. Left IJ approach central line placed, with tip at the cavoatrial junction level and no adverse features. 2. Bilateral pneumonia re- demonstrated. Electronically Signed   By: Genevie Ann M.D.   On: 09/15/2015 16:14     Medications:     Scheduled Medications: . antiseptic oral rinse  7 mL Mouth Rinse BID  . digoxin  0.25 mg Oral Daily  . famotidine  20 mg Oral BID  . furosemide  80 mg Intravenous BID  . lisinopril  2.5 mg Oral Daily  . methylPREDNISolone (SOLU-MEDROL) injection  80 mg Intravenous Q8H  . piperacillin-tazobactam (ZOSYN)  IV  3.375 g Intravenous Q8H  . potassium chloride  40 mEq Oral BID  . sodium chloride flush  3 mL Intravenous Q12H  . sodium chloride flush  3 mL Intravenous Q12H  . spironolactone  12.5 mg Oral Daily  . vancomycin  1,500 mg Intravenous Q8H  . vancomycin  500 mg Intravenous Once    Infusions: . sodium chloride Stopped (09/16/15 0942)    PRN Medications: sodium chloride,  sodium chloride, sodium chloride, acetaminophen, ondansetron (ZOFRAN) IV, sodium chloride flush, sodium chloride flush   Assessment/Plan/ Discussion     1. Acute systolic CHF: EF 38% by echo with mildly dilated and mildly dysfunctional RV. Cool extremities with sinus tachycardia and elevated lactate initially. Concern for low output HF. He was also markedly volume overloaded. I am concerned for viral myocarditis, has had viral-type symptoms for several weeks and has had antibiotic courses at home prior to admission. Also considering autoimmune source (so far, rheumatologic serologies are all normal).  He was started on milrinone 0.25 and improved with great diuresis.  He is now off milrinone with adequate cardiac output. RHC today showed markedly elevated filling pressures still but relatively preserved cardiac  output. - Follow CVP by CVL.  - Lasix 80 mg IV bid today.  - Given his size, increase digoxin to 0.25.  - Continue spironolactone, add lisinopril 2.5 mg daily.  Will titrate up if BP/renal function tolerate.  - Would like to eventually get cardiac MRI but may be too large for MRI.  2. Acute respiratory failure: Suspect pulmonary edema plays a role but cannot rule out autoimmune pneumonitis process. He is on nasal cannula now with diuresis. 3. Rash: Petechial rash on lower legs, platelets not significantly low. ?Vasculitis playing a role here.  4. Ischemic toes: ?Low flow/low cardiac output => think unlikely to be cardioembolic with symmetry. No LV thrombus noted on echo. He has dopplerable lower extremity pulses.  Vascular has evaluated, recommend short course of anticoagulation.  Now that INR < 2, will start heparin gtt (8 hrs after sheath pulled from [place]). 5. Elevated LFTs: Prominent elevated bilirubin. Suspect this may be related to congestive hepatopathy. LFTs coming down.  6. ID: Covering with vancomycin/Zosyn, ? PNA component.  PCT was very elevated.   7. Rheum: ?Autoimmune  process like vasculitis. Elevated CRP, normal ESR. Would send autoimmune serologies (all negative so far). He is on Solumedrol, wean per CCM.  8. AKI: Evaluated by renal, do not think there is autoimmune process involving kidneys, probably cardiorenal situation.  Creatinine improving.   Loralie Champagne 09/17/2015 9:28 AM

## 2015-09-17 NOTE — H&P (View-Only) (Signed)
Advanced Heart Failure Rounding Note   Subjective:    Yesterday he developed acute respiratory failure and required short term BiPap. Diuresed with lasix drip and started on milrinone 0.25 mcg. Diuresed over 7 liters.  WBC trending down. LFTs trending down. Todays CO-OX is 79%.   Denies SOB. Wants to move out of ICU so he can have visitors.   ECHO 09/15/2015 EF 15% IVC dilated RV mildly decreased.   Objective:   Weight Range:  Vital Signs:   Temp:  [97.7 F (36.5 C)-99.9 F (37.7 C)] 98.5 F (36.9 C) (02/22 0329) Pulse Rate:  [92-141] 96 (02/22 0700) Resp:  [17-32] 24 (02/22 0700) BP: (84-121)/(35-83) 98/40 mmHg (02/22 0700) SpO2:  [91 %-100 %] 91 % (02/22 0700) Weight:  [317 lb (143.79 kg)] 317 lb (143.79 kg) (02/22 0500) Last BM Date: 09/15/15  Weight change: Filed Weights   09/14/15 2000 09/15/15 0500 09/16/15 0500  Weight: 331 lb (150.141 kg) 331 lb (150.141 kg) 317 lb (143.79 kg)    Intake/Output:   Intake/Output Summary (Last 24 hours) at 09/16/15 0808 Last data filed at 09/16/15 0700  Gross per 24 hour  Intake 3121.43 ml  Output  10450 ml  Net -7328.57 ml     Physical Exam: CVP 8 General:  Well appearing. No resp difficulty. In bed  HEENT: normal. [facility]  Neck: supple. JVP 8-9  . Carotids 2+ bilat; no bruits. No lymphadenopathy or thryomegaly appreciated. Cor: PMI nondisplaced. Regular rate & rhythm. No rubs, gallops or murmurs. Lungs: clear Abdomen: obese, soft, nontender, nondistended. No hepatosplenomegaly. No bruits or masses. Good bowel sounds. Extremities: no cyanosis, clubbing, rash, R and LLE 2+ edema R and L toes purple  Neuro: alert & orientedx3, cranial nerves grossly intact. moves all 4 extremities w/o difficulty. Affect pleasant  Telemetry: Sinus Tach 110s  Labs: Basic Metabolic Panel:  Recent Labs Lab 09/14/15 1650 09/14/15 2344 09/15/15 0243 09/16/15 0030  NA 132* 133* 133* 134*  K 5.9* 6.6* 5.6* 3.3*  CL 101 100* 100* 98*    CO2 16* 13* 19* 25  GLUCOSE 126* 82 110* 165*  BUN 52* 54* 50* 47*  CREATININE 1.17 1.35* 1.33* 1.34*  CALCIUM 8.1* 8.7* 8.5* 7.5*  MG  --  2.1 1.8 1.6*  PHOS  --  5.3* 4.4 4.0    Liver Function Tests:  Recent Labs Lab 09/14/15 1650 09/14/15 2344 09/15/15 0243 09/15/15 1059 09/16/15 0030  AST 77* 78* 66*  --  62*  ALT 66* 66* 60  --  54  ALKPHOS 82 79 71  --  67  BILITOT 3.3* 3.1* 3.3* 4.3* 3.3*  PROT 6.2* 6.2* 5.7*  --  5.2*  ALBUMIN 2.7* 2.6* 2.3*  --  2.1*   No results for input(s): LIPASE, AMYLASE in the last 168 hours. No results for input(s): AMMONIA in the last 168 hours.  CBC:  Recent Labs Lab 09/14/15 1610 09/14/15 2344 09/15/15 0243 09/16/15 0030  WBC 20.0* 18.0* 17.8* 12.2*  NEUTROABS 17.0*  --   --   --   HGB 13.9 12.9* 12.5* 11.8*  HCT 43.0 40.8 39.1 35.8*  MCV 81.0 82.9 82.3 81.7  PLT PLATELET CLUMPS NOTED ON SMEAR, UNABLE TO ESTIMATE 162 141* 120*    Cardiac Enzymes:  Recent Labs Lab 09/14/15 2344 09/15/15 1059  CKTOTAL  --  1116*  TROPONINI 0.05* 0.08*    BNP: BNP (last 3 results)  Recent Labs  09/14/15 1610  BNP 1312.6*    ProBNP (last  3 results) No results for input(s): PROBNP in the last 8760 hours.    Other results:  Imaging: Ct Chest Wo Contrast  09/15/2015  CLINICAL DATA:  Subacute onset of cough. Sepsis, and body rash. Initial encounter. EXAM: CT CHEST WITHOUT CONTRAST TECHNIQUE: Multidetector CT imaging of the chest was performed following the standard protocol without IV contrast. COMPARISON:  Chest radiograph performed 09/14/2015 FINDINGS: Fluffy bilateral airspace opacification is noted, most prominent at the lower lung lobes. Trace bilateral pleural effusions are seen. Findings are compatible with bilateral pneumonia. Underlying septic emboli cannot be excluded, given the peripheral distribution. No dominant mass is seen. No pneumothorax is identified. A 1.3 cm precarinal node is noted. No additional mediastinal  lymphadenopathy is seen. No pericardial effusion is identified. Residual thymic tissue is grossly unremarkable. The visualized portions of thyroid gland are unremarkable. No axillary lymphadenopathy is seen. Vague soft tissue inflammation is noted tracking along the central chest wall, of uncertain significance. Diffuse soft tissue swelling is also noted tracking along the right axilla and right lateral chest wall. Mild underlying soft tissue edema is noted about the upper abdomen. Mild bilateral gynecomastia is noted. Trace ascites is noted surrounding the liver and spleen. The visualized portions of the liver and spleen are grossly unremarkable. The visualized portions of the pancreas, adrenal glands and kidneys are within normal limits. No acute osseous abnormalities are identified. IMPRESSION: 1. Fluffy bilateral airspace opacification, most prominent at the lower lung lobes. Trace bilateral pleural effusions seen. Findings compatible with bilateral pneumonia. 2. Cannot exclude underlying septic emboli, given the peripheral distribution of airspace opacities. 3. 1.3 cm precarinal node may reflect the underlying infection. Four diffuse soft tissue swelling along the right axilla and right lateral chest wall, with vague soft tissue inflammation along the central chest wall. Mild soft tissue edema about the upper abdomen. 4. Trace ascites noted about the liver and spleen. 5. Mild bilateral gynecomastia noted. Electronically Signed   By: Garald Balding M.D.   On: 09/15/2015 00:47   US Venous Img Lower Bilateral  09/14/2015  CLINICAL DATA:  bilateral lower extremity swelling x1 0.5 weeks EXAM: BILATERAL LOWER EXTREMITY VENOUS DOPPLER ULTRASOUND TECHNIQUE: Gray-scale sonography with compression, as well as color and duplex ultrasound, were performed to evaluate the deep venous system from the level of the common femoral vein through the popliteal and proximal calf veins. COMPARISON:  None FINDINGS: Normal  compressibility of the common femoral, superficial femoral, and popliteal veins, as well as the proximal calf veins. No filling defects to suggest DVT on grayscale or color Doppler imaging. Doppler waveforms show normal direction of venous flow, normal respiratory phasicity and response to augmentation. Marked subcutaneous edema right thigh, left ankle. Survey views of the contralateral common femoral vein are unremarkable. IMPRESSION: 1. No evidence of lower extremity deep vein thrombosis, BILATERALLY. Electronically Signed   By: Lucrezia Europe M.D.   On: 09/14/2015 18:27   Dg Chest Port 1 View  09/16/2015  CLINICAL DATA:  Acute respiratory failure EXAM: PORTABLE CHEST 1 VIEW COMPARISON:  September 15, 2015 FINDINGS: Central catheter tip is in the superior vena cava near the cavoatrial junction, stable. No pneumothorax. There is mild atelectasis in the left perihilar region. There is persistent patchy consolidation in the left base. Right lung base now clear. Lungs elsewhere clear. Heart is mildly enlarged with pulmonary vascularity within normal limits. No adenopathy evident. IMPRESSION: Interval clearing of patchy opacity right base. Patchy infiltrate left base stable. Atelectasis left perihilar region. No change in cardiac  silhouette. No change in central catheter placement. No pneumothorax peer Electronically Signed   By: Lowella Grip III M.D.   On: 09/16/2015 07:14   Dg Chest Port 1 View  09/15/2015  CLINICAL DATA:  20 year old male central line placement. Initial encounter. EXAM: PORTABLE CHEST 1 VIEW COMPARISON:  Chest CT 0011 hours today and earlier. FINDINGS: Portable AP supine view at 1548 hours. Left IJ approach central venous catheter. Tip projects at the cavoatrial junction level. No pneumothorax. Continued confluent lung base airspace opacity. Stable cardiac size and mediastinal contours. Visualized tracheal air column is within normal limits. No pleural effusion identified. IMPRESSION: 1. Left  IJ approach central line placed, with tip at the cavoatrial junction level and no adverse features. 2. Bilateral pneumonia re- demonstrated. Electronically Signed   By: Genevie Ann M.D.   On: 09/15/2015 16:14   Dg Chest Port 1 View  09/14/2015  CLINICAL DATA:  20 year old with 2 week history of shortness of breath and 1-1/2 week history of bilateral lower extremity edema. Recent diagnosis of bronchitis. EXAM: PORTABLE CHEST 1 VIEW COMPARISON:  None. FINDINGS: Cardiac silhouette upper normal in size to slightly enlarged even allowing for AP portable technique. Airspace consolidation in the right upper lobe, right lower lobe, and lingula. No pleural effusions. Normal pulmonary vascularity. IMPRESSION: 1. Multilobar pneumonia involving the right upper lobe, right lower lobe and lingula. 2. Mild cardiomegaly. Electronically Signed   By: Evangeline Dakin M.D.   On: 09/14/2015 17:17      Medications:     Scheduled Medications: . antiseptic oral rinse  7 mL Mouth Rinse BID  . famotidine  20 mg Oral BID  . methylPREDNISolone (SOLU-MEDROL) injection  125 mg Intravenous Q8H  . piperacillin-tazobactam (ZOSYN)  IV  3.375 g Intravenous Q8H  . potassium chloride  40 mEq Oral Once  . sodium chloride flush  3 mL Intravenous Q12H  . vancomycin  1,500 mg Intravenous Q8H  . vancomycin  500 mg Intravenous Once     Infusions: . sodium chloride 10 mL/hr at 09/15/15 1125  . sodium chloride    . milrinone 0.25 mcg/kg/min (09/16/15 0615)     PRN Medications:  sodium chloride, sodium chloride, sodium chloride flush   Assessment/Plan/ Discussion  1. Acute Respiratory Failure- Increased WOB. Started on Bipap. Bipap weaned off. Now on 4 liters [place].  2. Sepsis 2/2 ? CAP On Vanc/Zosyn. WBC today coming down. 12.2  Blood and Urine Cultures pending.  3. Multilobal CAP vs Pneumonitis vs autoimmune process Negative Flu . On Vanc and Zosyn. . WBC coming down. On solumedrol.  Autoimmune labs pending.  4. .  Acute Systolic Heart Failure  New onset. ?Viral versus autoimmune. ECHO today EF 15% RV mildly reduced with dilated IVC. Hepatitis panel negative. TSH normal. HIV -NR. Todays CO-OX is 79%. Cut back milrinone to 0.125 mcg. Add 0.125 mcg dig Volume status improving. CVP 8. Diuresed over 7 liters. Lasix drip stopped per Nephrology. No bb for now. Renal function stable. BP soft hold off on ace.  Hold RHC until tomorrow. 5. Elevated LFTs- ? Shock. LFTs coming down.  6. Ischemic Toes- [place] negative DVT. Vascular following.   7. Hypoalbuminemia - Albumin 2.3  8. Obesity 9. Hypokalemia- K supplemented.  10. Hypomagnesium- Mag 1.6 Give 4 grams Mag  Length of Stay: 2  Amy Clegg NP-C  09/16/2015, 8:08 AM  Advanced Heart Failure Team Pager [phone] (M-F; 7a - 4p)  Please contact [facility] for night-coverage after hours (4p -7a ) and  weekends on [email or web address]  Patient seen with NP, agree with the above note. He is much improved today.  Very good diuresis with CVP down to 8. Good co-ox.    1. Acute systolic CHF: EF 15% by echo with mildly dilated and mildly dysfunctional RV. Cool extremities with sinus tachycardia and elevated lactate initially. Concern for low output HF. He was also markedly volume overloaded. I am concerned for viral myocarditis, has had viral-type symptoms for several weeks and has had antibiotic courses at home prior to admission. He was started on milrinone 0.25 and is doing much better with great diuresis.  CVP down to 8, co-ox 79% this morning.    - Stop Lasix gtt for now, allow auto-diuresis. - Follow CVP by CVL.  - Decrease milrinone to 0.125 and start digoxin. Check co-ox in pm, may be able to stop milrinone.  - Can add spironolactone 12.5 daily.  Possibly ACEI tomorrow if BP and creatinine stable.  - He will need RHC eventually => will arrange for tomorrow, hopefully off milrinone.   - Would like to eventually get cardiac MRI but may be too large for MRI.  2.  Acute respiratory failure: Suspect pulmonary edema plays a role but cannot rule out autoimmune pneumonitis process. He is on nasal cannula now with diuresis. 3. Rash: Petechial rash on lower legs, platelets not significantly low. ?Vasculitis playing a role here.  4. Ischemic toes: ?Low flow/low cardiac output => think unlikely to be cardioembolic with symmetry. No LV thrombus noted on echo. When INR < 2, plan for heparin gtt. 5. Elevated LFTs: Prominent elevated bilirubin. Suspect this may be related to congestive hepatopathy. LFTs coming down.  6. ID: Covering with vancomycin/Zosyn, ? PNA component.  7. Rheum: ?Autoimmune process like vasculitis. Elevated CRP, normal ESR. Would send autoimmune serologies (RF negative, need ANA). Agree with initiation of Solumedrol.  8. AKI: Evaluated by renal, do not think there is autoimmune process involving kidneys, probably cardiorenal situation.   Dalton McLean 09/16/2015 8:56 AM   

## 2015-09-17 NOTE — Progress Notes (Signed)
Subjective:  Diuresed another 3 liters without lasix- s/p cardiac cath per pt looked OK- so seeming like a viral cardiomyopathy  Objective Vital signs in last 24 hours: Filed Vitals:   09/17/15 0840 09/17/15 0844 09/17/15 0849 09/17/15 0854  BP: 135/74 127/85 127/71 133/78  Pulse: 119 122 123 119  Temp:      TempSrc:      Resp: Height:      Weight:      SpO2: 97% 96% 95% 94%   Weight change: 5.897 kg (13 lb)  Intake/Output Summary (Last 24 hours) at 09/17/15 0949 Last data filed at 09/17/15 0400  Gross per 24 hour  Intake 1636.34 ml  Output   5175 ml  Net -3538.66 ml    Assessment/Plan: 20 year old white male presenting with cough, shortness of breath and discolored toes. Evaluation has found an ejection fraction of 15%. He also had some renal insufficiency and marginal urine output 1.Renal- elevated creatinine and previous marginal urine output in the setting of a newly diagnosed cardiomyopathy. Urinalysis really only shows granular casts- no hematuria and minimal proteinuria so didn't feel like it was an intra-renal process. Kidney function now returned to normal with better cardiac function and kidney perfusion- is diuresing on own 2. Hypertension/volume - is volume overloaded but hypotensive due to his cardiomyopathy. Now BP is better led to better renal perfusion and appropriate autodiuresis 3. Hypokalemia- being repleted 4. Anemia - not an issue at this time  Renal will sign off- I will follow at a distance but call us back if you feel you need   Cyntha Brickman A    Labs: Basic Metabolic Panel:  Recent Labs Lab 09/14/15 2344 09/15/15 0243 09/16/15 0030 09/17/15 0410  NA 133* 133* 134* 141  K 6.6* 5.6* 3.3* 2.8*  CL 100* 100* 98* 99*  CO2 13* 19* 25 32  GLUCOSE 82 110* 165* 133*  BUN 54* 50* 47* 33*  CREATININE 1.35* 1.33* 1.34* 1.02  CALCIUM 8.7* 8.5* 7.5* 7.6*  PHOS 5.3* 4.4 4.0  --    Liver Function Tests:  Recent Labs Lab  09/14/15 2344 09/15/15 0243 09/15/15 1059 09/16/15 0030  AST 78* 66*  --  62*  ALT 66* 60  --  54  ALKPHOS 79 71  --  67  BILITOT 3.1* 3.3* 4.3* 3.3*  PROT 6.2* 5.7*  --  5.2*  ALBUMIN 2.6* 2.3*  --  2.1*   No results for input(s): LIPASE, AMYLASE in the last 168 hours. No results for input(s): AMMONIA in the last 168 hours. CBC:  Recent Labs Lab 09/14/15 1610 09/14/15 2344 09/15/15 0243 09/16/15 0030 09/17/15 0410  WBC 20.0* 18.0* 17.8* 12.2* 14.4*  NEUTROABS 17.0*  --   --   --   --   HGB 13.9 12.9* 12.5* 11.8* 11.2*  HCT 43.0 40.8 39.1 35.8* 34.9*  MCV 81.0 82.9 82.3 81.7 80.4  PLT PLATELET CLUMPS NOTED ON SMEAR, UNABLE TO ESTIMATE 162 141* 120* 133*   Cardiac Enzymes:  Recent Labs Lab 09/14/15 2344 09/15/15 1059  CKTOTAL  --  1116*  TROPONINI 0.05* 0.08*   CBG:  Recent Labs Lab 09/14/15 1547 09/14/15 2205 09/15/15 1141  GLUCAP 139* 84 91    Iron Studies: No results for input(s): IRON, TIBC, TRANSFERRIN, FERRITIN in the last 72 hours. Studies/Results: Dg Chest Port 1 View  09/16/2015  CLINICAL DATA:  Acute respiratory failure EXAM: PORTABLE CHEST 1 VIEW COMPARISON:  September 15, 2015 FINDINGS: Central catheter tip  is in the superior vena cava near the cavoatrial junction, stable. No pneumothorax. There is mild atelectasis in the left perihilar region. There is persistent patchy consolidation in the left base. Right lung base now clear. Lungs elsewhere clear. Heart is mildly enlarged with pulmonary vascularity within normal limits. No adenopathy evident. IMPRESSION: Interval clearing of patchy opacity right base. Patchy infiltrate left base stable. Atelectasis left perihilar region. No change in cardiac silhouette. No change in central catheter placement. No pneumothorax peer Electronically Signed   By: Bretta Bang III M.D.   On: 09/16/2015 07:14   Dg Chest Port 1 View  09/15/2015  CLINICAL DATA:  20 year old male central line placement. Initial  encounter. EXAM: PORTABLE CHEST 1 VIEW COMPARISON:  Chest CT 0011 hours today and earlier. FINDINGS: Portable AP supine view at 1548 hours. Left IJ approach central venous catheter. Tip projects at the cavoatrial junction level. No pneumothorax. Continued confluent lung base airspace opacity. Stable cardiac size and mediastinal contours. Visualized tracheal air column is within normal limits. No pleural effusion identified. IMPRESSION: 1. Left IJ approach central line placed, with tip at the cavoatrial junction level and no adverse features. 2. Bilateral pneumonia re- demonstrated. Electronically Signed   By: Odessa Fleming M.D.   On: 09/15/2015 16:14   Medications: Infusions: . sodium chloride Stopped (09/16/15 0942)    Scheduled Medications: . antiseptic oral rinse  7 mL Mouth Rinse BID  . digoxin  0.25 mg Oral Daily  . famotidine  20 mg Oral BID  . furosemide  80 mg Intravenous BID  . lisinopril  2.5 mg Oral Daily  . methylPREDNISolone (SOLU-MEDROL) injection  80 mg Intravenous Q8H  . piperacillin-tazobactam (ZOSYN)  IV  3.375 g Intravenous Q8H  . potassium chloride  40 mEq Oral BID  . sodium chloride flush  3 mL Intravenous Q12H  . sodium chloride flush  3 mL Intravenous Q12H  . spironolactone  12.5 mg Oral Daily  . vancomycin  1,500 mg Intravenous Q8H  . vancomycin  500 mg Intravenous Once    have reviewed scheduled and prn medications.  Physical Exam: General: much better- no WOB Heart: tachy but better  Lungs: CBS bilat Abdomen: obese, soft Extremities: still with pitting edema     09/17/2015,9:49 AM  LOS: 3 days

## 2015-09-17 NOTE — Progress Notes (Signed)
ANTICOAGULATION CONSULT NOTE  Pharmacy Consult for heparin Indication: possible emoblic event  No Known Allergies  Patient Measurements: Height: 6\' 2"  (188 cm) Weight: (!) 330 lb (149.687 kg) IBW/kg (Calculated) : 82.2 Heparin Dosing Weight: 117 kg  Vital Signs: Temp: 97.9 F (36.6 C) (02/23 0402) Temp Source: Oral (02/23 0402) BP: 133/78 mmHg (02/23 0854) Pulse Rate: 119 (02/23 0854)  Labs:  Recent Labs  09/14/15 1650 09/14/15 2344 09/15/15 0243 09/15/15 1059 09/15/15 1614 09/16/15 0030 09/16/15 0528 09/17/15 0410  HGB  --  12.9* 12.5*  --   --  11.8*  --  11.2*  HCT  --  40.8 39.1  --   --  35.8*  --  34.9*  PLT  --  162 141*  --   --  120*  --  133*  APTT 32  --  31  --   --   --   --   --   LABPROT 22.2*  --  23.3*  --  24.4*  --  24.2* 19.1*  INR 1.96*  --  2.09*  --  2.22*  --  2.19* 1.61*  CREATININE 1.17 1.35* 1.33*  --   --  1.34*  --  1.02  CKTOTAL  --   --   --  1116*  --   --   --   --   TROPONINI  --  0.05*  --  0.08*  --   --   --   --     Estimated Creatinine Clearance: 178.4 mL/min (by C-G formula based on Cr of 1.02).   Medical History: Past Medical History  Diagnosis Date  . Bronchitis     Assessment: 20 yo m with no PMH admitted on 2/20 with bilateral LE swelling x 1 week and SOB x 2.5 months.  Found with an EF of 15% and newly diagnosed cardiomyopathy.  Pharmacy is consulted to dose heparin for a possible embolic event d/t cyanotic toes and feet.  Negative for DVT.  INR was elevated on admission at 1.96 and has been trending up.  Down to 1.61 today.  Pharmacy asked to start IV heparin 8 hrs after sheath out (s/p RHC-removed at 0849).  Goal of Therapy:  Heparin level 0.3-0.7 units/ml Monitor platelets by anticoagulation protocol: Yes   Plan:  - Start IV heparin at rate of 1600 units/hr at 1700 PM. - Check heparin level 6 hrs after heparin started. - Daily heparin level, INR, and CBC. - F/u plans for long-term  anticoagulation.  Tad Moore, BCPS  Clinical Pharmacist Pager [phone]  09/17/2015 10:12 AM

## 2015-09-17 NOTE — Progress Notes (Addendum)
[facility] PROGRESS NOTE  James Bennett QGB:[id number] DOB: 10/26/1995 DOA: 09/14/2015 PCP: No primary care provider on file.  Length of stay: 3   Assessment/Plan: Active Problems:   Lobar pneumonia ([place])   Sepsis ([place])   Encounter for central line placement   Lower extremity edema   Acute respiratory failure ([place])   Cardiomyopathy ([place])   Peripheral ischemia   Brief summary 20 y.o. male with really no past medical history who presented to an urgent care with a 3 week history of shortness of breath and coughing. He had undergone rounds of antibiotics and prednisone with not much relief. He also was complaining of bilateral lower leg swelling and eventually what prompted him to come to attention were toes being dark in color. Upon initial evaluation he was found to have a lowish blood pressure of 110- which did drop into the 90s- was found to have a creatinine initially of 1.17 which worsened to 1.3 and also an elevated white blood count at 20,000, elevated lactate at over 8 which has decreased. Urinalysis shows granular casts but really minimal protein and no hematuria. Patient has been hydrated aggressively. His urine output is marginal- then today, echo shows ejection fraction of 15% raising question of possible infectious cardiomyopathy? Has been started on milrinone. CK is 1100.   Assessment and plan Acute systolic heart failure, EF of 15% Concern for viral myocarditis vs autoimmune source , so far rheumatologic workup has been normal. Heart failure team consulted, patient was started on milrinone to facilitate diuresis. Patient is now off milrinone. Status post right heart cath on 2/23 showed markedly elevated filling pressure. Continue Lasix 80 mg IV twice a day, digoxin 0.25, Aldactone, start patient on lisinopril, cardiology considering performing a cardiac MRI  Acute hypoxemic respiratory failure secondary to pulmonary edema vs autoimmune pneumonitis, CT chest showing  nodular consolidations  Severe sepsis 2/2 ?CAP - qSOFA neg but meets SIRS criteria with HR, lactic acid, and leukocytosis Abx as above (vanc/zoysn) - can d/c after 24/hrs if remains stable. Covering with vancomycin/Zosyn,   Rash: Petechial rash on lower legs, platelets not significantly low.Doubt vasculitis, ANCA negative. Acute hepatitis panel negative, HIV antibody nonreactive     Ischemic toes: ?Low flow/low cardiac output => think unlikely to be cardioembolic with symmetry. No LV thrombus noted on echo. He has dopplerable lower extremity pulses. Vascular has evaluated, recommend short course of anticoagulation. Arterial Doppler within normal limits. Patient currently on a heparin drip  Elevated LFTs:improving ,  Prominent elevated bilirubin. Suspect this may be related to congestive hepatopathy. LFTs coming down.    AKI: Evaluated by renal, do not think there is autoimmune process involving kidneys, probably cardiorenal situation. Creatinine improving. ANCA neg.Elevated CRP, normal ESR.  autoimmune serologies (all negative so far). He is on Solumedrol, wean per CCM.      DVT prophylaxsis  heparin drip  Code Status:      Code Status Orders        Start     Ordered   09/14/15 2222  Full code   Continuous     09/14/15 2228    Code Status History    Date Active Date Inactive Code Status Order ID Comments User Context   This patient has a current code status but no historical code status.      Family Communication: Discussed in detail with the patient and his parents, all imaging results, lab results explained to the patient   Disposition Plan:  Continue patient stepdown, mobilize    Consultants:  Cardiology  Nephrology    Procedures:  None  Antibiotics: Anti-infectives    Start     Dose/Rate Route Frequency Ordered Stop   09/15/15 0800  vancomycin (VANCOCIN) 1,500 mg in sodium chloride 0.9 % 500 mL IVPB     1,500 mg 250 mL/hr over 120 Minutes  Intravenous Every 8 hours 09/14/15 1816     09/15/15 0100  piperacillin-tazobactam (ZOSYN) IVPB 3.375 g     3.375 g 12.5 mL/hr over 240 Minutes Intravenous Every 8 hours 09/14/15 1650     09/14/15 1907  vancomycin (VANCOCIN) 500 MG powder    Comments:  Eulogio Ditch   : cabinet override      09/14/15 1907 09/14/15 1910   09/14/15 1900  vancomycin (VANCOCIN) 500 mg in sodium chloride 0.9 % 100 mL IVPB     500 mg 100 mL/hr over 60 Minutes Intravenous  Once 09/14/15 1714     09/14/15 1800  vancomycin (VANCOCIN) 1,500 mg in sodium chloride 0.9 % 500 mL IVPB  Status:  Discontinued     1,500 mg 250 mL/hr over 120 Minutes Intravenous  Once 09/14/15 1647 09/14/15 1713   09/14/15 1800  vancomycin (VANCOCIN) IVPB 1000 mg/200 mL premix     1,000 mg 200 mL/hr over 60 Minutes Intravenous  Once 09/14/15 1714 09/14/15 1909   09/14/15 1700  vancomycin (VANCOCIN) IVPB 1000 mg/200 mL premix     1,000 mg 200 mL/hr over 60 Minutes Intravenous  Once 09/14/15 1647 09/14/15 1812   09/14/15 1630  piperacillin-tazobactam (ZOSYN) IVPB 3.375 g     3.375 g 100 mL/hr over 30 Minutes Intravenous  Once 09/14/15 1620 09/14/15 1707   09/14/15 1630  vancomycin (VANCOCIN) IVPB 1000 mg/200 mL premix  Status:  Discontinued     1,000 mg 200 mL/hr over 60 Minutes Intravenous  Once 09/14/15 1620 09/14/15 1646         HPI/Subjective: Minimal shortness of breath and no chest pain today. Off milrinone, on digoxin. 3680 cc urine output   Objective: Filed Vitals:   09/17/15 0840 09/17/15 0844 09/17/15 0849 09/17/15 0854  BP: 135/74 127/85 127/71 133/78  Pulse: 119 122 123 119  Temp:      TempSrc:      Resp: _0 Height:      Weight:      SpO2: 97% 96% 95% 94%    Intake/Output Summary (Last 24 hours) at 09/17/15 1103 Last data filed at 09/17/15 1000  Gross per 24 hour  Intake 1692.14 ml  Output   5325 ml  Net -3632.86 ml    Exam: General: Well appearing. No resp difficulty. In bed  HEENT:  normal. [facility]  Neck: Thick. JVP 10+. Carotids 2+ bilat; no bruits. No lymphadenopathy or thryomegaly appreciated. Cor: PMI nondisplaced. Mildly tachy, regular rate & rhythm. +S3. No murmur.  Lungs: clear Abdomen: obese, soft, nontender, nondistended. No hepatosplenomegaly. No bruits or masses. Good bowel sounds. Extremities: no cyanosis, clubbing, rash, R and LLE 2+ edema. R and L toes purple with some improvement today.  Neuro: alert & orientedx3, cranial nerves grossly intact. moves all 4 extremities w/o difficulty. Affect pleasant  Data Review   Micro Results Recent Results (from the past 240 hour(s))  Culture, blood (routine x 2)     Status: None (Preliminary result)   Collection Time: 09/14/15  3:48 PM  Result Value Ref Range Status   Specimen Description BLOOD LEFT FOREARM  Final  Special Requests BOTTLES DRAWN AEROBIC AND ANAEROBIC 5CC EACH  Final   Culture   Final    NO GROWTH 2 DAYS Performed at [facility]    Report Status PENDING  Incomplete  Culture, blood (routine x 2)     Status: None (Preliminary result)   Collection Time: 09/14/15  4:30 PM  Result Value Ref Range Status   Specimen Description BLOOD LEFT ANTECUBITAL  Final   Special Requests BOTTLES DRAWN AEROBIC AND ANAEROBIC 6 CC EACH  Final   Culture   Final    NO GROWTH 2 DAYS Performed at [facility]    Report Status PENDING  Incomplete  MRSA PCR Screening     Status: None   Collection Time: 09/14/15  9:45 PM  Result Value Ref Range Status   MRSA by PCR NEGATIVE NEGATIVE Final    Comment:        The GeneXpert MRSA Assay (FDA approved for NASAL specimens only), is one component of a comprehensive MRSA colonization surveillance program. It is not intended to diagnose MRSA infection nor to guide or monitor treatment for MRSA infections.   Urine culture     Status: None   Collection Time: 09/14/15 11:32 PM  Result Value Ref Range Status   Specimen Description URINE, CLEAN CATCH   Final   Special Requests NONE  Final   Culture NO GROWTH 1 DAY  Final   Report Status 09/16/2015 FINAL  Final    Radiology Reports Ct Chest Wo Contrast  09/15/2015  CLINICAL DATA:  Subacute onset of cough. Sepsis, and body rash. Initial encounter. EXAM: CT CHEST WITHOUT CONTRAST TECHNIQUE: Multidetector CT imaging of the chest was performed following the standard protocol without IV contrast. COMPARISON:  Chest radiograph performed 09/14/2015 FINDINGS: Fluffy bilateral airspace opacification is noted, most prominent at the lower lung lobes. Trace bilateral pleural effusions are seen. Findings are compatible with bilateral pneumonia. Underlying septic emboli cannot be excluded, given the peripheral distribution. No dominant mass is seen. No pneumothorax is identified. A 1.3 cm precarinal node is noted. No additional mediastinal lymphadenopathy is seen. No pericardial effusion is identified. Residual thymic tissue is grossly unremarkable. The visualized portions of thyroid gland are unremarkable. No axillary lymphadenopathy is seen. Vague soft tissue inflammation is noted tracking along the central chest wall, of uncertain significance. Diffuse soft tissue swelling is also noted tracking along the right axilla and right lateral chest wall. Mild underlying soft tissue edema is noted about the upper abdomen. Mild bilateral gynecomastia is noted. Trace ascites is noted surrounding the liver and spleen. The visualized portions of the liver and spleen are grossly unremarkable. The visualized portions of the pancreas, adrenal glands and kidneys are within normal limits. No acute osseous abnormalities are identified. IMPRESSION: 1. Fluffy bilateral airspace opacification, most prominent at the lower lung lobes. Trace bilateral pleural effusions seen. Findings compatible with bilateral pneumonia. 2. Cannot exclude underlying septic emboli, given the peripheral distribution of airspace opacities. 3. 1.3 cm precarinal  node may reflect the underlying infection. Four diffuse soft tissue swelling along the right axilla and right lateral chest wall, with vague soft tissue inflammation along the central chest wall. Mild soft tissue edema about the upper abdomen. 4. Trace ascites noted about the liver and spleen. 5. Mild bilateral gynecomastia noted. Electronically Signed   By: Garald Balding M.D.   On: 09/15/2015 00:47   US Venous Img Lower Bilateral  09/14/2015  CLINICAL DATA:  bilateral lower extremity swelling x1 0.5 weeks  EXAM: BILATERAL LOWER EXTREMITY VENOUS DOPPLER ULTRASOUND TECHNIQUE: Gray-scale sonography with compression, as well as color and duplex ultrasound, were performed to evaluate the deep venous system from the level of the common femoral vein through the popliteal and proximal calf veins. COMPARISON:  None FINDINGS: Normal compressibility of the common femoral, superficial femoral, and popliteal veins, as well as the proximal calf veins. No filling defects to suggest DVT on grayscale or color Doppler imaging. Doppler waveforms show normal direction of venous flow, normal respiratory phasicity and response to augmentation. Marked subcutaneous edema right thigh, left ankle. Survey views of the contralateral common femoral vein are unremarkable. IMPRESSION: 1. No evidence of lower extremity deep vein thrombosis, BILATERALLY. Electronically Signed   By: Lucrezia Europe M.D.   On: 09/14/2015 18:27   Dg Chest Port 1 View  09/16/2015  CLINICAL DATA:  Acute respiratory failure EXAM: PORTABLE CHEST 1 VIEW COMPARISON:  September 15, 2015 FINDINGS: Central catheter tip is in the superior vena cava near the cavoatrial junction, stable. No pneumothorax. There is mild atelectasis in the left perihilar region. There is persistent patchy consolidation in the left base. Right lung base now clear. Lungs elsewhere clear. Heart is mildly enlarged with pulmonary vascularity within normal limits. No adenopathy evident. IMPRESSION:  Interval clearing of patchy opacity right base. Patchy infiltrate left base stable. Atelectasis left perihilar region. No change in cardiac silhouette. No change in central catheter placement. No pneumothorax peer Electronically Signed   By: Lowella Grip III M.D.   On: 09/16/2015 07:14   Dg Chest Port 1 View  09/15/2015  CLINICAL DATA:  20 year old male central line placement. Initial encounter. EXAM: PORTABLE CHEST 1 VIEW COMPARISON:  Chest CT 0011 hours today and earlier. FINDINGS: Portable AP supine view at 1548 hours. Left IJ approach central venous catheter. Tip projects at the cavoatrial junction level. No pneumothorax. Continued confluent lung base airspace opacity. Stable cardiac size and mediastinal contours. Visualized tracheal air column is within normal limits. No pleural effusion identified. IMPRESSION: 1. Left IJ approach central line placed, with tip at the cavoatrial junction level and no adverse features. 2. Bilateral pneumonia re- demonstrated. Electronically Signed   By: Genevie Ann M.D.   On: 09/15/2015 16:14   Dg Chest Port 1 View  09/14/2015  CLINICAL DATA:  20 year old with 2 week history of shortness of breath and 1-1/2 week history of bilateral lower extremity edema. Recent diagnosis of bronchitis. EXAM: PORTABLE CHEST 1 VIEW COMPARISON:  None. FINDINGS: Cardiac silhouette upper normal in size to slightly enlarged even allowing for AP portable technique. Airspace consolidation in the right upper lobe, right lower lobe, and lingula. No pleural effusions. Normal pulmonary vascularity. IMPRESSION: 1. Multilobar pneumonia involving the right upper lobe, right lower lobe and lingula. 2. Mild cardiomegaly. Electronically Signed   By: Evangeline Dakin M.D.   On: 09/14/2015 17:17     CBC  Recent Labs Lab 09/14/15 1610 09/14/15 2344 09/15/15 0243 09/16/15 0030 09/17/15 0410  WBC 20.0* 18.0* 17.8* 12.2* 14.4*  HGB 13.9 12.9* 12.5* 11.8* 11.2*  HCT 43.0 40.8 39.1 35.8* 34.9*  PLT  PLATELET CLUMPS NOTED ON SMEAR, UNABLE TO ESTIMATE 162 141* 120* 133*  MCV 81.0 82.9 82.3 81.7 80.4  MCH 26.2 26.2 26.3 26.9 25.8*  MCHC 32.3 31.6 32.0 33.0 32.1  RDW 19.5* 17.6* 17.4* 17.8* 18.4*  LYMPHSABS 1.2  --   --   --   --   MONOABS 1.8*  --   --   --   --  EOSABS 0.0  --   --   --   --   BASOSABS 0.0  --   --   --   --     Chemistries   Recent Labs Lab 09/14/15 1650 09/14/15 2344 09/15/15 0243 09/15/15 1059 09/16/15 0030 09/17/15 0410  NA 132* 133* 133*  --  134* 141  K 5.9* 6.6* 5.6*  --  3.3* 2.8*  CL 101 100* 100*  --  98* 99*  CO2 16* 13* 19*  --  25 32  GLUCOSE 126* 82 110*  --  165* 133*  BUN 52* 54* 50*  --  47* 33*  CREATININE 1.17 1.35* 1.33*  --  1.34* 1.02  CALCIUM 8.1* 8.7* 8.5*  --  7.5* 7.6*  MG  --  2.1 1.8  --  1.6* 1.7  AST 77* 78* 66*  --  62*  --   ALT 66* 66* 60  --  54  --   ALKPHOS 82 79 71  --  67  --   BILITOT 3.3* 3.1* 3.3* 4.3* 3.3*  --    ------------------------------------------------------------------------------------------------------------------ estimated creatinine clearance is 178.4 mL/min (by C-G formula based on Cr of 1.02). ------------------------------------------------------------------------------------------------------------------ No results for input(s): HGBA1C in the last 72 hours. ------------------------------------------------------------------------------------------------------------------ No results for input(s): CHOL, HDL, LDLCALC, TRIG, CHOLHDL, LDLDIRECT in the last 72 hours. ------------------------------------------------------------------------------------------------------------------  Recent Labs  09/14/15 2344  TSH 3.110   ------------------------------------------------------------------------------------------------------------------ No results for input(s): VITAMINB12, FOLATE, FERRITIN, TIBC, IRON, RETICCTPCT in the last 72 hours.  Coagulation profile  Recent Labs Lab 09/14/15 1650  09/15/15 0243 09/15/15 1614 09/16/15 0528 09/17/15 0410  INR 1.96* 2.09* 2.22* 2.19* 1.61*    No results for input(s): DDIMER in the last 72 hours.  Cardiac Enzymes  Recent Labs Lab 09/14/15 2344 09/15/15 1059  TROPONINI 0.05* 0.08*   ------------------------------------------------------------------------------------------------------------------ Invalid input(s): POCBNP   CBG:  Recent Labs Lab 09/14/15 1547 09/14/15 2205 09/15/15 1141  GLUCAP 139* 84 91       Studies: Dg Chest Port 1 View  09/16/2015  CLINICAL DATA:  Acute respiratory failure EXAM: PORTABLE CHEST 1 VIEW COMPARISON:  September 15, 2015 FINDINGS: Central catheter tip is in the superior vena cava near the cavoatrial junction, stable. No pneumothorax. There is mild atelectasis in the left perihilar region. There is persistent patchy consolidation in the left base. Right lung base now clear. Lungs elsewhere clear. Heart is mildly enlarged with pulmonary vascularity within normal limits. No adenopathy evident. IMPRESSION: Interval clearing of patchy opacity right base. Patchy infiltrate left base stable. Atelectasis left perihilar region. No change in cardiac silhouette. No change in central catheter placement. No pneumothorax peer Electronically Signed   By: Lowella Grip III M.D.   On: 09/16/2015 07:14   Dg Chest Port 1 View  09/15/2015  CLINICAL DATA:  20 year old male central line placement. Initial encounter. EXAM: PORTABLE CHEST 1 VIEW COMPARISON:  Chest CT 0011 hours today and earlier. FINDINGS: Portable AP supine view at 1548 hours. Left IJ approach central venous catheter. Tip projects at the cavoatrial junction level. No pneumothorax. Continued confluent lung base airspace opacity. Stable cardiac size and mediastinal contours. Visualized tracheal air column is within normal limits. No pleural effusion identified. IMPRESSION: 1. Left IJ approach central line placed, with tip at the cavoatrial junction  level and no adverse features. 2. Bilateral pneumonia re- demonstrated. Electronically Signed   By: Genevie Ann M.D.   On: 09/15/2015 16:14      No results found for: HGBA1C  Lab Results  Component Value Date   CREATININE 1.02 09/17/2015       Scheduled Meds: . antiseptic oral rinse  7 mL Mouth Rinse BID  . digoxin  0.25 mg Oral Daily  . famotidine  20 mg Oral BID  . furosemide  80 mg Intravenous BID  . lisinopril  2.5 mg Oral Daily  . methylPREDNISolone (SOLU-MEDROL) injection  80 mg Intravenous Q8H  . piperacillin-tazobactam (ZOSYN)  IV  3.375 g Intravenous Q8H  . potassium chloride  40 mEq Oral BID  . sodium chloride flush  3 mL Intravenous Q12H  . sodium chloride flush  3 mL Intravenous Q12H  . spironolactone  12.5 mg Oral Daily  . vancomycin  1,500 mg Intravenous Q8H  . vancomycin  500 mg Intravenous Once   Continuous Infusions: . sodium chloride Stopped (09/16/15 0942)  . heparin      Active Problems:   Lobar pneumonia ([place])   Sepsis ([place])   Encounter for central line placement   Lower extremity edema   Acute respiratory failure ([place])   Cardiomyopathy ([place])   Peripheral ischemia    Time spent: 45 minutes   [facility] Pager [phone]. If 7PM-7AM, please contact night-coverage at [email or web address], password [facility] 09/17/2015, 11:03 AM  LOS: 3 days

## 2015-09-17 NOTE — Progress Notes (Signed)
D/w Dr Susie Cassette of [facility] - p[facility]m can sign off.   Dr. Kalman Shan, M.D., [facility].C.P Pulmonary and Critical Care Medicine Staff Physician [facility] [place] [facility] Pager: [phone], If no answer or between  15:00h - 7:00h: call [phone]  09/17/2015 1:13 PM

## 2015-09-17 NOTE — Progress Notes (Signed)
Pharmacy Antibiotic Note  James Bennett is a 20 y.o. male who presented to [facility] admitted on 09/14/2015 with bilateral LE swelling (with noted discoloration of toes) for 1 week and SOB/cough for 2.5 months. Pharmacy consulted to dose Vancomycin + Cefepime for r/o sepsis.   The patient has already received a dose of Zosyn 3.375g IV x 1 dose at 1638. No Vancomycin has been given yet. SCr 1.17, CrCl~100 ml/min (normalized)  Plan: 1. Vancomycin 1500 mg IV q 8 hrs - off schedule this AM due to cath lab, will check trough ASAP. 2. Zosyn 3.375g IV q 8 hrs (extended interval infusion) 3. Will continue to follow renal function, culture results, LOT, and antibiotic de-escalation plans   Height: 6\' 2"  (188 cm) Weight: (!) 330 lb (149.687 kg) IBW/kg (Calculated) : 82.2  Temp (24hrs), Avg:98.3 F (36.8 C), Min:97.9 F (36.6 C), Max:98.8 F (37.1 C)   Recent Labs Lab 09/14/15 1610 09/14/15 1618 09/14/15 1650 09/14/15 2344 09/15/15 0243 09/16/15 0030 09/17/15 0410  WBC 20.0*  --   --  18.0* 17.8* 12.2* 14.4*  CREATININE  --   --  1.17 1.35* 1.33* 1.34* 1.02  LATICACIDVEN  --  8.62*  --  6.4* 3.7*  --   --     Estimated Creatinine Clearance: 178.4 mL/min (by C-G formula based on Cr of 1.02).    No Known Allergies  Antimicrobials this admission: Zosyn 2/20 >> Vanc 2/20 >>  Dose adjustments this admission: n/a  Microbiology results: 2/20 UCx: neg 2/20 BCx: ngtd 2/20 sputum: no result  2/20 MRSA PCR neg 2/20 strep neg 2/20 legionella ip 2/20 Hep panel neg 2/20 HIV neg  Thank you for allowing pharmacy to be a part of this patient's care.  Tad Moore, BCPS  Clinical Pharmacist Pager [phone]  09/17/2015 11:30 AM

## 2015-09-17 NOTE — Interval H&P Note (Signed)
History and Physical Interval Note:  09/17/2015 8:04 AM  James Bennett  has presented today for surgery, with the diagnosis of HF  The various methods of treatment have been discussed with the patient and family. After consideration of risks, benefits and other options for treatment, the patient has consented to  Procedure(s): Right Heart Cath (N/A) as a surgical intervention .  The patient's history has been reviewed, patient examined, no change in status, stable for surgery.  I have reviewed the patient's chart and labs.  Questions were answered to the patient's satisfaction.     Joie Hipps [employer]

## 2015-09-18 DIAGNOSIS — I82401 Acute embolism and thrombosis of unspecified deep veins of right lower extremity: Secondary | ICD-10-CM

## 2015-09-18 LAB — CBC
HEMATOCRIT: 37.5 % — AB (ref 39.0–52.0)
HEMOGLOBIN: 12.2 g/dL — AB (ref 13.0–17.0)
MCH: 27.1 pg (ref 26.0–34.0)
MCHC: 32.5 g/dL (ref 30.0–36.0)
MCV: 83.3 fL (ref 78.0–100.0)
Platelets: 145 10*3/uL — ABNORMAL LOW (ref 150–400)
RBC: 4.5 MIL/uL (ref 4.22–5.81)
RDW: 19.3 % — AB (ref 11.5–15.5)
WBC: 15.2 10*3/uL — AB (ref 4.0–10.5)

## 2015-09-18 LAB — COMPREHENSIVE METABOLIC PANEL
ALBUMIN: 2 g/dL — AB (ref 3.5–5.0)
ALK PHOS: 73 U/L (ref 38–126)
ALT: 97 U/L — AB (ref 17–63)
AST: 134 U/L — ABNORMAL HIGH (ref 15–41)
Anion gap: 10 (ref 5–15)
BUN: 25 mg/dL — AB (ref 6–20)
CALCIUM: 7.5 mg/dL — AB (ref 8.9–10.3)
CO2: 34 mmol/L — AB (ref 22–32)
CREATININE: 0.81 mg/dL (ref 0.61–1.24)
Chloride: 96 mmol/L — ABNORMAL LOW (ref 101–111)
GFR calc Af Amer: >60 mL/min (ref >60)
GFR calc non Af Amer: >60 mL/min (ref >60)
GLUCOSE: 130 mg/dL — AB (ref 65–99)
Potassium: 3.2 mmol/L — ABNORMAL LOW (ref 3.5–5.1)
SODIUM: 140 mmol/L (ref 135–145)
Total Bilirubin: 2.6 mg/dL — ABNORMAL HIGH (ref 0.3–1.2)
Total Protein: 5.1 g/dL — ABNORMAL LOW (ref 6.5–8.1)

## 2015-09-18 LAB — PROTIME-INR
INR: 1.44 (ref 0.00–1.49)
Prothrombin Time: 17.6 seconds — ABNORMAL HIGH (ref 11.6–15.2)

## 2015-09-18 LAB — CARBOXYHEMOGLOBIN
CARBOXYHEMOGLOBIN: 1.8 % — AB (ref 0.5–1.5)
METHEMOGLOBIN: 0.7 % (ref 0.0–1.5)
O2 Saturation: 55.7 %
TOTAL HEMOGLOBIN: 12.9 g/dL — AB (ref 13.5–18.0)

## 2015-09-18 LAB — HEPARIN LEVEL (UNFRACTIONATED)
HEPARIN UNFRACTIONATED: 0.33 [IU]/mL (ref 0.30–0.70)
Heparin Unfractionated: 0.28 IU/mL — ABNORMAL LOW (ref 0.30–0.70)
Heparin Unfractionated: 0.38 IU/mL (ref 0.30–0.70)

## 2015-09-18 LAB — VANCOMYCIN, TROUGH: Vancomycin Tr: 23 ug/mL — ABNORMAL HIGH (ref 10.0–20.0)

## 2015-09-18 MED ORDER — POLYETHYLENE GLYCOL 3350 17 G PO PACK
17.0000 g | PACK | Freq: Every day | ORAL | Status: DC
  Filled 2015-09-18 (×2): qty 1

## 2015-09-18 MED ORDER — SODIUM CHLORIDE 0.9% FLUSH: 10.0000 mL | Freq: Two times a day (BID) | INTRAVENOUS | Status: DC

## 2015-09-18 MED ORDER — SODIUM CHLORIDE 0.9% FLUSH: 10.0000 mL | INTRAVENOUS | Status: DC | PRN

## 2015-09-18 MED ORDER — POTASSIUM CHLORIDE CRYS ER 20 MEQ PO TBCR
60.0000 meq | EXTENDED_RELEASE_TABLET | Freq: Four times a day (QID) | ORAL | Status: AC
Start: 2015-09-18 — End: 2015-09-19
  Administered 2015-09-18 – 2015-09-19 (×4): 60 meq via ORAL
  Filled 2015-09-18 (×4): qty 3

## 2015-09-18 MED ORDER — MAGNESIUM OXIDE 400 (241.3 MG) MG PO TABS
400.0000 mg | ORAL_TABLET | Freq: Every day | ORAL | Status: DC
  Administered 2015-09-18 – 2015-09-21 (×4): 400 mg via ORAL
  Filled 2015-09-18 (×4): qty 1

## 2015-09-18 MED ORDER — SODIUM CHLORIDE 0.9% FLUSH
10.0000 mL | Freq: Two times a day (BID) | INTRAVENOUS | Status: DC
  Administered 2015-09-18 – 2015-09-23 (×9): 10 mL
  Administered 2015-09-24 – 2015-09-25 (×2): 20 mL

## 2015-09-18 MED ORDER — IVABRADINE HCL 5 MG PO TABS: 5.0000 mg | ORAL_TABLET | Freq: Two times a day (BID) | ORAL | Status: DC

## 2015-09-18 MED ORDER — ZOLPIDEM TARTRATE 5 MG PO TABS
5.0000 mg | ORAL_TABLET | Freq: Every evening | ORAL | Status: DC | PRN
  Administered 2015-09-18 – 2015-09-30 (×11): 5 mg via ORAL
  Filled 2015-09-18 (×12): qty 1

## 2015-09-18 MED ORDER — SPIRONOLACTONE 25 MG PO TABS
25.0000 mg | ORAL_TABLET | Freq: Every day | ORAL | Status: DC
  Administered 2015-09-18 – 2015-10-01 (×14): 25 mg via ORAL
  Filled 2015-09-18 (×14): qty 1

## 2015-09-18 MED ORDER — IVABRADINE HCL 5 MG PO TABS
5.0000 mg | ORAL_TABLET | Freq: Two times a day (BID) | ORAL | Status: DC
  Administered 2015-09-18 – 2015-09-25 (×15): 5 mg via ORAL
  Filled 2015-09-18 (×17): qty 1

## 2015-09-18 MED ORDER — SODIUM CHLORIDE 0.9% FLUSH
10.0000 mL | INTRAVENOUS | Status: DC | PRN
  Administered 2015-09-19: 30 mL
  Filled 2015-09-18: qty 40

## 2015-09-18 MED ORDER — VANCOMYCIN HCL 10 G IV SOLR
1500.0000 mg | Freq: Two times a day (BID) | INTRAVENOUS | Status: DC
  Administered 2015-09-18 – 2015-09-21 (×6): 1500 mg via INTRAVENOUS
  Filled 2015-09-18 (×7): qty 1500

## 2015-09-18 MED ORDER — POTASSIUM CHLORIDE CRYS ER 20 MEQ PO TBCR
40.0000 meq | EXTENDED_RELEASE_TABLET | Freq: Once | ORAL | Status: AC
  Administered 2015-09-18: 40 meq via ORAL
  Filled 2015-09-18: qty 2

## 2015-09-18 MED ORDER — LISINOPRIL 2.5 MG PO TABS
2.5000 mg | ORAL_TABLET | Freq: Two times a day (BID) | ORAL | Status: DC
  Administered 2015-09-18 – 2015-09-20 (×6): 2.5 mg via ORAL
  Filled 2015-09-18 (×6): qty 1

## 2015-09-18 NOTE — Progress Notes (Signed)
Pharmacy Antibiotic Note  James Bennett is a 20 y.o. male who presented to [facility] admitted on 09/14/2015 with bilateral LE swelling (with noted discoloration of toes) for 1 week and SOB/cough for 2.5 months. Pharmacy consulted to dose Vancomycin + Cefepime for r/o sepsis.   Vancomycin trough drawn a little late, still above goal at 23.  Renal function stable.  Plan: 1. Change vancomycin to 1500 mg IV q 12 hrs. 2. Zosyn 3.375g IV q 8 hrs (extended interval infusion) 3. Will continue to follow renal function, culture results, LOT, and antibiotic de-escalation plans   Height: 6\' 2"  (188 cm) Weight: (!) 327 lb (148.326 kg) IBW/kg (Calculated) : 82.2  Temp (24hrs), Avg:98.2 F (36.8 C), Min:96.7 F (35.9 C), Max:99.8 F (37.7 C)   Recent Labs Lab 09/14/15 1618  09/14/15 2344 09/15/15 0243 09/16/15 0030 09/17/15 0410 09/17/15 1545 09/18/15 0400 09/18/15 1227  WBC  --   --  18.0* 17.8* 12.2* 14.4*  --  15.2*  --   CREATININE  --   < > 1.35* 1.33* 1.34* 1.02 1.22 0.81  --   LATICACIDVEN 8.62*  --  6.4* 3.7*  --   --   --   --   --   VANCOTROUGH  --   --   --   --   --   --   --   --  23*  < > = values in this interval not displayed.  Estimated Creatinine Clearance: 223.5 mL/min (by C-G formula based on Cr of 0.81).    No Known Allergies  Antimicrobials this admission: Zosyn 2/20 >> Vanc 2/20 >>  Dose adjustments this admission: n/a  Microbiology results: 2/20 UCx: neg 2/20 BCx: ngtd 2/20 sputum: no result  2/20 MRSA PCR neg 2/20 strep neg 2/20 legionella ip 2/20 Hep panel neg 2/20 HIV neg  Thank you for allowing pharmacy to be a part of this patient's care.  Tad Moore, BCPS  Clinical Pharmacist Pager [phone]  09/18/2015 1:26 PM

## 2015-09-18 NOTE — Progress Notes (Signed)
ANTICOAGULATION CONSULT NOTE  Pharmacy Consult for heparin Indication: possible emoblic event  No Known Allergies  Patient Measurements: Height: 6\' 2"  (188 cm) Weight: (!) 327 lb (148.326 kg) IBW/kg (Calculated) : 82.2 Heparin Dosing Weight: 117 kg  Vital Signs: Temp: 96.7 F (35.9 C) (02/24 1158) Temp Source: Axillary (02/24 1158) BP: 120/56 mmHg (02/24 1206) Pulse Rate: 142 (02/24 1200)  Labs:  Recent Labs  09/16/15 0030 09/16/15 0528 09/17/15 0410 09/17/15 1545 09/17/15 2218 09/18/15 0400 09/18/15 0541 09/18/15 1417  HGB 11.8*  --  11.2*  --   --  12.2*  --   --   HCT 35.8*  --  34.9*  --   --  37.5*  --   --   PLT 120*  --  133*  --   --  145*  --   --   LABPROT  --  24.2* 19.1*  --   --  17.6*  --   --   INR  --  2.19* 1.61*  --   --  1.44  --   --   HEPARINUNFRC  --   --   --   --  <0.10*  --  0.28* 0.38  CREATININE 1.34*  --  1.02 1.22  --  0.81  --   --     Estimated Creatinine Clearance: 223.5 mL/min (by C-G formula based on Cr of 0.81).   Medical History: Past Medical History  Diagnosis Date  . Bronchitis     Assessment: 21 yo m with no PMH admitted on 2/20 with bilateral LE swelling x 1 week and SOB x 2.5 months.  Found with an EF of 15% and newly diagnosed cardiomyopathy.  Pharmacy is consulted to dose heparin for a possible embolic event d/t cyanotic toes and feet.  Negative for DVT.  INR was elevated on admission at 1.96 and has been trending up.  Down to 1.44 today.  Heparin level now therapeutic on heparin 2300 units/hr.  No bleeding or complications noted.  Goal of Therapy:  Heparin level 0.3-0.7 units/ml Monitor platelets by anticoagulation protocol: Yes   Plan:  - Continue IV heparin at current rate. - Confirm heparin level in 6 hrs. - Daily heparin level, INR, and CBC. - F/u plans for long-term anticoagulation vs. Stopping heparin  Tad Moore, BCPS  Clinical Pharmacist Pager [phone]  09/18/2015 2:52  PM

## 2015-09-18 NOTE — Progress Notes (Signed)
ANTICOAGULATION CONSULT NOTE  Pharmacy Consult for heparin Indication: possible emoblic event  No Known Allergies  Patient Measurements: Height: 6\' 2"  (188 cm) Weight: (!) 328 lb 0.7 oz (148.8 kg) IBW/kg (Calculated) : 82.2 Heparin Dosing Weight: 117 kg  Vital Signs: Temp: 100 F (37.8 C) (02/24 2018) Temp Source: Oral (02/24 2018) BP: 124/63 mmHg (02/24 2021) Pulse Rate: 120 (02/24 1700)  Labs:  Recent Labs  09/16/15 0030 09/16/15 0528 09/17/15 0410 09/17/15 1545  09/18/15 0400 09/18/15 0541 09/18/15 1417 09/18/15 2030  HGB 11.8*  --  11.2*  --   --  12.2*  --   --   --   HCT 35.8*  --  34.9*  --   --  37.5*  --   --   --   PLT 120*  --  133*  --   --  145*  --   --   --   LABPROT  --  24.2* 19.1*  --   --  17.6*  --   --   --   INR  --  2.19* 1.61*  --   --  1.44  --   --   --   HEPARINUNFRC  --   --   --   --   < >  --  0.28* 0.38 0.33  CREATININE 1.34*  --  1.02 1.22  --  0.81  --   --   --   < > = values in this interval not displayed.  Estimated Creatinine Clearance: 223.9 mL/min (by C-G formula based on Cr of 0.81).   Medical History: Past Medical History  Diagnosis Date  . Bronchitis     Assessment: 20 yo m with no PMH admitted on 2/20 with bilateral LE swelling x 1 week and SOB x 2.5 months.  Found with an EF of 15% and newly diagnosed cardiomyopathy.  Pharmacy is consulted to dose heparin for a possible embolic event d/t cyanotic toes and feet.  Negative for DVT.  INR was elevated on admission at 1.96 and has been trending up.  Down to 1.44 today.  Heparin level therapeutic at 0.33 on heparin 2300 units/hr - at low end of goal.  No bleeding or complications noted.  Goal of Therapy:  Heparin level 0.3-0.7 units/ml Monitor platelets by anticoagulation protocol: Yes   Plan:  - increase heparin to 2400 units/hr - Daily heparin level, INR, and CBC. - F/u plans for long-term anticoagulation vs. Stopping heparin  Herby Abraham,  Pharm.D. [phone] 09/18/2015 9:13 PM

## 2015-09-18 NOTE — Progress Notes (Signed)
ANTICOAGULATION CONSULT NOTE - Follow Up Consult  Pharmacy Consult for heparin Indication: possible embolic event  Labs:  Recent Labs  09/15/15 0243 09/15/15 1059 09/15/15 1614 09/16/15 0030 09/16/15 0528 09/17/15 0410 09/17/15 1545 09/17/15 2218  HGB 12.5*  --   --  11.8*  --  11.2*  --   --   HCT 39.1  --   --  35.8*  --  34.9*  --   --   PLT 141*  --   --  120*  --  133*  --   --   APTT 31  --   --   --   --   --   --   --   LABPROT 23.3*  --  24.4*  --  24.2* 19.1*  --   --   INR 2.09*  --  2.22*  --  2.19* 1.61*  --   --   HEPARINUNFRC  --   --   --   --   --   --   --  <0.10*  CREATININE 1.33*  --   --  1.34*  --  1.02 1.22  --   CKTOTAL  --  1116*  --   --   --   --   --   --   TROPONINI  --  0.08*  --   --   --   --   --   --      Assessment: 20yo male undetectable on heparin with initial dosing for possible embolic event.  Goal of Therapy:  Heparin level 0.3-0.7 units/ml   Plan:  Will increase heparin gtt by 4 units/kgABW/hr to 2100 units/hr and check level in 6hr.  Vernard Gambles, PharmD, BCPS  09/18/2015,12:08 AM

## 2015-09-18 NOTE — Evaluation (Addendum)
Physical Therapy Evaluation Patient Details Name: James Bennett MRN: [id number] DOB: Apr 21, 1996 Today's Date: 09/18/2015   History of Present Illness  Patient is a 20 y/o male admitted with SOB and cough x 2.5 months and recent LE swelling and bluish color of toes.  Demonstrated 15% EF on echo with possible viral myocarditis. Did develop respiratory failure requiring bipap temporarily.  Clinical Impression  Patient presents with decreased mobility due to deficits listed in PT problem list.  Will benefit from skilled PT in the acute setting to allow return home with family support and follow up HHPT.  Encouraged daily ambulation in the hospital with nursing assist as well.    Follow Up Recommendations Home health PT;Supervision/Assistance - 24 hour    Equipment Recommendations  Rolling walker with 5" wheels;3in1 (PT) (bariatric equipment)    Recommendations for Other Services       Precautions / Restrictions Precautions Precautions: Fall Precaution Comments: watch HR      Mobility  Bed Mobility Overal bed mobility: Needs Assistance Bed Mobility: Sidelying to Sit;Supine to Sit     Supine to sit: Mod assist;HOB elevated     General bed mobility comments: increased time, assist for legs and scooting out to EOB  Transfers Overall transfer level: Needs assistance   Transfers: Sit to/from Stand Sit to Stand: +2 physical assistance;Mod assist         General transfer comment: up from bed, low 3:1 and w/c; difficulty getting feet back under him so tended to want to pull up on walker/bed rails/etc; cues for pushing up from chair  Ambulation/Gait Ambulation/Gait assistance: Min assist;+2 safety/equipment Ambulation Distance (Feet): 90 Feet Assistive device: Rolling walker (2 wheeled) Gait Pattern/deviations: Step-through pattern;Decreased stride length;Trunk flexed     General Gait Details: flexed due to walker too short initially, then still slightly flexed due to heavy UE  support   Research scientist (physical sciences) Wheelchair mobility: Yes Distance: pushed in w/c around unit to get pt out of room at his request dependent due to multiple lines x 400'  Modified Rankin (Stroke Patients Only)       Balance Overall balance assessment: Needs assistance   Sitting balance-Leahy Scale: Fair     Standing balance support: Bilateral upper extremity supported Standing balance-Leahy Scale: Poor Standing balance comment: UE support needed for balance                             Pertinent Vitals/Pain Pain Assessment: Faces Faces Pain Scale: Hurts little more Pain Location: feet with standing Pain Descriptors / Indicators: Sore Pain Intervention(s): Monitored during session;Limited activity within patient's tolerance    Home Living Family/patient expects to be discharged to:: Private residence Living Arrangements: Parent Available Help at Discharge: Family;Available 24 hours/day Type of Home: House Home Access: Stairs to enter Entrance Stairs-Rails: None Entrance Stairs-Number of Steps: 1 Home Layout: One level Home Equipment: None      Prior Function Level of Independence: Independent               Hand Dominance        Extremity/Trunk Assessment   Upper Extremity Assessment: RUE deficits/detail RUE Deficits / Details: edema in hand and arm noted         Lower Extremity Assessment: RLE deficits/detail;LLE deficits/detail RLE Deficits / Details: AROM grossly WFL except knee flexion due to edema, strength at least 3/5, difficutly functionally  with sit to stand LLE Deficits / Details: AROM grossly WFL except knee flexion due to edema, strength at least 3/5, difficutly functionally with sit to stand  Cervical / Trunk Assessment: Normal  Communication   Communication: No difficulties  Cognition Arousal/Alertness: Awake/alert Behavior During Therapy: WFL for tasks assessed/performed Overall  Cognitive Status: Within Functional Limits for tasks assessed                      General Comments General comments (skin integrity, edema, etc.): toes with blisters and purple or gray color    Exercises        Assessment/Plan    PT Assessment Patient needs continued PT services  PT Diagnosis Difficulty walking;Generalized weakness   PT Problem List Decreased strength;Decreased knowledge of use of DME;Decreased balance;Decreased mobility;Cardiopulmonary status limiting activity;Decreased activity tolerance  PT Treatment Interventions DME instruction;Balance training;Gait training;Functional mobility training;Patient/family education;Therapeutic activities;Therapeutic exercise;Stair training   PT Goals (Current goals can be found in the Care Plan section) Acute Rehab PT Goals Patient Stated Goal: To go home PT Goal Formulation: With patient/family Time For Goal Achievement: 09/25/15 Potential to Achieve Goals: Good    Frequency Min 3X/week   Barriers to discharge        Co-evaluation               End of Session Equipment Utilized During Treatment: Gait belt;Oxygen Activity Tolerance: Patient limited by fatigue Patient left: in chair;with call bell/phone within reach           Time: 1055-1150 PT Time Calculation (min) (ACUTE ONLY): 55 min   Charges:   PT Evaluation $PT Eval High Complexity: 1 Procedure PT Treatments $Gait Training: 8-22 mins $Therapeutic Activity: 8-22 mins   PT G Codes:        [name] 10/17/2015, 1:39 PM  Sheran Lawless, PT [phone] 17-Oct-2015

## 2015-09-18 NOTE — Progress Notes (Signed)
[facility] PROGRESS NOTE  James Bennett VQQ:[id number] DOB: [birth year] DOA: 09/14/2015 PCP: No primary care provider on file.  Length of stay: 4   Assessment/Plan: Active Problems:   Lobar pneumonia (James Bennett)   Sepsis (James Bennett)   Encounter for central line placement   Lower extremity edema   Acute respiratory failure (James Bennett)   Cardiomyopathy (James Bennett)   Peripheral ischemia   Brief summary 20 y.o. male with really no past medical history who presented to an urgent care with a 3 week history of shortness of breath and coughing. He had undergone rounds of antibiotics and prednisone with not much relief. He also was complaining of bilateral lower leg swelling and eventually what prompted him to come to attention were toes being dark in color. Upon initial evaluation he was found to have a lowish blood pressure of 110- which did drop into the 90s- was found to have a creatinine initially of 1.17 which worsened to 1.3 and also an elevated white blood count at 20,000, elevated lactate at over 8 which has decreased. Urinalysis shows granular casts but really minimal protein and no hematuria. Patient has been hydrated aggressively. His urine output is marginal- then today, echo shows ejection fraction of 15% raising question of possible infectious cardiomyopathy? Has been started on milrinone. CK is 1100.   Assessment and plan Acute systolic heart failure, EF of 15% Concern for viral myocarditis vs autoimmune source , so far rheumatologic workup has been normal. Heart failure team consulted, patient was started on milrinone to facilitate diuresis. Patient is now off milrinone. Status post right heart cath on 2/23 showed markedly elevated filling pressure. Continue Lasix 80 mg IV twice a day, digoxin 0.25, Aldactone,  lisinopril, cardiology considering performing a cardiac MRI but may be too large for MRI  Acute hypoxemic respiratory failure secondary to pulmonary edema vs autoimmune pneumonitis, CT  chest showing nodular consolidations. Discussed with Dr. Elsworth Soho  okay to DC IV steroids at this point  Severe sepsis 2/2 ?CAP - qSOFA neg but meets SIRS criteria with HR, lactic acid, and leukocytosis Abx as above (vanc/zoysn) started 2/20- can d/c tomorrow if cultures negative  and switched to Augmentin 7 days   Rash: Petechial rash on lower legs, platelets not significantly low.Doubt vasculitis, ANCA negative. Acute hepatitis panel negative, HIV antibody nonreactive     Ischemic toes: ?Low flow/low cardiac output => think unlikely to be cardioembolic with symmetry. No LV thrombus noted on echo. He has dopplerable lower extremity pulses. Vascular has evaluated, recommend short course of anticoagulation. Arterial Doppler within normal limits. Patient currently on a heparin drip,Vascular has evaluated, recommend short course of anticoagulation, will defer choice of  anticoagulation to cardiology  Elevated LFTs:improving ,  Prominent elevated bilirubin. Suspect this may be related to congestive hepatopathy. LFTs coming down.    AKI: Evaluated by renal, do not think there is autoimmune process involving kidneys, probably cardiorenal situation. Creatinine now normal. ANCA neg.Elevated CRP, normal ESR.  autoimmune serologies (all negative so far).  Rule out cryoglobulinemia, ordered cryoglobulins which is pending   Severe hypokalemia and hypomagnesemia-replete aggressively and recheck   DVT prophylaxsis  heparin drip  Code Status:      Code Status Orders        Start     Ordered   09/14/15 2222  Full code   Continuous     09/14/15 2228    Code Status History    Date Active Date Inactive Code Status Order ID Comments  User Context   This patient has a current code status but no historical code status.      Family Communication: Discussed in detail with the patient and his parents, all imaging results, lab results explained to the patient   Disposition Plan:  Continue patient  stepdown, encourage patient to mobilize    Consultants:  Cardiology  Nephrology  Vascular    Procedures:  None  Antibiotics: Anti-infectives    Start     Dose/Rate Route Frequency Ordered Stop   09/15/15 0800  vancomycin (VANCOCIN) 1,500 mg in sodium chloride 0.9 % 500 mL IVPB     1,500 mg 250 mL/hr over 120 Minutes Intravenous Every 8 hours 09/14/15 1816     09/15/15 0100  piperacillin-tazobactam (ZOSYN) IVPB 3.375 g     3.375 g 12.5 mL/hr over 240 Minutes Intravenous Every 8 hours 09/14/15 1650     09/14/15 1907  vancomycin (VANCOCIN) 500 MG powder    Comments:  Eulogio Ditch   : cabinet override      09/14/15 1907 09/14/15 1910   09/14/15 1900  vancomycin (VANCOCIN) 500 mg in sodium chloride 0.9 % 100 mL IVPB     500 mg 100 mL/hr over 60 Minutes Intravenous  Once 09/14/15 1714     09/14/15 1800  vancomycin (VANCOCIN) 1,500 mg in sodium chloride 0.9 % 500 mL IVPB  Status:  Discontinued     1,500 mg 250 mL/hr over 120 Minutes Intravenous  Once 09/14/15 1647 09/14/15 1713   09/14/15 1800  vancomycin (VANCOCIN) IVPB 1000 mg/200 mL premix     1,000 mg 200 mL/hr over 60 Minutes Intravenous  Once 09/14/15 1714 09/14/15 1909   09/14/15 1700  vancomycin (VANCOCIN) IVPB 1000 mg/200 mL premix     1,000 mg 200 mL/hr over 60 Minutes Intravenous  Once 09/14/15 1647 09/14/15 1812   09/14/15 1630  piperacillin-tazobactam (ZOSYN) IVPB 3.375 g     3.375 g 100 mL/hr over 30 Minutes Intravenous  Once 09/14/15 1620 09/14/15 1707   09/14/15 1630  vancomycin (VANCOCIN) IVPB 1000 mg/200 mL premix  Status:  Discontinued     1,000 mg 200 mL/hr over 60 Minutes Intravenous  Once 09/14/15 1620 09/14/15 1646         HPI/Subjective: Patient has had significant improvement in the shortness of breath. Would like to ambulate   Objective: Filed Vitals:   09/18/15 0215 09/18/15 0443 09/18/15 0445 09/18/15 0447  BP: 110/61  121/89   Pulse: 125  107   Temp:  97.9 F (36.6 C)    TempSrc:   Oral    Resp: 23  30   Height:      Weight:    148.326 kg (327 lb)  SpO2: 96%  97%     Intake/Output Summary (Last 24 hours) at 09/18/15 0837 Last data filed at 09/18/15 0600  Gross per 24 hour  Intake 3620.33 ml  Output   8900 ml  Net -5279.67 ml    Exam: General: Well appearing. No resp difficulty. In bed  HEENT: normal. [facility]  Neck: Thick. JVP 10+. Carotids 2+ bilat; no bruits. No lymphadenopathy or thryomegaly appreciated. Cor: PMI nondisplaced. Mildly tachy, regular rate & rhythm. +S3. No murmur.  Lungs: clear Abdomen: obese, soft, nontender, nondistended. No hepatosplenomegaly. No bruits or masses. Good bowel sounds. Extremities: no cyanosis, clubbing, rash, R and LLE 2+ edema. R and L toes purple with some improvement today.  Neuro: alert & orientedx3, cranial nerves grossly intact. moves all 4 extremities w/o  difficulty. Affect pleasant  Data Review   Micro Results Recent Results (from the past 240 hour(s))  Culture, blood (routine x 2)     Status: None (Preliminary result)   Collection Time: 09/14/15  3:48 PM  Result Value Ref Range Status   Specimen Description BLOOD LEFT FOREARM  Final   Special Requests BOTTLES DRAWN AEROBIC AND ANAEROBIC 5CC EACH  Final   Culture   Final    NO GROWTH 3 DAYS Performed at [facility]    Report Status PENDING  Incomplete  Culture, blood (routine x 2)     Status: None (Preliminary result)   Collection Time: 09/14/15  4:30 PM  Result Value Ref Range Status   Specimen Description BLOOD LEFT ANTECUBITAL  Final   Special Requests BOTTLES DRAWN AEROBIC AND ANAEROBIC 6 CC EACH  Final   Culture   Final    NO GROWTH 3 DAYS Performed at [facility]    Report Status PENDING  Incomplete  MRSA PCR Screening     Status: None   Collection Time: 09/14/15  9:45 PM  Result Value Ref Range Status   MRSA by PCR NEGATIVE NEGATIVE Final    Comment:        The GeneXpert MRSA Assay (FDA approved for NASAL  specimens only), is one component of a comprehensive MRSA colonization surveillance program. It is not intended to diagnose MRSA infection nor to guide or monitor treatment for MRSA infections.   Respiratory virus panel     Status: None   Collection Time: 09/14/15 11:05 PM  Result Value Ref Range Status   Source - RVPAN NASOPHARYNGEAL  Corrected   Respiratory Syncytial Virus A Comment Negative Final    Comment: (NOTE) We are UNABLE to reliably determine a result for the specimen due to the presence of PCR inhibitor(s) in the specimen submitted.  If clinically indicated, please recollect an additional specimen for testing.    Respiratory Syncytial Virus B Comment Negative Final    Comment: (NOTE) We are UNABLE to reliably determine a result for the specimen due to the presence of PCR inhibitor(s) in the specimen submitted.  If clinically indicated, please recollect an additional specimen for testing.    Influenza A Comment Negative Final    Comment: (NOTE) We are UNABLE to reliably determine a result for the specimen due to the presence of PCR inhibitor(s) in the specimen submitted.  If clinically indicated, please recollect an additional specimen for testing.    Influenza B Comment Negative Final    Comment: (NOTE) We are UNABLE to reliably determine a result for the specimen due to the presence of PCR inhibitor(s) in the specimen submitted.  If clinically indicated, please recollect an additional specimen for testing.    Parainfluenza 1 Comment Negative Final    Comment: (NOTE) We are UNABLE to reliably determine a result for the specimen due to the presence of PCR inhibitor(s) in the specimen submitted.  If clinically indicated, please recollect an additional specimen for testing.    Parainfluenza 2 Comment Negative Final    Comment: (NOTE) We are UNABLE to reliably determine a result for the specimen due to the presence of PCR inhibitor(s) in the specimen  submitted.  If clinically indicated, please recollect an additional specimen for testing.    Parainfluenza 3 Comment Negative Final    Comment: (NOTE) We are UNABLE to reliably determine a result for the specimen due to the presence of PCR inhibitor(s) in the specimen submitted.  If  clinically indicated, please recollect an additional specimen for testing.    Metapneumovirus Comment Negative Final    Comment: (NOTE) We are UNABLE to reliably determine a result for the specimen due to the presence of PCR inhibitor(s) in the specimen submitted.  If clinically indicated, please recollect an additional specimen for testing.    Rhinovirus Comment Negative Final    Comment: (NOTE) We are UNABLE to reliably determine a result for the specimen due to the presence of PCR inhibitor(s) in the specimen submitted.  If clinically indicated, please recollect an additional specimen for testing.    Adenovirus Comment Negative Final    Comment: (NOTE) We are UNABLE to reliably determine a result for the specimen due to the presence of PCR inhibitor(s) in the specimen submitted.  If clinically indicated, please recollect an additional specimen for testing. Performed At: [facility] [place], [place] [id number] Lindon Romp MD GE:[id number]   Urine culture     Status: None   Collection Time: 09/14/15 11:32 PM  Result Value Ref Range Status   Specimen Description URINE, CLEAN CATCH  Final   Special Requests NONE  Final   Culture NO GROWTH 1 DAY  Final   Report Status 09/16/2015 FINAL  Final    Radiology Reports Ct Chest Wo Contrast  09/15/2015  CLINICAL DATA:  Subacute onset of cough. Sepsis, and body rash. Initial encounter. EXAM: CT CHEST WITHOUT CONTRAST TECHNIQUE: Multidetector CT imaging of the chest was performed following the standard protocol without IV contrast. COMPARISON:  Chest radiograph performed 09/14/2015 FINDINGS: Fluffy bilateral airspace  opacification is noted, most prominent at the lower lung lobes. Trace bilateral pleural effusions are seen. Findings are compatible with bilateral pneumonia. Underlying septic emboli cannot be excluded, given the peripheral distribution. No dominant mass is seen. No pneumothorax is identified. A 1.3 cm precarinal node is noted. No additional mediastinal lymphadenopathy is seen. No pericardial effusion is identified. Residual thymic tissue is grossly unremarkable. The visualized portions of thyroid gland are unremarkable. No axillary lymphadenopathy is seen. Vague soft tissue inflammation is noted tracking along the central chest wall, of uncertain significance. Diffuse soft tissue swelling is also noted tracking along the right axilla and right lateral chest wall. Mild underlying soft tissue edema is noted about the upper abdomen. Mild bilateral gynecomastia is noted. Trace ascites is noted surrounding the liver and spleen. The visualized portions of the liver and spleen are grossly unremarkable. The visualized portions of the pancreas, adrenal glands and kidneys are within normal limits. No acute osseous abnormalities are identified. IMPRESSION: 1. Fluffy bilateral airspace opacification, most prominent at the lower lung lobes. Trace bilateral pleural effusions seen. Findings compatible with bilateral pneumonia. 2. Cannot exclude underlying septic emboli, given the peripheral distribution of airspace opacities. 3. 1.3 cm precarinal node may reflect the underlying infection. Four diffuse soft tissue swelling along the right axilla and right lateral chest wall, with vague soft tissue inflammation along the central chest wall. Mild soft tissue edema about the upper abdomen. 4. Trace ascites noted about the liver and spleen. 5. Mild bilateral gynecomastia noted. Electronically Signed   By: Garald Balding M.D.   On: 09/15/2015 00:47   US Venous Img Lower Bilateral  09/14/2015  CLINICAL DATA:  bilateral lower  extremity swelling x1 0.5 weeks EXAM: BILATERAL LOWER EXTREMITY VENOUS DOPPLER ULTRASOUND TECHNIQUE: Gray-scale sonography with compression, as well as color and duplex ultrasound, were performed to evaluate the deep venous system from the level of the common femoral  vein through the popliteal and proximal calf veins. COMPARISON:  None FINDINGS: Normal compressibility of the common femoral, superficial femoral, and popliteal veins, as well as the proximal calf veins. No filling defects to suggest DVT on grayscale or color Doppler imaging. Doppler waveforms show normal direction of venous flow, normal respiratory phasicity and response to augmentation. Marked subcutaneous edema right thigh, left ankle. Survey views of the contralateral common femoral vein are unremarkable. IMPRESSION: 1. No evidence of lower extremity deep vein thrombosis, BILATERALLY. Electronically Signed   By: Lucrezia Europe M.D.   On: 09/14/2015 18:27   Dg Chest Port 1 View  09/16/2015  CLINICAL DATA:  Acute respiratory failure EXAM: PORTABLE CHEST 1 VIEW COMPARISON:  September 15, 2015 FINDINGS: Central catheter tip is in the superior vena cava near the cavoatrial junction, stable. No pneumothorax. There is mild atelectasis in the left perihilar region. There is persistent patchy consolidation in the left base. Right lung base now clear. Lungs elsewhere clear. Heart is mildly enlarged with pulmonary vascularity within normal limits. No adenopathy evident. IMPRESSION: Interval clearing of patchy opacity right base. Patchy infiltrate left base stable. Atelectasis left perihilar region. No change in cardiac silhouette. No change in central catheter placement. No pneumothorax peer Electronically Signed   By: Lowella Grip III M.D.   On: 09/16/2015 07:14   Dg Chest Port 1 View  09/15/2015  CLINICAL DATA:  20 year old male central line placement. Initial encounter. EXAM: PORTABLE CHEST 1 VIEW COMPARISON:  Chest CT 0011 hours today and earlier.  FINDINGS: Portable AP supine view at 1548 hours. Left IJ approach central venous catheter. Tip projects at the cavoatrial junction level. No pneumothorax. Continued confluent lung base airspace opacity. Stable cardiac size and mediastinal contours. Visualized tracheal air column is within normal limits. No pleural effusion identified. IMPRESSION: 1. Left IJ approach central line placed, with tip at the cavoatrial junction level and no adverse features. 2. Bilateral pneumonia re- demonstrated. Electronically Signed   By: Genevie Ann M.D.   On: 09/15/2015 16:14   Dg Chest Port 1 View  09/14/2015  CLINICAL DATA:  20 year old with 2 week history of shortness of breath and 1-1/2 week history of bilateral lower extremity edema. Recent diagnosis of bronchitis. EXAM: PORTABLE CHEST 1 VIEW COMPARISON:  None. FINDINGS: Cardiac silhouette upper normal in size to slightly enlarged even allowing for AP portable technique. Airspace consolidation in the right upper lobe, right lower lobe, and lingula. No pleural effusions. Normal pulmonary vascularity. IMPRESSION: 1. Multilobar pneumonia involving the right upper lobe, right lower lobe and lingula. 2. Mild cardiomegaly. Electronically Signed   By: Evangeline Dakin M.D.   On: 09/14/2015 17:17     CBC  Recent Labs Lab 09/14/15 1610 09/14/15 2344 09/15/15 0243 09/16/15 0030 09/17/15 0410 09/18/15 0400  WBC 20.0* 18.0* 17.8* 12.2* 14.4* 15.2*  HGB 13.9 12.9* 12.5* 11.8* 11.2* 12.2*  HCT 43.0 40.8 39.1 35.8* 34.9* 37.5*  PLT PLATELET CLUMPS NOTED ON SMEAR, UNABLE TO ESTIMATE 162 141* 120* 133* 145*  MCV 81.0 82.9 82.3 81.7 80.4 83.3  MCH 26.2 26.2 26.3 26.9 25.8* 27.1  MCHC 32.3 31.6 32.0 33.0 32.1 32.5  RDW 19.5* 17.6* 17.4* 17.8* 18.4* 19.3*  LYMPHSABS 1.2  --   --   --   --   --   MONOABS 1.8*  --   --   --   --   --   EOSABS 0.0  --   --   --   --   --  BASOSABS 0.0  --   --   --   --   --     Chemistries   Recent Labs Lab 09/14/15 1650 09/14/15 2344  09/15/15 0243 09/15/15 1059 09/16/15 0030 09/17/15 0410 09/17/15 1544 09/17/15 1545 09/18/15 0400  NA 132* 133* 133*  --  134* 141  --  140 140  K 5.9* 6.6* 5.6*  --  3.3* 2.8*  --  2.4* 3.2*  CL 101 100* 100*  --  98* 99*  --  96* 96*  CO2 16* 13* 19*  --  25 32  --  30 34*  GLUCOSE 126* 82 110*  --  165* 133*  --  228* 130*  BUN 52* 54* 50*  --  47* 33*  --  33* 25*  CREATININE 1.17 1.35* 1.33*  --  1.34* 1.02  --  1.22 0.81  CALCIUM 8.1* 8.7* 8.5*  --  7.5* 7.6*  --  7.9* 7.5*  MG  --  2.1 1.8  --  1.6* 1.7 1.7  --   --   AST 77* 78* 66*  --  62*  --   --   --  134*  ALT 66* 66* 60  --  54  --   --   --  97*  ALKPHOS 82 79 71  --  67  --   --   --  73  BILITOT 3.3* 3.1* 3.3* 4.3* 3.3*  --   --   --  2.6*   ------------------------------------------------------------------------------------------------------------------ estimated creatinine clearance is 223.5 mL/min (by C-G formula based on Cr of 0.81). ------------------------------------------------------------------------------------------------------------------ No results for input(s): HGBA1C in the last 72 hours. ------------------------------------------------------------------------------------------------------------------ No results for input(s): CHOL, HDL, LDLCALC, TRIG, CHOLHDL, LDLDIRECT in the last 72 hours. ------------------------------------------------------------------------------------------------------------------ No results for input(s): TSH, T4TOTAL, T3FREE, THYROIDAB in the last 72 hours.  Invalid input(s): FREET3 ------------------------------------------------------------------------------------------------------------------ No results for input(s): VITAMINB12, FOLATE, FERRITIN, TIBC, IRON, RETICCTPCT in the last 72 hours.  Coagulation profile  Recent Labs Lab 09/15/15 0243 09/15/15 1614 09/16/15 0528 09/17/15 0410 09/18/15 0400  INR 2.09* 2.22* 2.19* 1.61* 1.44    No results for input(s):  DDIMER in the last 72 hours.  Cardiac Enzymes  Recent Labs Lab 09/14/15 2344 09/15/15 1059  TROPONINI 0.05* 0.08*   ------------------------------------------------------------------------------------------------------------------ Invalid input(s): POCBNP   CBG:  Recent Labs Lab 09/14/15 1547 09/14/15 2205 09/15/15 1141  GLUCAP 139* 84 91       Studies: No results found.    No results found for: HGBA1C Lab Results  Component Value Date   CREATININE 0.81 09/18/2015       Scheduled Meds: . antiseptic oral rinse  7 mL Mouth Rinse BID  . digoxin  0.25 mg Oral Daily  . famotidine  20 mg Oral BID  . furosemide  80 mg Intravenous BID  . lisinopril  2.5 mg Oral Daily  . magnesium oxide  400 mg Oral Daily  . methylPREDNISolone (SOLU-MEDROL) injection  80 mg Intravenous Q8H  . piperacillin-tazobactam (ZOSYN)  IV  3.375 g Intravenous Q8H  . potassium chloride  60 mEq Oral QID  . sodium chloride flush  3 mL Intravenous Q12H  . sodium chloride flush  3 mL Intravenous Q12H  . spironolactone  12.5 mg Oral Daily  . vancomycin  1,500 mg Intravenous Q8H  . vancomycin  500 mg Intravenous Once   Continuous Infusions: . sodium chloride Stopped (09/16/15 0942)  . heparin 2,300 Units/hr (09/18/15 5701)    Active Problems:   Lobar  pneumonia ([place])   Sepsis ([place])   Encounter for central line placement   Lower extremity edema   Acute respiratory failure ([place])   Cardiomyopathy ([place])   Peripheral ischemia    Time spent: 45 minutes   [facility] Pager [phone]. If 7PM-7AM, please contact night-coverage at [email or web address], password [facility] 09/18/2015, 8:37 AM  LOS: 4 days

## 2015-09-18 NOTE — Progress Notes (Addendum)
[facility]  Subjective  - Resting comfortably    Objective 121/89 107 98.6 F (37 C) (Oral) 30 97%  Intake/Output Summary (Last 24 hours) at 09/18/15 0957 Last data filed at 09/18/15 0600  Gross per 24 hour  Intake 3620.33 ml  Output   8900 ml  Net -5279.67 ml   No change intoes bilaterally No change in plantar skin with continued dark discoloration Feet other wise warm, active range of motion intact   Assessment/Planning: Ischemic toes all 10 digits Heparin 2,300 units /hr Diuresing  Cr decreasing in normal range 0.81 INR stabilizing 1.44 Cardiac cath: 1. Relatively preserved cardiac output now off milrinone. 2. Markedly elevated right and left heart filling pressures. Not sure PCWP is completely accurate due to respiratory variation, but is certainly elevated.    Clinton Gallant [facility] 09/18/2015 9:57 AM --  Laboratory Lab Results:  Recent Labs  09/17/15 0410 09/18/15 0400  WBC 14.4* 15.2*  HGB 11.2* 12.2*  HCT 34.9* 37.5*  PLT 133* 145*   BMET  Recent Labs  09/17/15 1545 09/18/15 0400  NA 140 140  K 2.4* 3.2*  CL 96* 96*  CO2 30 34*  GLUCOSE 228* 130*  BUN 33* 25*  CREATININE 1.22 0.81  CALCIUM 7.9* 7.5*    COAG Lab Results  Component Value Date   INR 1.44 09/18/2015   INR 1.61* 09/17/2015   INR 2.19* 09/16/2015   No results found for: PTT    I agree with the above.  I have seen and examined the patient.  The toes appear about the same.  I spoke with the patient and his mother about the potential for losing some toes.  I will let these demarcate prior to proceeding. In an effort to salvage as much as possible.  We did discuss that if he develops infection, this could alter our plans.   Durene Cal

## 2015-09-18 NOTE — Progress Notes (Signed)
ANTICOAGULATION CONSULT NOTE - Follow Up Consult  Pharmacy Consult for heparin Indication: possible embolic event  Labs:  Recent Labs  09/15/15 1059  09/16/15 0030 09/16/15 0528 09/17/15 0410 09/17/15 1545 09/17/15 2218 09/18/15 0400 09/18/15 0541  HGB  --   < > 11.8*  --  11.2*  --   --  12.2*  --   HCT  --   --  35.8*  --  34.9*  --   --  37.5*  --   PLT  --   --  120*  --  133*  --   --  145*  --   LABPROT  --   < >  --  24.2* 19.1*  --   --  17.6*  --   INR  --   < >  --  2.19* 1.61*  --   --  1.44  --   HEPARINUNFRC  --   --   --   --   --   --  <0.10*  --  0.28*  CREATININE  --   < > 1.34*  --  1.02 1.22  --  0.81  --   CKTOTAL 1116*  --   --   --   --   --   --   --   --   TROPONINI 0.08*  --   --   --   --   --   --   --   --   < > = values in this interval not displayed.   Assessment: 19yo male remains subtherapeutic on heparin after rate increase though now close to goal.  Goal of Therapy:  Heparin level 0.3-0.7 units/ml   Plan:  Will increase heparin gtt by 1-2 units/kg/hr to 2300 units/hr and check level in 6hr.  Vernard Gambles, PharmD, BCPS  09/18/2015,6:43 AM

## 2015-09-18 NOTE — Progress Notes (Signed)
Patient ID: James Bennett, male   DOB: 15-Jul-1996, 20 y.o.   MRN: [id number]    Advanced Heart Failure Rounding Note   Subjective:    Patient is feeling better.  CVP remains 15-17.  Diuresed well again yesterday, creatinine lower.  HR remains elevated in 110s-120, higher if moves around and SBP in 100s-120s.  Off milrinone, on digoxin 0.25.  Co-ox a bit lower today at 56%.   ECHO 09/15/2015 EF 15% IVC dilated RV mildly decreased.   RHC Procedural Findings (2/23): Hemodynamics (mmHg) RA mean 19 RV 54/19 PA 51/32, mean 45 PCWP mean 40 Oxygen saturations: PA 57% AO 96% Cardiac Output (Fick) 6.02  Cardiac Index (Fick) 2.24 Cardiac Output (Thermo) 6.88 Cardiac Index (Thermo) 2.56  Objective:   Weight Range:  Vital Signs:   Temp:  [97.9 F (36.6 C)-99.8 F (37.7 C)] 97.9 F (36.6 C) (02/24 0443) Pulse Rate:  [103-125] 107 (02/24 0445) Resp:  [19-33] 30 (02/24 0445) BP: (106-130)/(47-89) 121/89 mmHg (02/24 0445) SpO2:  [91 %-98 %] 97 % (02/24 0445) Weight:  [327 lb (148.326 kg)] 327 lb (148.326 kg) (02/24 0447) Last BM Date: 09/15/15  Weight change: Filed Weights   09/16/15 0500 09/17/15 0402 09/18/15 0447  Weight: 317 lb (143.79 kg) 330 lb (149.687 kg) 327 lb (148.326 kg)    Intake/Output:   Intake/Output Summary (Last 24 hours) at 09/18/15 0855 Last data filed at 09/18/15 0600  Gross per 24 hour  Intake 3620.33 ml  Output   8900 ml  Net -5279.67 ml     Physical Exam: General:  Well appearing. No resp difficulty. In bed  HEENT: normal. [facility]  Neck: Thick. JVP 10+. Carotids 2+ bilat; no bruits. No lymphadenopathy or thryomegaly appreciated. Cor: PMI nondisplaced. Mildly tachy, regular rate & rhythm. +S3.  No murmur.  Lungs: clear Abdomen: obese, soft, nontender, nondistended. No hepatosplenomegaly. No bruits or masses. Good bowel sounds. Extremities: no cyanosis, clubbing, rash. 1+ ankle edema. R and L toes purple with ongoing slow improvement.  Neuro: alert &  orientedx3, cranial nerves grossly intact. moves all 4 extremities w/o difficulty. Affect pleasant  Telemetry: Sinus Tach 110s-120s  Labs: Basic Metabolic Panel:  Recent Labs Lab 09/14/15 2344 09/15/15 0243 09/16/15 0030 09/17/15 0410 09/17/15 1544 09/17/15 1545 09/18/15 0400  NA 133* 133* 134* 141  --  140 140  K 6.6* 5.6* 3.3* 2.8*  --  2.4* 3.2*  CL 100* 100* 98* 99*  --  96* 96*  CO2 13* 19* 25 32  --  30 34*  GLUCOSE 82 110* 165* 133*  --  228* 130*  BUN 54* 50* 47* 33*  --  33* 25*  CREATININE 1.35* 1.33* 1.34* 1.02  --  1.22 0.81  CALCIUM 8.7* 8.5* 7.5* 7.6*  --  7.9* 7.5*  MG 2.1 1.8 1.6* 1.7 1.7  --   --   PHOS 5.3* 4.4 4.0  --   --   --   --     Liver Function Tests:  Recent Labs Lab 09/14/15 1650 09/14/15 2344 09/15/15 0243 09/15/15 1059 09/16/15 0030 09/18/15 0400  AST 77* 78* 66*  --  62* 134*  ALT 66* 66* 60  --  54 97*  ALKPHOS 82 79 71  --  67 73  BILITOT 3.3* 3.1* 3.3* 4.3* 3.3* 2.6*  PROT 6.2* 6.2* 5.7*  --  5.2* 5.1*  ALBUMIN 2.7* 2.6* 2.3*  --  2.1* 2.0*   No results for input(s): LIPASE, AMYLASE in the last 168 hours.  No results for input(s): AMMONIA in the last 168 hours.  CBC:  Recent Labs Lab 09/14/15 1610 09/14/15 2344 09/15/15 0243 09/16/15 0030 09/17/15 0410 09/18/15 0400  WBC 20.0* 18.0* 17.8* 12.2* 14.4* 15.2*  NEUTROABS 17.0*  --   --   --   --   --   HGB 13.9 12.9* 12.5* 11.8* 11.2* 12.2*  HCT 43.0 40.8 39.1 35.8* 34.9* 37.5*  MCV 81.0 82.9 82.3 81.7 80.4 83.3  PLT PLATELET CLUMPS NOTED ON SMEAR, UNABLE TO ESTIMATE 162 141* 120* 133* 145*    Cardiac Enzymes:  Recent Labs Lab 09/14/15 2344 09/15/15 1059  CKTOTAL  --  1116*  TROPONINI 0.05* 0.08*    BNP: BNP (last 3 results)  Recent Labs  09/14/15 1610  BNP 1312.6*    ProBNP (last 3 results) No results for input(s): PROBNP in the last 8760 hours.    Other results:  Imaging: No results found.   Medications:     Scheduled Medications: .  antiseptic oral rinse  7 mL Mouth Rinse BID  . digoxin  0.25 mg Oral Daily  . famotidine  20 mg Oral BID  . furosemide  80 mg Intravenous BID  . lisinopril  2.5 mg Oral BID  . magnesium oxide  400 mg Oral Daily  . piperacillin-tazobactam (ZOSYN)  IV  3.375 g Intravenous Q8H  . potassium chloride  60 mEq Oral QID  . sodium chloride flush  3 mL Intravenous Q12H  . sodium chloride flush  3 mL Intravenous Q12H  . spironolactone  25 mg Oral Daily  . vancomycin  1,500 mg Intravenous Q8H  . vancomycin  500 mg Intravenous Once    Infusions: . sodium chloride Stopped (09/16/15 0942)  . heparin 2,300 Units/hr (09/18/15 0643)    PRN Medications: sodium chloride, sodium chloride, sodium chloride, acetaminophen, ondansetron (ZOFRAN) IV, sodium chloride flush, sodium chloride flush   Assessment/Plan/ Discussion     1. Acute systolic CHF: EF 50% by echo with mildly dilated and mildly dysfunctional RV. Low output HF with marked volume overload initially.  Was on milrinone but able to titrate off.  [place] on 2/23 showed good cardiac output off milrinone but still very volume overloaded. I am concerned for viral myocarditis, has had viral-type symptoms for several weeks and has had antibiotic courses at home prior to admission. Also considering autoimmune source (so far, rheumatologic serologies are all normal).  Today, CVP remains high with co-ox 56%.  Feels great.  HR still elevated.  - Lasix 80 mg IV bid again today.  - Continue digoxin 0.25.   - Increase lisinopril to 2.5 bid and spironolactone to 25 daily.  Replace K. - Cardiac output good by cath, sinus tachy is going to limit ambulation.  I will try him on Corlanor 5 mg bid to see if this can control HR a bit.   - Would like to eventually get cardiac MRI but may be too large for MRI, may try to do this early next week.  2. Acute respiratory failure: Suspect pulmonary edema plays a role but cannot rule out autoimmune pneumonitis process =>  serologies all negative so far. He is on nasal cannula now with diuresis and feels much better.  3. Rash: Petechial rash on lower legs, platelets not significantly low. ?Vasculitis playing a role here but so far autoimmune workup negative.  4. Ischemic toes: Suspect due to low flow/low cardiac output => think unlikely to be cardioembolic with symmetry. No LV thrombus noted on echo. He  has dopplerable and now palpable lower extremity pulses.  Vascular has evaluated, recommend short course of anticoagulation.  Now that INR < 2, he is on heparin gtt.   5. Elevated LFTs: Prominent elevated bilirubin. Suspect this may be related to congestive hepatopathy. LFTs coming down.  6. ID: Covering with vancomycin/Zosyn, ? PNA component.  PCT was very elevated.  If cultures negative and remains afebrile, stop at 5 days.  7. Rheum: ?Autoimmune process like vasculitis. Elevated CRP, normal ESR. Autoimmune serologies all negative so far. Think steroids can be weaned, looks like internal medicine has stopped Solumedrol today.   8. AKI: Evaluated by renal, do not think there is autoimmune process involving kidneys, probably cardiorenal situation.  BUN/creatinine steadily improving.   Loralie Champagne 09/18/2015 8:55 AM

## 2015-09-19 DIAGNOSIS — I5021 Acute systolic (congestive) heart failure: Secondary | ICD-10-CM | POA: Diagnosis present

## 2015-09-19 DIAGNOSIS — R570 Cardiogenic shock: Secondary | ICD-10-CM | POA: Diagnosis present

## 2015-09-19 DIAGNOSIS — R945 Abnormal results of liver function studies: Secondary | ICD-10-CM

## 2015-09-19 DIAGNOSIS — R7989 Other specified abnormal findings of blood chemistry: Secondary | ICD-10-CM

## 2015-09-19 DIAGNOSIS — Z452 Encounter for adjustment and management of vascular access device: Secondary | ICD-10-CM

## 2015-09-19 LAB — CARBOXYHEMOGLOBIN
CARBOXYHEMOGLOBIN: 1.7 % — AB (ref 0.5–1.5)
CARBOXYHEMOGLOBIN: 1.3 % (ref 0.5–1.5)
CARBOXYHEMOGLOBIN: 1.5 % (ref 0.5–1.5)
CARBOXYHEMOGLOBIN: 2.2 % — AB (ref 0.5–1.5)
METHEMOGLOBIN: 0.7 % (ref 0.0–1.5)
Methemoglobin: 0.9 % (ref 0.0–1.5)
Methemoglobin: 1 % (ref 0.0–1.5)
Methemoglobin: 0.9 % (ref 0.0–1.5)
O2 SAT: 40.5 %
O2 SAT: 40.4 %
O2 SAT: 52 %
O2 Saturation: 33.5 %
TOTAL HEMOGLOBIN: 11.1 g/dL — AB (ref 13.5–18.0)
Total hemoglobin: 12.2 g/dL — ABNORMAL LOW (ref 13.5–18.0)
Total hemoglobin: 10.9 g/dL — ABNORMAL LOW (ref 13.5–18.0)
Total hemoglobin: 11 g/dL — ABNORMAL LOW (ref 13.5–18.0)

## 2015-09-19 LAB — COMPREHENSIVE METABOLIC PANEL
ALT: 140 U/L — AB (ref 17–63)
ANION GAP: 10 (ref 5–15)
AST: 143 U/L — ABNORMAL HIGH (ref 15–41)
Albumin: 2.1 g/dL — ABNORMAL LOW (ref 3.5–5.0)
Alkaline Phosphatase: 95 U/L (ref 38–126)
BUN: 25 mg/dL — ABNORMAL HIGH (ref 6–20)
CHLORIDE: 98 mmol/L — AB (ref 101–111)
CO2: 31 mmol/L (ref 22–32)
CREATININE: 0.85 mg/dL (ref 0.61–1.24)
Calcium: 7.6 mg/dL — ABNORMAL LOW (ref 8.9–10.3)
Glucose, Bld: 136 mg/dL — ABNORMAL HIGH (ref 65–99)
Potassium: 4.3 mmol/L (ref 3.5–5.1)
SODIUM: 139 mmol/L (ref 135–145)
Total Bilirubin: 3.7 mg/dL — ABNORMAL HIGH (ref 0.3–1.2)
Total Protein: 5.7 g/dL — ABNORMAL LOW (ref 6.5–8.1)

## 2015-09-19 LAB — CBC
HCT: 38 % — ABNORMAL LOW (ref 39.0–52.0)
Hemoglobin: 11.7 g/dL — ABNORMAL LOW (ref 13.0–17.0)
MCH: 25.7 pg — AB (ref 26.0–34.0)
MCHC: 30.8 g/dL (ref 30.0–36.0)
MCV: 83.5 fL (ref 78.0–100.0)
PLATELETS: 156 10*3/uL (ref 150–400)
RBC: 4.55 MIL/uL (ref 4.22–5.81)
RDW: 19.7 % — AB (ref 11.5–15.5)
WBC: 17.3 10*3/uL — ABNORMAL HIGH (ref 4.0–10.5)

## 2015-09-19 LAB — PROTIME-INR
INR: 1.48 (ref 0.00–1.49)
PROTHROMBIN TIME: 18 s — AB (ref 11.6–15.2)

## 2015-09-19 LAB — CULTURE, BLOOD (ROUTINE X 2)
CULTURE: NO GROWTH
CULTURE: NO GROWTH

## 2015-09-19 LAB — HEPARIN LEVEL (UNFRACTIONATED): Heparin Unfractionated: 0.47 IU/mL (ref 0.30–0.70)

## 2015-09-19 MED ORDER — MILRINONE IN DEXTROSE 20 MG/100ML IV SOLN
0.3750 ug/kg/min | INTRAVENOUS | Status: DC
  Administered 2015-09-19: 0.25 ug/kg/min via INTRAVENOUS
  Administered 2015-09-19 – 2015-09-23 (×15): 0.375 ug/kg/min via INTRAVENOUS
  Filled 2015-09-19 (×17): qty 100

## 2015-09-19 MED ORDER — FUROSEMIDE 10 MG/ML IJ SOLN
80.0000 mg | Freq: Three times a day (TID) | INTRAMUSCULAR | Status: AC
  Administered 2015-09-19 – 2015-09-23 (×14): 80 mg via INTRAVENOUS
  Filled 2015-09-19 (×14): qty 8

## 2015-09-19 MED ORDER — CITALOPRAM HYDROBROMIDE 20 MG PO TABS
10.0000 mg | ORAL_TABLET | Freq: Every day | ORAL | Status: DC
  Administered 2015-09-19 – 2015-10-01 (×13): 10 mg via ORAL
  Filled 2015-09-19 (×13): qty 1

## 2015-09-19 MED ORDER — ALPRAZOLAM 0.25 MG PO TABS: 0.2500 mg | ORAL_TABLET | ORAL | Status: DC | PRN

## 2015-09-19 NOTE — Progress Notes (Addendum)
Patient ID: James Bennett, male   DOB: [birth year], 20 y.o.   MRN: [id number]    Advanced Heart Failure Rounding Note   Subjective:    Coughing this am. Feels worse. Co-ox back down to 40%. 4L urine output yesterday but weight still up due tohigh fluid intake. HR up to 140 today. Corlanor started yesterday for tachycardia. CVP 20-21   ECHO 09/15/2015 EF 15% IVC dilated RV mildly decreased.   RHC Procedural Findings (2/23): Hemodynamics (mmHg) RA mean 19 RV 54/19 PA 51/32, mean 45 PCWP mean 40 Oxygen saturations: PA 57% AO 96% Cardiac Output (Fick) 6.02  Cardiac Index (Fick) 2.24 Cardiac Output (Thermo) 6.88 Cardiac Index (Thermo) 2.56  Objective:   Weight Range:  Vital Signs:   Temp:  [96.7 F (35.9 C)-100 F (37.8 C)] 97.2 F (36.2 C) (02/25 0808) Pulse Rate:  [117-143] 133 (02/25 0808) Resp:  [15-31] 15 (02/25 0808) BP: (112-124)/(56-89) 115/89 mmHg (02/25 0808) SpO2:  [94 %-97 %] 95 % (02/25 0808) Weight:  [148.8 kg (328 lb 0.7 oz)-150.7 kg (332 lb 3.7 oz)] 150.7 kg (332 lb 3.7 oz) (02/25 0500) Last BM Date: 09/18/15  Weight change: Filed Weights   09/18/15 0447 09/18/15 1500 09/19/15 0500  Weight: 148.326 kg (327 lb) 148.8 kg (328 lb 0.7 oz) 150.7 kg (332 lb 3.7 oz)    Intake/Output:   Intake/Output Summary (Last 24 hours) at 09/19/15 0826 Last data filed at 09/19/15 0600  Gross per 24 hour  Intake 3054.8 ml  Output   4051 ml  Net -996.2 ml     Physical Exam: General:  Lying in be. Weak and ill appearing. coughing HEENT: normal. [facility]  Neck: Thick. JVP 20. Carotids 2+ bilat; no bruits. No lymphadenopathy or thryomegaly appreciated. Cor: PMI laterally displaced.tachy, regular rate & rhythm. +S3.  No murmur.  Lungs: clear anteriorly Abdomen: obese, soft, nontender, nondistended. No hepatosplenomegaly. No bruits or masses. Good bowel sounds. Extremities: no cyanosis, clubbing, rash. 2-3+ ankle edema. R and L toes purple with ongoing slow improvement.  Neuro:  alert & orientedx3, cranial nerves grossly intact. moves all 4 extremities w/o difficulty. Affect pleasant  Telemetry: Sinus Tach 140s  Labs: Basic Metabolic Panel:  Recent Labs Lab 09/14/15 2344 09/15/15 0243 09/16/15 0030 09/17/15 0410 09/17/15 1544 09/17/15 1545 09/18/15 0400 09/19/15 0520  NA 133* 133* 134* 141  --  140 140 139  K 6.6* 5.6* 3.3* 2.8*  --  2.4* 3.2* 4.3  CL 100* 100* 98* 99*  --  96* 96* 98*  CO2 13* 19* 25 32  --  30 34* 31  GLUCOSE 82 110* 165* 133*  --  228* 130* 136*  BUN 54* 50* 47* 33*  --  33* 25* 25*  CREATININE 1.35* 1.33* 1.34* 1.02  --  1.22 0.81 0.85  CALCIUM 8.7* 8.5* 7.5* 7.6*  --  7.9* 7.5* 7.6*  MG 2.1 1.8 1.6* 1.7 1.7  --   --   --   PHOS 5.3* 4.4 4.0  --   --   --   --   --     Liver Function Tests:  Recent Labs Lab 09/14/15 2344 09/15/15 0243 09/15/15 1059 09/16/15 0030 09/18/15 0400 09/19/15 0520  AST 78* 66*  --  62* 134* 143*  ALT 66* 60  --  54 97* 140*  ALKPHOS 79 71  --  67 73 95  BILITOT 3.1* 3.3* 4.3* 3.3* 2.6* 3.7*  PROT 6.2* 5.7*  --  5.2* 5.1* 5.7*  ALBUMIN 2.6*  2.3*  --  2.1* 2.0* 2.1*   No results for input(s): LIPASE, AMYLASE in the last 168 hours. No results for input(s): AMMONIA in the last 168 hours.  CBC:  Recent Labs Lab 09/14/15 1610  09/15/15 0243 09/16/15 0030 09/17/15 0410 09/18/15 0400 09/19/15 0520  WBC 20.0*  < > 17.8* 12.2* 14.4* 15.2* 17.3*  NEUTROABS 17.0*  --   --   --   --   --   --   HGB 13.9  < > 12.5* 11.8* 11.2* 12.2* 11.7*  HCT 43.0  < > 39.1 35.8* 34.9* 37.5* 38.0*  MCV 81.0  < > 82.3 81.7 80.4 83.3 83.5  PLT PLATELET CLUMPS NOTED ON SMEAR, UNABLE TO ESTIMATE  < > 141* 120* 133* 145* 156  < > = values in this interval not displayed.  Cardiac Enzymes:  Recent Labs Lab 09/14/15 2344 09/15/15 1059  CKTOTAL  --  1116*  TROPONINI 0.05* 0.08*    BNP: BNP (last 3 results)  Recent Labs  09/14/15 1610  BNP 1312.6*    ProBNP (last 3 results) No results for input(s):  PROBNP in the last 8760 hours.    Other results:  Imaging: No results found.   Medications:     Scheduled Medications: . antiseptic oral rinse  7 mL Mouth Rinse BID  . digoxin  0.25 mg Oral Daily  . famotidine  20 mg Oral BID  . furosemide  80 mg Intravenous BID  . ivabradine  5 mg Oral BID WC  . lisinopril  2.5 mg Oral BID  . magnesium oxide  400 mg Oral Daily  . piperacillin-tazobactam (ZOSYN)  IV  3.375 g Intravenous Q8H  . polyethylene glycol  17 g Oral Daily  . sodium chloride flush  10-40 mL Intracatheter Q12H  . spironolactone  25 mg Oral Daily  . vancomycin  1,500 mg Intravenous Q12H    Infusions: . sodium chloride Stopped (09/16/15 0942)  . heparin 2,400 Units/hr (09/19/15 0453)    PRN Medications: sodium chloride, acetaminophen, ondansetron (ZOFRAN) IV, sodium chloride flush, zolpidem   Assessment/Plan/ Discussion     1. Cardiogenic shock 2. Acute systolic CHF: EF 62% by echo with mildly dilated and mildly dysfunctional RV. Low output HF with marked volume overload initially.  Was on milrinone but able to titrate off.  [place] on 2/23 showed good cardiac output off milrinone but still very volume overloaded. I am concerned for viral myocarditis, has had viral-type symptoms for several weeks and has had antibiotic courses at home prior to admission. Also considering autoimmune source (so far, rheumatologic serologies are all normal).  May need to consider endomyocardial bx at some point.  - Co-ox back down to day with recurrent cardiogenic shock. Volume status markedly elevated. Restart milrinone. Increase lasix to 80 IV tid. Discussed need for fluid restriction.  - I briefly discussed possible need for mechanical support with him. RV mildly reduced on echo. RA/PCWP on cath < 0.50 - Continue digoxin 0.25.   - Continue lisinopril to 2.5 bid and spironolactone to 25 daily.  Replace K. - Need to be careful with ivabradine in setting of acute shock.    - Would like  to eventually get cardiac MRI but may be too large for MRI, may try to do this early next week.  3. Acute respiratory failure: Suspect pulmonary edema plays a role but cannot rule out autoimmune pneumonitis process => serologies all negative so far. He is on nasal cannula now with diuresis 4. Rash:  Petechial rash on lower legs, platelets not significantly low. ?Vasculitis playing a role here but so far autoimmune workup negative.  5. Ischemic toes: Suspect due to low flow/low cardiac output => think unlikely to be cardioembolic with symmetry. No LV thrombus noted on echo. He has dopplerable and now palpable lower extremity pulses.  Vascular has evaluated, recommend short course of anticoagulation.  Now that INR < 2, he is on heparin gtt.   6. Elevated LFTs: Prominent elevated bilirubin. Suspect this may be related to congestive hepatopathy. LFTs werecoming down now back up due to recurrent shock.  7. ID: Covering with vancomycin/Zosyn, ? PNA component.  PCT was very elevated.  If cultures negative and remains afebrile, stop at 5 days.  8. Rheum: ?Autoimmune process like vasculitis. Elevated CRP, normal ESR. Autoimmune serologies all negative so far.Now off steroids 8. AKI: Evaluated by renal, do not think there is autoimmune process involving kidneys, probably cardiorenal situation.  BUN/creatinine now normal.  The patient is critically ill with multiple organ systems failure and requires high complexity decision making for assessment and support, frequent evaluation and titration of therapies, application of advanced monitoring technologies and extensive interpretation of multiple databases.   Critical Care Time devoted to patient care services described in this note is 45 Minutes.    Glori Bickers MD 09/19/2015 8:26 AM

## 2015-09-19 NOTE — Progress Notes (Signed)
[facility] Progress Note  Patient: James Bennett KPQ:[id number]   PCP: No primary care provider on file. DOB: 07-25-1996   DOA: 09/14/2015   DOS: 09/19/2015   Date of Service: the patient was seen and examined on 09/19/2015  Subjective: patient is a 39 has shortness of breath and cough. No chest pain and abdominal pain Nutrition: tolerating oral diet Activity: mostly bedridden Last BM: 09/18/2015  Assessment and Plan: 1. Acute systolic CHF (congestive heart failure) ([place]) The patient presented with complaints of bilateral leg swelling as well as discoloration along with cough and shortness of breath. He was found to be having cardiogenic shock with EF of 15%. Cartilage was consulted. At present suspect viral myocarditis. Patient was initially given IV Lasix but no improvement and therefore was started on Primacor drip. After discontinuing the drug the patient's LFT as well as volume status worsen again and patient is started on Primacor drip today again Also of Lasix every 8 hours. We will monitor ins and outs and volume status.  2.sepsis. Patient met sepsis criteria on admission prednisone currently on vancomycin and Zosyn. Cultures so far are negative. We will get viral antigens. Respiratory PCR is also ordered.  3. Peripheral vascular disease. suspecting cardio embolic phenomenon. Patient has discoloration of toes bilaterally Vascular surgery is following the patient and recommends that pt may need amputation later on. Will monitor clinically and currently on heparin  4. Elevated LFT. We'll get ultrasound of the abdomen. Likely congestive hepatopathy   5. lower extremity rash. vasculitis workup so far has been negative. We'll get further workup  For infectious etiology   DVT Prophylaxis: on therapeutic anticoagulation. Nutrition: cardiac diet Advance goals of care discussion: ful code  Brief Summary of Hospitalization:  Procedures: TTE Consultants: cardiology, PCCM  primary admission, vascular surgery  Antibiotics: Anti-infectives    Start     Dose/Rate Route Frequency Ordered Stop   09/19/15 0007  vancomycin (VANCOCIN) 1,500 mg in sodium chloride 0.9 % 500 mL IVPB     1,500 mg 250 mL/hr over 120 Minutes Intravenous Every 12 hours 09/18/15 1325     09/15/15 0800  vancomycin (VANCOCIN) 1,500 mg in sodium chloride 0.9 % 500 mL IVPB  Status:  Discontinued     1,500 mg 250 mL/hr over 120 Minutes Intravenous Every 8 hours 09/14/15 1816 09/18/15 1325   09/15/15 0100  piperacillin-tazobactam (ZOSYN) IVPB 3.375 g     3.375 g 12.5 mL/hr over 240 Minutes Intravenous Every 8 hours 09/14/15 1650     09/14/15 1907  vancomycin (VANCOCIN) 500 MG powder    Comments:  Eulogio Ditch   : cabinet override      09/14/15 1907 09/14/15 1910   09/14/15 1900  vancomycin (VANCOCIN) 500 mg in sodium chloride 0.9 % 100 mL IVPB  Status:  Discontinued     500 mg 100 mL/hr over 60 Minutes Intravenous  Once 09/14/15 1714 09/18/15 1325   09/14/15 1800  vancomycin (VANCOCIN) 1,500 mg in sodium chloride 0.9 % 500 mL IVPB  Status:  Discontinued     1,500 mg 250 mL/hr over 120 Minutes Intravenous  Once 09/14/15 1647 09/14/15 1713   09/14/15 1800  vancomycin (VANCOCIN) IVPB 1000 mg/200 mL premix     1,000 mg 200 mL/hr over 60 Minutes Intravenous  Once 09/14/15 1714 09/14/15 1909   09/14/15 1700  vancomycin (VANCOCIN) IVPB 1000 mg/200 mL premix     1,000 mg 200 mL/hr over 60 Minutes Intravenous  Once 09/14/15 1647 09/14/15 1812  09/14/15 1630  piperacillin-tazobactam (ZOSYN) IVPB 3.375 g     3.375 g 100 mL/hr over 30 Minutes Intravenous  Once 09/14/15 1620 09/14/15 1707   09/14/15 1630  vancomycin (VANCOCIN) IVPB 1000 mg/200 mL premix  Status:  Discontinued     1,000 mg 200 mL/hr over 60 Minutes Intravenous  Once 09/14/15 1620 09/14/15 1646       Family Communication: no family was present at bedside, at the time of interview.   Disposition:  Barriers to safe discharge:  Improvement in   Intake/Output Summary (Last 24 hours) at 09/19/15 1352 Last data filed at 09/19/15 1207  Gross per 24 hour  Intake 2762.72 ml  Output   4951 ml  Net -2188.28 ml   Filed Weights   09/18/15 0447 09/18/15 1500 09/19/15 0500  Weight: 148.326 kg (327 lb) 148.8 kg (328 lb 0.7 oz) 150.7 kg (332 lb 3.7 oz)    Objective: Physical Exam: Filed Vitals:   09/19/15 0500 09/19/15 0510 09/19/15 0808 09/19/15 1141  BP:  112/59 115/89 108/82  Pulse:   133 133  Temp:   97.2 F (36.2 C) 98.7 F (37.1 C)  TempSrc:   Oral Oral  Resp:  15 15 19   Height:      Weight: 150.7 kg (332 lb 3.7 oz)     SpO2:   95% 98%     General: Appear in moderate distress, bilateral maculopapular Rash; Oral Mucosa moist. Cardiovascular: S1 and S2 Present, no Murmur, positive JVD Respiratory: Bilateral Air entry present and faint basal  Crackles, no wheezes Abdomen: Bowel Sound present, Soft and no tenderness Extremities: Bilateral  Pedal edema, no calf tenderness Neurology: Grossly no focal neuro deficit.  Data Reviewed: CBC:  Recent Labs Lab 09/14/15 1610  09/15/15 0243 09/16/15 0030 09/17/15 0410 09/18/15 0400 09/19/15 0520  WBC 20.0*  < > 17.8* 12.2* 14.4* 15.2* 17.3*  NEUTROABS 17.0*  --   --   --   --   --   --   HGB 13.9  < > 12.5* 11.8* 11.2* 12.2* 11.7*  HCT 43.0  < > 39.1 35.8* 34.9* 37.5* 38.0*  MCV 81.0  < > 82.3 81.7 80.4 83.3 83.5  PLT PLATELET CLUMPS NOTED ON SMEAR, UNABLE TO ESTIMATE  < > 141* 120* 133* 145* 156  < > = values in this interval not displayed. Basic Metabolic Panel:  Recent Labs Lab 09/14/15 2344 09/15/15 0243 09/16/15 0030 09/17/15 0410 09/17/15 1544 09/17/15 1545 09/18/15 0400 09/19/15 0520  NA 133* 133* 134* 141  --  140 140 139  K 6.6* 5.6* 3.3* 2.8*  --  2.4* 3.2* 4.3  CL 100* 100* 98* 99*  --  96* 96* 98*  CO2 13* 19* 25 32  --  30 34* 31  GLUCOSE 82 110* 165* 133*  --  228* 130* 136*  BUN 54* 50* 47* 33*  --  33* 25* 25*  CREATININE  1.35* 1.33* 1.34* 1.02  --  1.22 0.81 0.85  CALCIUM 8.7* 8.5* 7.5* 7.6*  --  7.9* 7.5* 7.6*  MG 2.1 1.8 1.6* 1.7 1.7  --   --   --   PHOS 5.3* 4.4 4.0  --   --   --   --   --    Liver Function Tests:  Recent Labs Lab 09/14/15 2344 09/15/15 0243 09/15/15 1059 09/16/15 0030 09/18/15 0400 09/19/15 0520  AST 78* 66*  --  62* 134* 143*  ALT 66* 60  --  54 97*  140*  ALKPHOS 79 71  --  67 73 95  BILITOT 3.1* 3.3* 4.3* 3.3* 2.6* 3.7*  PROT 6.2* 5.7*  --  5.2* 5.1* 5.7*  ALBUMIN 2.6* 2.3*  --  2.1* 2.0* 2.1*   No results for input(s): LIPASE, AMYLASE in the last 168 hours. No results for input(s): AMMONIA in the last 168 hours.  Cardiac Enzymes:  Recent Labs Lab 09/14/15 2344 09/15/15 1059  CKTOTAL  --  1116*  TROPONINI 0.05* 0.08*    BNP (last 3 results)  Recent Labs  09/14/15 1610  BNP 1312.6*    CBG:  Recent Labs Lab 09/14/15 1547 09/14/15 2205 09/15/15 1141  GLUCAP 139* 84 91    Recent Results (from the past 240 hour(s))  Culture, blood (routine x 2)     Status: None   Collection Time: 09/14/15  3:48 PM  Result Value Ref Range Status   Specimen Description BLOOD LEFT FOREARM  Final   Special Requests BOTTLES DRAWN AEROBIC AND ANAEROBIC 5CC EACH  Final   Culture   Final    NO GROWTH 5 DAYS Performed at [facility]    Report Status 09/19/2015 FINAL  Final  Culture, blood (routine x 2)     Status: None   Collection Time: 09/14/15  4:30 PM  Result Value Ref Range Status   Specimen Description BLOOD LEFT ANTECUBITAL  Final   Special Requests BOTTLES DRAWN AEROBIC AND ANAEROBIC 6 CC EACH  Final   Culture   Final    NO GROWTH 5 DAYS Performed at [facility]    Report Status 09/19/2015 FINAL  Final  MRSA PCR Screening     Status: None   Collection Time: 09/14/15  9:45 PM  Result Value Ref Range Status   MRSA by PCR NEGATIVE NEGATIVE Final    Comment:        The GeneXpert MRSA Assay (FDA approved for NASAL specimens only), is one  component of a comprehensive MRSA colonization surveillance program. It is not intended to diagnose MRSA infection nor to guide or monitor treatment for MRSA infections.   Respiratory virus panel     Status: None   Collection Time: 09/14/15 11:05 PM  Result Value Ref Range Status   Source - RVPAN NASOPHARYNGEAL  Corrected   Respiratory Syncytial Virus A Comment Negative Final    Comment: (NOTE) We are UNABLE to reliably determine a result for the specimen due to the presence of PCR inhibitor(s) in the specimen submitted.  If clinically indicated, please recollect an additional specimen for testing.    Respiratory Syncytial Virus B Comment Negative Final    Comment: (NOTE) We are UNABLE to reliably determine a result for the specimen due to the presence of PCR inhibitor(s) in the specimen submitted.  If clinically indicated, please recollect an additional specimen for testing.    Influenza A Comment Negative Final    Comment: (NOTE) We are UNABLE to reliably determine a result for the specimen due to the presence of PCR inhibitor(s) in the specimen submitted.  If clinically indicated, please recollect an additional specimen for testing.    Influenza B Comment Negative Final    Comment: (NOTE) We are UNABLE to reliably determine a result for the specimen due to the presence of PCR inhibitor(s) in the specimen submitted.  If clinically indicated, please recollect an additional specimen for testing.    Parainfluenza 1 Comment Negative Final    Comment: (NOTE) We are UNABLE to reliably determine a result  for the specimen due to the presence of PCR inhibitor(s) in the specimen submitted.  If clinically indicated, please recollect an additional specimen for testing.    Parainfluenza 2 Comment Negative Final    Comment: (NOTE) We are UNABLE to reliably determine a result for the specimen due to the presence of PCR inhibitor(s) in the specimen submitted.  If clinically  indicated, please recollect an additional specimen for testing.    Parainfluenza 3 Comment Negative Final    Comment: (NOTE) We are UNABLE to reliably determine a result for the specimen due to the presence of PCR inhibitor(s) in the specimen submitted.  If clinically indicated, please recollect an additional specimen for testing.    Metapneumovirus Comment Negative Final    Comment: (NOTE) We are UNABLE to reliably determine a result for the specimen due to the presence of PCR inhibitor(s) in the specimen submitted.  If clinically indicated, please recollect an additional specimen for testing.    Rhinovirus Comment Negative Final    Comment: (NOTE) We are UNABLE to reliably determine a result for the specimen due to the presence of PCR inhibitor(s) in the specimen submitted.  If clinically indicated, please recollect an additional specimen for testing.    Adenovirus Comment Negative Final    Comment: (NOTE) We are UNABLE to reliably determine a result for the specimen due to the presence of PCR inhibitor(s) in the specimen submitted.  If clinically indicated, please recollect an additional specimen for testing. Performed At: [facility] [place], [place] [id number] Lindon Romp MD DC:[id number]   Urine culture     Status: None   Collection Time: 09/14/15 11:32 PM  Result Value Ref Range Status   Specimen Description URINE, CLEAN CATCH  Final   Special Requests NONE  Final   Culture NO GROWTH 1 DAY  Final   Report Status 09/16/2015 FINAL  Final     Studies: No results found.   Scheduled Meds: . antiseptic oral rinse  7 mL Mouth Rinse BID  . citalopram  10 mg Oral Daily  . digoxin  0.25 mg Oral Daily  . famotidine  20 mg Oral BID  . furosemide  80 mg Intravenous TID AC  . ivabradine  5 mg Oral BID WC  . lisinopril  2.5 mg Oral BID  . magnesium oxide  400 mg Oral Daily  . piperacillin-tazobactam (ZOSYN)  IV  3.375 g Intravenous Q8H  .  polyethylene glycol  17 g Oral Daily  . sodium chloride flush  10-40 mL Intracatheter Q12H  . spironolactone  25 mg Oral Daily  . vancomycin  1,500 mg Intravenous Q12H   Continuous Infusions: . sodium chloride Stopped (09/16/15 0942)  . heparin 2,400 Units/hr (09/19/15 0453)  . milrinone 0.375 mcg/kg/min (09/19/15 1157)   PRN Meds: sodium chloride, acetaminophen, ALPRAZolam, ondansetron (ZOFRAN) IV, sodium chloride flush, zolpidem  Time spent: 30 minutes  Author: Berle Mull, MD [facility] Pager: [phone] 09/19/2015 1:52 PM  If 7PM-7AM, please contact night-coverage at [email or web address], password [facility]

## 2015-09-19 NOTE — Progress Notes (Signed)
Patient ID: James Bennett, male   DOB: 1995-12-01, 20 y.o.   MRN: [id number] Comfortable. Denies any pain in his feet. 2+ dorsalis pedis pulses bilaterally. Further demarcation on both feet. Some superficial partial-thickness blistering on the dorsum of his feet. Some full-thickness loss on his toes with mummification. Discussed with the patient and his mother present.

## 2015-09-19 NOTE — Progress Notes (Signed)
ANTICOAGULATION CONSULT NOTE  Pharmacy Consult for heparin Indication: possible emoblic event  No Known Allergies  Patient Measurements: Height: 6\' 2"  (188 cm) Weight: (!) 332 lb 3.7 oz (150.7 kg) IBW/kg (Calculated) : 82.2 Heparin Dosing Weight: 117 kg  Vital Signs: Temp: 98.7 F (37.1 C) (02/25 1141) Temp Source: Oral (02/25 1141) BP: 108/82 mmHg (02/25 1141) Pulse Rate: 133 (02/25 1141)  Labs:  Recent Labs  09/17/15 0410 09/17/15 1545  09/18/15 0400  09/18/15 1417 09/18/15 2030 09/19/15 0520  HGB 11.2*  --   --  12.2*  --   --   --  11.7*  HCT 34.9*  --   --  37.5*  --   --   --  38.0*  PLT 133*  --   --  145*  --   --   --  156  LABPROT 19.1*  --   --  17.6*  --   --   --  18.0*  INR 1.61*  --   --  1.44  --   --   --  1.48  HEPARINUNFRC  --   --   < >  --   < > 0.38 0.33 0.47  CREATININE 1.02 1.22  --  0.81  --   --   --  0.85  < > = values in this interval not displayed.  Estimated Creatinine Clearance: 214.9 mL/min (by C-G formula based on Cr of 0.85).   Medical History: Past Medical History  Diagnosis Date  . Bronchitis     Assessment: 20 yo m with no PMH admitted on 2/20 with bilateral LE swelling x 1 week and SOB x 2.5 months.  Found with an EF of 15% and newly diagnosed cardiomyopathy.  Pharmacy is consulted to dose heparin for a possible embolic event d/t cyanotic toes and feet.  Negative for DVT.  INR was elevated on admission at 1.96, down to 1.48 today.   HL therapeutic at 0.47, Hgb 11.7, Plt 156, no bleeding noted.  Goal of Therapy:  Heparin level 0.3-0.7 units/ml Monitor platelets by anticoagulation protocol: Yes   Plan:  - continue heparin at 2400 units/hr - Daily heparin level, INR, and CBC - F/u plans for long-term anticoagulation vs. stopping heparin  Arcola Jansky, PharmD Clinical Pharmacy Resident Pager: [phone]  09/19/2015 1:43 PM

## 2015-09-20 DIAGNOSIS — E876 Hypokalemia: Secondary | ICD-10-CM

## 2015-09-20 DIAGNOSIS — R600 Localized edema: Secondary | ICD-10-CM

## 2015-09-20 DIAGNOSIS — I420 Dilated cardiomyopathy: Secondary | ICD-10-CM

## 2015-09-20 DIAGNOSIS — A419 Sepsis, unspecified organism: Secondary | ICD-10-CM | POA: Diagnosis present

## 2015-09-20 LAB — COMPREHENSIVE METABOLIC PANEL
ALT: 93 U/L — ABNORMAL HIGH (ref 17–63)
ANION GAP: 9 (ref 5–15)
AST: 59 U/L — ABNORMAL HIGH (ref 15–41)
Albumin: 1.9 g/dL — ABNORMAL LOW (ref 3.5–5.0)
Alkaline Phosphatase: 74 U/L (ref 38–126)
BILIRUBIN TOTAL: 3.3 mg/dL — AB (ref 0.3–1.2)
BUN: 21 mg/dL — ABNORMAL HIGH (ref 6–20)
CO2: 32 mmol/L (ref 22–32)
Calcium: 7.4 mg/dL — ABNORMAL LOW (ref 8.9–10.3)
Chloride: 95 mmol/L — ABNORMAL LOW (ref 101–111)
Creatinine, Ser: 0.89 mg/dL (ref 0.61–1.24)
GFR calc Af Amer: >60 mL/min (ref >60)
Glucose, Bld: 93 mg/dL (ref 65–99)
POTASSIUM: 3.6 mmol/L (ref 3.5–5.1)
Sodium: 136 mmol/L (ref 135–145)
TOTAL PROTEIN: 5.1 g/dL — AB (ref 6.5–8.1)

## 2015-09-20 LAB — BASIC METABOLIC PANEL
Anion gap: 11 (ref 5–15)
BUN: 19 mg/dL (ref 6–20)
CHLORIDE: 93 mmol/L — AB (ref 101–111)
CO2: 31 mmol/L (ref 22–32)
Calcium: 7.9 mg/dL — ABNORMAL LOW (ref 8.9–10.3)
Creatinine, Ser: 1.01 mg/dL (ref 0.61–1.24)
GFR calc Af Amer: >60 mL/min (ref >60)
GFR calc non Af Amer: >60 mL/min (ref >60)
Glucose, Bld: 98 mg/dL (ref 65–99)
POTASSIUM: 3.9 mmol/L (ref 3.5–5.1)
SODIUM: 135 mmol/L (ref 135–145)

## 2015-09-20 LAB — PROTIME-INR
INR: 1.28 (ref 0.00–1.49)
PROTHROMBIN TIME: 16.1 s — AB (ref 11.6–15.2)

## 2015-09-20 LAB — CARBOXYHEMOGLOBIN
CARBOXYHEMOGLOBIN: 2.4 % — AB (ref 0.5–1.5)
CARBOXYHEMOGLOBIN: 2.1 % — AB (ref 0.5–1.5)
Methemoglobin: 0.8 % (ref 0.0–1.5)
Methemoglobin: 0.8 % (ref 0.0–1.5)
O2 SAT: 57.7 %
O2 SAT: 68.2 %
Total hemoglobin: 11.3 g/dL — ABNORMAL LOW (ref 13.5–18.0)
Total hemoglobin: 12.7 g/dL — ABNORMAL LOW (ref 13.5–18.0)

## 2015-09-20 LAB — CMV ANTIBODY, IGG (EIA)

## 2015-09-20 LAB — CBC WITH DIFFERENTIAL/PLATELET
BASOS ABS: 0 10*3/uL (ref 0.0–0.1)
Basophils Relative: 0 %
Eosinophils Absolute: 0.2 10*3/uL (ref 0.0–0.7)
Eosinophils Relative: 1 %
HEMATOCRIT: 33.4 % — AB (ref 39.0–52.0)
Hemoglobin: 10.8 g/dL — ABNORMAL LOW (ref 13.0–17.0)
LYMPHS PCT: 10 %
Lymphs Abs: 1.9 10*3/uL (ref 0.7–4.0)
MCH: 26.5 pg (ref 26.0–34.0)
MCHC: 32.3 g/dL (ref 30.0–36.0)
MCV: 82.1 fL (ref 78.0–100.0)
Monocytes Absolute: 1.4 10*3/uL — ABNORMAL HIGH (ref 0.1–1.0)
Monocytes Relative: 7 %
NEUTROS ABS: 15 10*3/uL — AB (ref 1.7–7.7)
NEUTROS PCT: 82 %
PLATELETS: 163 10*3/uL (ref 150–400)
RBC: 4.07 MIL/uL — AB (ref 4.22–5.81)
RDW: 19.8 % — AB (ref 11.5–15.5)
WBC: 18.5 10*3/uL — AB (ref 4.0–10.5)

## 2015-09-20 LAB — MAGNESIUM: MAGNESIUM: 1.3 mg/dL — AB (ref 1.7–2.4)

## 2015-09-20 LAB — TYPE AND SCREEN
ABO/RH(D): A POS
Antibody Screen: NEGATIVE

## 2015-09-20 LAB — HEPARIN LEVEL (UNFRACTIONATED): HEPARIN UNFRACTIONATED: 0.35 [IU]/mL (ref 0.30–0.70)

## 2015-09-20 LAB — CMV IGM: CMV IgM: <30 AU/mL (ref 0.0–29.9)

## 2015-09-20 LAB — ABO/RH: ABO/RH(D): A POS

## 2015-09-20 MED ORDER — POTASSIUM CHLORIDE ER 10 MEQ PO TBCR
40.0000 meq | EXTENDED_RELEASE_TABLET | Freq: Once | ORAL | Status: AC
  Administered 2015-09-20: 40 meq via ORAL
  Filled 2015-09-20 (×2): qty 4

## 2015-09-20 MED ORDER — MAGNESIUM SULFATE 4 GM/100ML IV SOLN
4.0000 g | Freq: Once | INTRAVENOUS | Status: AC
  Administered 2015-09-20: 4 g via INTRAVENOUS
  Filled 2015-09-20: qty 100

## 2015-09-20 NOTE — Progress Notes (Signed)
ANTICOAGULATION CONSULT NOTE  Pharmacy Consult for heparin Indication: possible emoblic event  No Known Allergies  Patient Measurements: Height: 6\' 2"  (188 cm) Weight: (!) 307 lb 1.6 oz (139.3 kg) IBW/kg (Calculated) : 82.2 Heparin Dosing Weight: 117 kg  Vital Signs: Temp: 98.7 F (37.1 C) (02/26 0400) Temp Source: Oral (02/26 0400) BP: 104/65 mmHg (02/26 0530) Pulse Rate: 115 (02/25 2300)  Labs:  Recent Labs  09/18/15 0400  09/18/15 2030 09/19/15 0520 09/20/15 0530  HGB 12.2*  --   --  11.7* 10.8*  HCT 37.5*  --   --  38.0* 33.4*  PLT 145*  --   --  156 163  LABPROT 17.6*  --   --  18.0* 16.1*  INR 1.44  --   --  1.48 1.28  HEPARINUNFRC  --   < > 0.33 0.47 0.35  CREATININE 0.81  --   --  0.85 0.89  < > = values in this interval not displayed.  Estimated Creatinine Clearance: 196.6 mL/min (by C-G formula based on Cr of 0.89).   Medical History: Past Medical History  Diagnosis Date  . Bronchitis     Assessment: 20 yo m with no PMH admitted on 2/20 with bilateral LE swelling x 1 week and SOB x 2.5 months.  Found with an EF of 15% and newly diagnosed cardiomyopathy.  Pharmacy is consulted to dose heparin for a possible embolic event d/t cyanotic toes and feet.  Negative for DVT.  INR was elevated on admission at 1.96, down to 1.28 today.   HL therapeutic at 0.35, Hgb 10.8, Plt 163, no bleeding noted.  Goal of Therapy:  Heparin level 0.3-0.7 units/ml Monitor platelets by anticoagulation protocol: Yes   Plan:  - continue heparin at 2400 units/hr - Daily heparin level, INR, and CBC - F/u plans for long-term anticoagulation vs. stopping heparin  [place] Jansky, PharmD Clinical Pharmacy Resident Pager: [phone]  09/20/2015 7:57 AM

## 2015-09-20 NOTE — Progress Notes (Signed)
[facility] TEAM 1 - Stepdown/ICU TEAM Progress Note  Ariston Grandison VXY:[id number] DOB: 08/14/1995 DOA: 09/14/2015 PCP: No primary care provider on file.  Admit HPI / Brief Narrative: 20 y.o. male PMHx None. UDS Positive Opiates  Presented to [facility] with complaints of gradual onset, constant, worsening, bilateral lower leg swelling x 1 week. Pt reports that he was having shortness of breath and a dry cough since New Years (approximately 2.5 months ago). He was seen by his PCP 3 weeks ago and diagnosed with acute bronchitis. Pt was placed on Azithromycin and Prednisone without much relief. He was seen again last week and placed on Levaquin. Pt was seen again a couple of days ago and placed on Amoxicillin and a second round of Prednisone. He reports that his shortness of breath has since improved but he is still having a dry cough. Pt mentions that he has been lying around in his recliner for the past 2 weeks due to his bronchitis. He was unconcerned about his lower leg swelling until he noticed this morning that his toes were slightly blackened/purple in color. States that the color changes have been occuring over the last 3 days. Associated coolness to extremities appreciated. Pt states that he has been urinating normally but mentions an orange color to the urine since being on antibiotics.   Of note, patient has 6 dogs at home and one parrot he is exposed to at his dad's job. No recent travel. Mother has h/o Lupus and RA.   Denies fever, chills, diarrhea, abdominal pain, nausea, vomiting, chest pain, or any other associated symptoms.   HPI/Subjective: 2/26 A/O 4, NAD. Patient states symptoms came on or couple weeks and the last few days prior to admission toes began to blackened.  Assessment/Plan: Acute systolic CHF/ Dilated Cardiomyopathy  -LVEF =15%. -Current management per CHF team -Spoke with Dr.Daniel R Bensimhon CHF team who stated patient could be transferred to his service -At  present suspect viral myocarditis. - Digoxin 0.25 mg daily -Lasix 80 mg TID -Ivabradine 5 mg BID -Lisinopril 25 mg BID -Milrinone drip -Spironolactone 25 mg daily -Strict in and out -Daily weight  Pulmonary HTN -See CHF  PVD/Gangrenous toes?  -Bilateral toes various stages of early gangrene?  -Most likely secondary to cardio embolic  -Several toes that may require amputation once patient cardiac status stable  -Vascular surgery is following   Sepsis. -Patient met sepsis criteria on admission. -Continue current antibiotics  -Cultures so far are negative. We will get viral antigens. Respiratory PCR is also ordered.  Elevated LFT. -Abdominal ultrasound pending  -LFTs trending down still elevated Likely congestive hepatopathy   Lower Extremity Rash. -vasculitis workup so far has been negative. -Will get further workup For infectious etiology; to date negative  Hypokalemia -Potassium goal> 4 - K-Dur 39mq   Hypomagnesium -Magnesium goal>2 -Magnesium IV 4gm   Code Status: FULL Family Communication: family present at time of exam Disposition Plan: Per cardiology    Consultants: Dr.Daniel R Bensimhon CHF team Dr.Todd F Early, vascular surgery [facility]Elsworth [facility]   Procedure/Significant Events: 2/20 - venous doppler UKoreawithout signs of DVT 2/20 - CXR with multilobar PNA 2/21 Echocardigram; - Left ventricle: severely dilated. LVEF= 15%. Severe diffuse hypokinesis. - Mitral valve: mild regurgitation.- Left atrium: moderately dilated.- Right ventricle: mildly dilated.  - Tricuspid valve: moderate regurgitation. - Pulmonary arteries: PA peak pressure: 48 mm Hg (S). 2/23 Rt Heart Cath; -Relatively preserved cardiac output now off milrinone.-Elevated Right/Lleft heart filling pressures. Not sure  PCWP is completely accurate due to respiratory variation, but is certainly elevated   Culture 2/20 blood left forearm/ AC negative final  2/20 MRSA by PCR negative 2/20  urine negative final 2/20 HIV negative 2/21 acute hepatitis panel negative 2/24 respiratory virus panel pending   Antibiotics: Vanc 2/20 >> Zoysn 2/20 >>  DVT prophylaxis: Heparin drip   Devices    LINES / TUBES:      Continuous Infusions: . sodium chloride 10 mL/hr at 09/20/15 0013  . heparin 2,400 Units/hr (09/20/15 1115)  . milrinone 0.375 mcg/kg/min (09/20/15 1115)    Objective: VITAL SIGNS: Temp: 99.4 F (37.4 C) (02/26 1154) Temp Source: Oral (02/26 1154) BP: 98/50 mmHg (02/26 1154) Pulse Rate: 118 (02/26 1154) SPO2; FIO2:   Intake/Output Summary (Last 24 hours) at 09/20/15 1415 Last data filed at 09/20/15 1300  Gross per 24 hour  Intake   3303 ml  Output  10450 ml  Net  -7147 ml     Exam: General: A/O 4, NAD, No acute respiratory distress Eyes: Negative headache, negative scleral hemorrhage ENT: Negative Runny nose, negative gingival bleeding, Neck:  Negative scars, masses, torticollis, lymphadenopathy, JVD Lungs: Clear to auscultation bilaterally without wheezes or crackles Cardiovascular: Tachycardic, Regular Rhythm, negative murmur, positive gallop, normal S1 and S2 Abdomen:negative abdominal pain, nondistended, positive soft, bowel sounds, no rebound, no ascites, no appreciable mass Extremities: bilateral pitting edema 3+ to hips. Bilateral blisters on dorsal surface both feet, bilateral toes cyanotic/blackish (early gangrene?), Right heel positive cyanosis. Left foot probable early gangrene on second and third metatarsal (no feeling). Right metatarsal 3, 4 negative feeling to palpation.  Psychiatric:  Negative depression, negative anxiety, negative fatigue, negative mania  Neurologic:  Cranial nerves II through XII intact, tongue/uvula midline, all extremities muscle strength 5/5, sensation intact throughout,negative dysarthria, negative expressive aphasia, negative receptive aphasia.   Data Reviewed: Basic Metabolic Panel:  Recent  Labs Lab 09/14/15 2344 09/15/15 0243 09/16/15 0030 09/17/15 0410 09/17/15 1544 09/17/15 1545 09/18/15 0400 09/19/15 0520 09/20/15 0530  NA 133* 133* 134* 141  --  140 140 139 136  K 6.6* 5.6* 3.3* 2.8*  --  2.4* 3.2* 4.3 3.6  CL 100* 100* 98* 99*  --  96* 96* 98* 95*  CO2 13* 19* 25 32  --  30 34* 31 32  GLUCOSE 82 110* 165* 133*  --  228* 130* 136* 93  BUN 54* 50* 47* 33*  --  33* 25* 25* 21*  CREATININE 1.35* 1.33* 1.34* 1.02  --  1.22 0.81 0.85 0.89  CALCIUM 8.7* 8.5* 7.5* 7.6*  --  7.9* 7.5* 7.6* 7.4*  MG 2.1 1.8 1.6* 1.7 1.7  --   --   --  1.3*  PHOS 5.3* 4.4 4.0  --   --   --   --   --   --    Liver Function Tests:  Recent Labs Lab 09/15/15 0243 09/15/15 1059 09/16/15 0030 09/18/15 0400 09/19/15 0520 09/20/15 0530  AST 66*  --  62* 134* 143* 59*  ALT 60  --  54 97* 140* 93*  ALKPHOS 71  --  67 73 95 74  BILITOT 3.3* 4.3* 3.3* 2.6* 3.7* 3.3*  PROT 5.7*  --  5.2* 5.1* 5.7* 5.1*  ALBUMIN 2.3*  --  2.1* 2.0* 2.1* 1.9*   No results for input(s): LIPASE, AMYLASE in the last 168 hours. No results for input(s): AMMONIA in the last 168 hours. CBC:  Recent Labs Lab 09/14/15 1610  09/16/15  0030 09/17/15 0410 09/18/15 0400 09/19/15 0520 09/20/15 0530  WBC 20.0*  < > 12.2* 14.4* 15.2* 17.3* 18.5*  NEUTROABS 17.0*  --   --   --   --   --  15.0*  HGB 13.9  < > 11.8* 11.2* 12.2* 11.7* 10.8*  HCT 43.0  < > 35.8* 34.9* 37.5* 38.0* 33.4*  MCV 81.0  < > 81.7 80.4 83.3 83.5 82.1  PLT PLATELET CLUMPS NOTED ON SMEAR, UNABLE TO ESTIMATE  < > 120* 133* 145* 156 163  < > = values in this interval not displayed. Cardiac Enzymes:  Recent Labs Lab 09/14/15 2344 09/15/15 1059  CKTOTAL  --  1116*  TROPONINI 0.05* 0.08*   BNP (last 3 results)  Recent Labs  09/14/15 1610  BNP 1312.6*    ProBNP (last 3 results) No results for input(s): PROBNP in the last 8760 hours.  CBG:  Recent Labs Lab 09/14/15 1547 09/14/15 2205 09/15/15 1141  GLUCAP 139* 84 91     Recent Results (from the past 240 hour(s))  Culture, blood (routine x 2)     Status: None   Collection Time: 09/14/15  3:48 PM  Result Value Ref Range Status   Specimen Description BLOOD LEFT FOREARM  Final   Special Requests BOTTLES DRAWN AEROBIC AND ANAEROBIC 5CC EACH  Final   Culture   Final    NO GROWTH 5 DAYS Performed at [facility]    Report Status 09/19/2015 FINAL  Final  Culture, blood (routine x 2)     Status: None   Collection Time: 09/14/15  4:30 PM  Result Value Ref Range Status   Specimen Description BLOOD LEFT ANTECUBITAL  Final   Special Requests BOTTLES DRAWN AEROBIC AND ANAEROBIC 6 CC EACH  Final   Culture   Final    NO GROWTH 5 DAYS Performed at [facility]    Report Status 09/19/2015 FINAL  Final  MRSA PCR Screening     Status: None   Collection Time: 09/14/15  9:45 PM  Result Value Ref Range Status   MRSA by PCR NEGATIVE NEGATIVE Final    Comment:        The GeneXpert MRSA Assay (FDA approved for NASAL specimens only), is one component of a comprehensive MRSA colonization surveillance program. It is not intended to diagnose MRSA infection nor to guide or monitor treatment for MRSA infections.   Respiratory virus panel     Status: None   Collection Time: 09/14/15 11:05 PM  Result Value Ref Range Status   Source - RVPAN NASOPHARYNGEAL  Corrected   Respiratory Syncytial Virus A Comment Negative Final    Comment: (NOTE) We are UNABLE to reliably determine a result for the specimen due to the presence of PCR inhibitor(s) in the specimen submitted.  If clinically indicated, please recollect an additional specimen for testing.    Respiratory Syncytial Virus B Comment Negative Final    Comment: (NOTE) We are UNABLE to reliably determine a result for the specimen due to the presence of PCR inhibitor(s) in the specimen submitted.  If clinically indicated, please recollect an additional specimen for testing.    Influenza A Comment  Negative Final    Comment: (NOTE) We are UNABLE to reliably determine a result for the specimen due to the presence of PCR inhibitor(s) in the specimen submitted.  If clinically indicated, please recollect an additional specimen for testing.    Influenza B Comment Negative Final    Comment: (NOTE) We are  UNABLE to reliably determine a result for the specimen due to the presence of PCR inhibitor(s) in the specimen submitted.  If clinically indicated, please recollect an additional specimen for testing.    Parainfluenza 1 Comment Negative Final    Comment: (NOTE) We are UNABLE to reliably determine a result for the specimen due to the presence of PCR inhibitor(s) in the specimen submitted.  If clinically indicated, please recollect an additional specimen for testing.    Parainfluenza 2 Comment Negative Final    Comment: (NOTE) We are UNABLE to reliably determine a result for the specimen due to the presence of PCR inhibitor(s) in the specimen submitted.  If clinically indicated, please recollect an additional specimen for testing.    Parainfluenza 3 Comment Negative Final    Comment: (NOTE) We are UNABLE to reliably determine a result for the specimen due to the presence of PCR inhibitor(s) in the specimen submitted.  If clinically indicated, please recollect an additional specimen for testing.    Metapneumovirus Comment Negative Final    Comment: (NOTE) We are UNABLE to reliably determine a result for the specimen due to the presence of PCR inhibitor(s) in the specimen submitted.  If clinically indicated, please recollect an additional specimen for testing.    Rhinovirus Comment Negative Final    Comment: (NOTE) We are UNABLE to reliably determine a result for the specimen due to the presence of PCR inhibitor(s) in the specimen submitted.  If clinically indicated, please recollect an additional specimen for testing.    Adenovirus Comment Negative Final    Comment:  (NOTE) We are UNABLE to reliably determine a result for the specimen due to the presence of PCR inhibitor(s) in the specimen submitted.  If clinically indicated, please recollect an additional specimen for testing. Performed At: [facility] [place], [place] [id number] Lindon Romp MD ZT:[id number]   Urine culture     Status: None   Collection Time: 09/14/15 11:32 PM  Result Value Ref Range Status   Specimen Description URINE, CLEAN CATCH  Final   Special Requests NONE  Final   Culture NO GROWTH 1 DAY  Final   Report Status 09/16/2015 FINAL  Final     Studies:  Recent x-ray studies have been reviewed in detail by the Attending Physician  Scheduled Meds:  Scheduled Meds: . antiseptic oral rinse  7 mL Mouth Rinse BID  . citalopram  10 mg Oral Daily  . digoxin  0.25 mg Oral Daily  . famotidine  20 mg Oral BID  . furosemide  80 mg Intravenous TID AC  . ivabradine  5 mg Oral BID WC  . lisinopril  2.5 mg Oral BID  . magnesium oxide  400 mg Oral Daily  . piperacillin-tazobactam (ZOSYN)  IV  3.375 g Intravenous Q8H  . polyethylene glycol  17 g Oral Daily  . sodium chloride flush  10-40 mL Intracatheter Q12H  . spironolactone  25 mg Oral Daily  . vancomycin  1,500 mg Intravenous Q12H    Time spent on care of this patient: 40 mins   WOODS, Geraldo Docker , MD  [facility] Office  [phone] Pager [phone]  On-Call/Text Page:      [email or web address]      password [email or web address]  If 7PM-7AM, please contact night-coverage [email or web address] Password [email or web address] 09/20/2015, 2:15 PM   LOS: 6 days   Care during the described time interval was provided by me .  I have reviewed this patient's available data, including medical  history, events of note, physical examination, and all test results as part of my evaluation. I have personally reviewed and interpreted all radiology studies.   Dia Crawford, MD [phone] Pager

## 2015-09-20 NOTE — Progress Notes (Addendum)
Patient ID: James Bennett, male   DOB: 1995/12/01, 20 y.o.   MRN: [id number]    Advanced Heart Failure Rounding Note   Subjective:    Milrinone restarted on 2/25 due to low co-ox (30-40s). Co-ox now up to 58% on 0.375. Diuresing extremely well. Over 8L out yesterday. Weight dow 25 pounds (?). CVP 12. Breathing much better. Able to sleep through the night.  Discussed possibility of mechanical support again. Understandably, very anxious and stressed about his condition. Started on Celexa and prn Xanax.  Mg 1.3 this am. Got 4g IV.   ECHO 09/15/2015 EF 15% IVC dilated RV mildly decreased.   [facility] Procedural Findings (2/23): Hemodynamics (mmHg) RA mean 19 RV 54/19 PA 51/32, mean 45 PCWP mean 40 Oxygen saturations: PA 57% AO 96% Cardiac Output (Fick) 6.02  Cardiac Index (Fick) 2.24 Cardiac Output (Thermo) 6.88 Cardiac Index (Thermo) 2.56  Objective:   Weight Range:  Vital Signs:   Temp:  [98.4 F (36.9 C)-99.2 F (37.3 C)] 99.2 F (37.3 C) (02/26 0810) Pulse Rate:  [25-133] 121 (02/26 0947) Resp:  [15-20] 19 (02/26 0947) BP: (99-116)/(42-82) 111/54 mmHg (02/26 0947) SpO2:  [97 %-98 %] 97 % (02/26 0810) Weight:  [139.3 kg (307 lb 1.6 oz)] 139.3 kg (307 lb 1.6 oz) (02/26 0625) Last BM Date: 09/20/15  Weight change: Filed Weights   09/18/15 1500 09/19/15 0500 09/20/15 0625  Weight: 148.8 kg (328 lb 0.7 oz) 150.7 kg (332 lb 3.7 oz) 139.3 kg (307 lb 1.6 oz)    Intake/Output:   Intake/Output Summary (Last 24 hours) at 09/20/15 1025 Last data filed at 09/20/15 1000  Gross per 24 hour  Intake 3312.89 ml  Output   8975 ml  Net -5662.11 ml     Physical Exam: General:  Lying in bed. NAD HEENT: normal. LIJ  Neck: Thick. JVP12. Carotids 2+ bilat; no bruits. No lymphadenopathy or thryomegaly appreciated. Cor: PMI laterally displaced.tachy, regular rate & rhythm. +S3.  No murmur.  Lungs: clear anteriorly Abdomen: obese, soft, nontender, nondistended. No hepatosplenomegaly. No  bruits or masses. Good bowel sounds. Extremities: no cyanosis, clubbing, rash. 2+ edema. R and L toes purple with ongoing slow improvement.  Neuro: alert & orientedx3, cranial nerves grossly intact. moves all 4 extremities w/o difficulty. Affect pleasant  Telemetry: Sinus Tach 120s (down from 140s)  Labs: Basic Metabolic Panel:  Recent Labs Lab 09/14/15 2344 09/15/15 0243 09/16/15 0030 09/17/15 0410 09/17/15 1544 09/17/15 1545 09/18/15 0400 09/19/15 0520 09/20/15 0530  NA 133* 133* 134* 141  --  140 140 139 136  K 6.6* 5.6* 3.3* 2.8*  --  2.4* 3.2* 4.3 3.6  CL 100* 100* 98* 99*  --  96* 96* 98* 95*  CO2 13* 19* 25 32  --  30 34* 31 32  GLUCOSE 82 110* 165* 133*  --  228* 130* 136* 93  BUN 54* 50* 47* 33*  --  33* 25* 25* 21*  CREATININE 1.35* 1.33* 1.34* 1.02  --  1.22 0.81 0.85 0.89  CALCIUM 8.7* 8.5* 7.5* 7.6*  --  7.9* 7.5* 7.6* 7.4*  MG 2.1 1.8 1.6* 1.7 1.7  --   --   --  1.3*  PHOS 5.3* 4.4 4.0  --   --   --   --   --   --     Liver Function Tests:  Recent Labs Lab 09/15/15 0243 09/15/15 1059 09/16/15 0030 09/18/15 0400 09/19/15 0520 09/20/15 0530  AST 66*  --  62* 134* 143*  59*  ALT 60  --  54 97* 140* 93*  ALKPHOS 71  --  67 73 95 74  BILITOT 3.3* 4.3* 3.3* 2.6* 3.7* 3.3*  PROT 5.7*  --  5.2* 5.1* 5.7* 5.1*  ALBUMIN 2.3*  --  2.1* 2.0* 2.1* 1.9*   No results for input(s): LIPASE, AMYLASE in the last 168 hours. No results for input(s): AMMONIA in the last 168 hours.  CBC:  Recent Labs Lab 09/14/15 1610  09/16/15 0030 09/17/15 0410 09/18/15 0400 09/19/15 0520 09/20/15 0530  WBC 20.0*  < > 12.2* 14.4* 15.2* 17.3* 18.5*  NEUTROABS 17.0*  --   --   --   --   --  15.0*  HGB 13.9  < > 11.8* 11.2* 12.2* 11.7* 10.8*  HCT 43.0  < > 35.8* 34.9* 37.5* 38.0* 33.4*  MCV 81.0  < > 81.7 80.4 83.3 83.5 82.1  PLT PLATELET CLUMPS NOTED ON SMEAR, UNABLE TO ESTIMATE  < > 120* 133* 145* 156 163  < > = values in this interval not displayed.  Cardiac  Enzymes:  Recent Labs Lab 09/14/15 2344 09/15/15 1059  CKTOTAL  --  1116*  TROPONINI 0.05* 0.08*    BNP: BNP (last 3 results)  Recent Labs  09/14/15 1610  BNP 1312.6*    ProBNP (last 3 results) No results for input(s): PROBNP in the last 8760 hours.    Other results:  Imaging: No results found.   Medications:     Scheduled Medications: . antiseptic oral rinse  7 mL Mouth Rinse BID  . citalopram  10 mg Oral Daily  . digoxin  0.25 mg Oral Daily  . famotidine  20 mg Oral BID  . furosemide  80 mg Intravenous TID AC  . ivabradine  5 mg Oral BID WC  . lisinopril  2.5 mg Oral BID  . magnesium oxide  400 mg Oral Daily  . piperacillin-tazobactam (ZOSYN)  IV  3.375 g Intravenous Q8H  . polyethylene glycol  17 g Oral Daily  . sodium chloride flush  10-40 mL Intracatheter Q12H  . spironolactone  25 mg Oral Daily  . vancomycin  1,500 mg Intravenous Q12H    Infusions: . sodium chloride 10 mL/hr at 09/20/15 0013  . heparin 2,400 Units/hr (09/20/15 0013)  . milrinone 0.375 mcg/kg/min (09/20/15 3295)    PRN Medications: sodium chloride, acetaminophen, ALPRAZolam, ondansetron (ZOFRAN) IV, sodium chloride flush, zolpidem   Assessment/Plan/ Discussion     1. Cardiogenic shock 2. Acute systolic CHF: EF 18% by echo with mildly dilated and mildly dysfunctional RV. Low output HF with marked volume overload initially.  Was on milrinone but able to titrate off.  [place] on 2/23 showed good cardiac output off milrinone but still very volume overloaded. I am concerned for viral myocarditis, has had viral-type symptoms for several weeks and has had antibiotic courses at home prior to admission. Also considering autoimmune source (so far, rheumatologic serologies are all normal).  May need to consider endomyocardial bx at some point.  - Milrinone restarted 2/25 for recurrent profound shock. Co-ox now 58% on 0.375. Will not try to rewean - Volume status much improved with increase  lasix to 80 IV TID - I again discussed possible need for advanced therapies. BMI currently 38 so over cutoff for transplant. At 6'3" would need to be 280 pounds to qualify. May get there with diuresis. Will check blood type. Also discussed possible VAD.  RV mildly reduced on echo. RA/PCWP on cath < 0.50. Will  discuss with VAD team in am. Suspect it may be worth them starting eval. - Continue digoxin 0.25.   - Continue lisinopril to 2.5 bid and spironolactone to 25 daily.  Replace K. - Need to be careful with ivabradine in setting of acute shock.    - Would like to eventually get cardiac MRI but may be too large for MRI, may try to do this early next week.  3. Acute respiratory failure: Suspect pulmonary edema plays a role but cannot rule out autoimmune pneumonitis process => serologies all negative so far. He is on nasal cannula now with diuresis 4. Rash: Petechial rash on lower legs, platelets not significantly low. ?Vasculitis playing a role here but so far autoimmune workup negative.  5. Ischemic toes: Suspect due to low flow/low cardiac output => think unlikely to be cardioembolic with symmetry. No LV thrombus noted on echo. He has dopplerable and now palpable lower extremity pulses.  Vascular has evaluated, recommend short course of anticoagulation.  Now that INR < 2, he is on heparin gtt. Will need amputation at some point.   6. Elevated LFTs: Prominent elevated bilirubin. Suspect this is related to congestive hepatopathy. LFTs now down with hemodynamic support 7. ID: Covering with vancomycin/Zosyn, ? PNA component.  PCT was very elevated.  If cultures negative and remains afebrile, stop at 5 days.  8. Rheum: ?Autoimmune process like vasculitis. Elevated CRP, normal ESR. Autoimmune serologies all negative so far.Now off steroids 9. AKI: Evaluated by renal, do not think there is autoimmune process involving kidneys, probably cardiorenal situation.  BUN/creatinine now normal with  hemodynamic support. 10. Anxiety/depression - celexa and prn xanax started 2/25 11. Hypomagnesemia  --received 4g this am. Will give another 4g.   Discussed at length with patient and his parents.  Can transfer to HF service if [facility] would like.  The patient is critically ill with multiple organ systems failure and requires high complexity decision making for assessment and support, frequent evaluation and titration of therapies, application of advanced monitoring technologies and extensive interpretation of multiple databases.   Critical Care Time devoted to patient care services described in this note is 45 Minutes.    Glori Bickers MD 09/20/2015 10:25 AM  Addendum: Blood type A+  Venise Ellingwood,MD 10:16 PM

## 2015-09-21 ENCOUNTER — Inpatient Hospital Stay (HOSPITAL_COMMUNITY): Payer: BLUE CROSS/BLUE SHIELD

## 2015-09-21 DIAGNOSIS — R609 Edema, unspecified: Secondary | ICD-10-CM

## 2015-09-21 LAB — BASIC METABOLIC PANEL
ANION GAP: 11 (ref 5–15)
Anion gap: 9 (ref 5–15)
BUN: 17 mg/dL (ref 6–20)
BUN: 18 mg/dL (ref 6–20)
CALCIUM: 8.1 mg/dL — AB (ref 8.9–10.3)
CHLORIDE: 92 mmol/L — AB (ref 101–111)
CO2: 31 mmol/L (ref 22–32)
CO2: 29 mmol/L (ref 22–32)
Calcium: 7.9 mg/dL — ABNORMAL LOW (ref 8.9–10.3)
Chloride: 93 mmol/L — ABNORMAL LOW (ref 101–111)
Creatinine, Ser: 0.84 mg/dL (ref 0.61–1.24)
Creatinine, Ser: 0.96 mg/dL (ref 0.61–1.24)
GFR calc Af Amer: >60 mL/min (ref >60)
GFR calc non Af Amer: >60 mL/min (ref >60)
Glucose, Bld: 92 mg/dL (ref 65–99)
Glucose, Bld: 97 mg/dL (ref 65–99)
POTASSIUM: 3.6 mmol/L (ref 3.5–5.1)
Potassium: 3.7 mmol/L (ref 3.5–5.1)
SODIUM: 132 mmol/L — AB (ref 135–145)
Sodium: 133 mmol/L — ABNORMAL LOW (ref 135–145)

## 2015-09-21 LAB — APTT: APTT: 52 s — AB (ref 24–37)

## 2015-09-21 LAB — PROTIME-INR
INR: 1.19 (ref 0.00–1.49)
INR: 1.28 (ref 0.00–1.49)
Prothrombin Time: 15.3 seconds — ABNORMAL HIGH (ref 11.6–15.2)
Prothrombin Time: 16.1 seconds — ABNORMAL HIGH (ref 11.6–15.2)

## 2015-09-21 LAB — MAGNESIUM
MAGNESIUM: 1.6 mg/dL — AB (ref 1.7–2.4)
Magnesium: 1.5 mg/dL — ABNORMAL LOW (ref 1.7–2.4)

## 2015-09-21 LAB — CBC
HCT: 35.4 % — ABNORMAL LOW (ref 39.0–52.0)
Hemoglobin: 11.1 g/dL — ABNORMAL LOW (ref 13.0–17.0)
MCH: 25.5 pg — ABNORMAL LOW (ref 26.0–34.0)
MCHC: 31.4 g/dL (ref 30.0–36.0)
MCV: 81.2 fL (ref 78.0–100.0)
PLATELETS: 307 10*3/uL (ref 150–400)
RBC: 4.36 MIL/uL (ref 4.22–5.81)
RDW: 20.3 % — ABNORMAL HIGH (ref 11.5–15.5)
WBC: 19.6 10*3/uL — AB (ref 4.0–10.5)

## 2015-09-21 LAB — CARBOXYHEMOGLOBIN
CARBOXYHEMOGLOBIN: 2.4 % — AB (ref 0.5–1.5)
METHEMOGLOBIN: 0.7 % (ref 0.0–1.5)
O2 Saturation: 63.8 %
TOTAL HEMOGLOBIN: 10.8 g/dL — AB (ref 13.5–18.0)

## 2015-09-21 LAB — LIPID PANEL
CHOL/HDL RATIO: 9.4 ratio
CHOLESTEROL: 151 mg/dL (ref 0–200)
HDL: 16 mg/dL — ABNORMAL LOW (ref >40)
LDL CALC: 99 mg/dL (ref 0–99)
TRIGLYCERIDES: 178 mg/dL — AB (ref <150)
VLDL: 36 mg/dL (ref 0–40)

## 2015-09-21 LAB — URIC ACID: Uric Acid, Serum: 2.9 mg/dL — ABNORMAL LOW (ref 4.4–7.6)

## 2015-09-21 LAB — RESPIRATORY VIRUS PANEL
Adenovirus: NEGATIVE
Influenza A: NEGATIVE
Influenza B: NEGATIVE
Metapneumovirus: NEGATIVE
Parainfluenza 1: NEGATIVE
Parainfluenza 2: NEGATIVE
Parainfluenza 3: NEGATIVE
Respiratory Syncytial Virus A: NEGATIVE
Respiratory Syncytial Virus B: NEGATIVE
Rhinovirus: NEGATIVE

## 2015-09-21 LAB — LACTATE DEHYDROGENASE: LDH: 345 U/L — AB (ref 98–192)

## 2015-09-21 LAB — HIV ANTIBODY (ROUTINE TESTING W REFLEX): HIV SCREEN 4TH GENERATION: NONREACTIVE

## 2015-09-21 LAB — ANTITHROMBIN III: ANTITHROMB III FUNC: 77 % (ref 75–120)

## 2015-09-21 LAB — HEPARIN LEVEL (UNFRACTIONATED)
HEPARIN UNFRACTIONATED: 0.15 [IU]/mL — AB (ref 0.30–0.70)
Heparin Unfractionated: 0.17 IU/mL — ABNORMAL LOW (ref 0.30–0.70)

## 2015-09-21 LAB — PREALBUMIN: Prealbumin: 9.5 mg/dL — ABNORMAL LOW (ref 18–38)

## 2015-09-21 LAB — DIGOXIN LEVEL: Digoxin Level: 0.3 ng/mL — ABNORMAL LOW (ref 0.8–2.0)

## 2015-09-21 LAB — CRYOGLOBULIN

## 2015-09-21 MED ORDER — SODIUM CHLORIDE 0.9 % IV SOLN
0.1500 mg/kg/h | INTRAVENOUS | Status: DC
  Administered 2015-09-21 – 2015-09-29 (×12): 0.15 mg/kg/h via INTRAVENOUS
  Filled 2015-09-21 (×14): qty 250

## 2015-09-21 MED ORDER — HEPARIN BOLUS VIA INFUSION
3000.0000 [IU] | Freq: Once | INTRAVENOUS | Status: AC
  Administered 2015-09-21: 3000 [IU] via INTRAVENOUS
  Filled 2015-09-21: qty 3000

## 2015-09-21 MED ORDER — LISINOPRIL 5 MG PO TABS
5.0000 mg | ORAL_TABLET | Freq: Two times a day (BID) | ORAL | Status: DC
  Administered 2015-09-21 (×2): 5 mg via ORAL
  Filled 2015-09-21 (×2): qty 1

## 2015-09-21 MED ORDER — MAGNESIUM SULFATE 4 GM/100ML IV SOLN
4.0000 g | Freq: Once | INTRAVENOUS | Status: AC
Start: 2015-09-21 — End: 2015-09-21
  Administered 2015-09-21: 4 g via INTRAVENOUS
  Filled 2015-09-21: qty 100

## 2015-09-21 MED ORDER — POTASSIUM CHLORIDE CRYS ER 20 MEQ PO TBCR
40.0000 meq | EXTENDED_RELEASE_TABLET | Freq: Once | ORAL | Status: AC
  Administered 2015-09-21: 40 meq via ORAL
  Filled 2015-09-21: qty 2

## 2015-09-21 MED ORDER — MAGNESIUM SULFATE 2 GM/50ML IV SOLN
INTRAVENOUS | Status: AC
  Filled 2015-09-21: qty 100

## 2015-09-21 NOTE — Progress Notes (Signed)
Patient's RUE appears more swollen this AM when compared to L arm. Patient has no complaints of pain to site, radial pulse palpable. Tonye Becket, NP aware, orders for RUE venous ultrasound. Will continue to monitor.

## 2015-09-21 NOTE — Progress Notes (Signed)
Patient had 11 beat run of NSVT. Patient asymptomatic, BP stable 106/52. Tonye Becket, NP aware, orders given to check and mag and K+ now.

## 2015-09-21 NOTE — Progress Notes (Addendum)
James Bennett ID: James Bennett, male   DOB: May 23, 1996, 20 y.o.   MRN: [id number]    Advanced Heart Failure Rounding Note   Subjective:    Milrinone restarted on 2/25 due to low co-ox (30-40s). Co-ox now up to 63.8% on 0.375. Weight up? despite being 4.5 liters net negative. CVP 11.  HR remains in 110s-120s.  Denies SOB/Orthopnea.    ECHO 09/15/2015 EF 15% IVC dilated RV mildly decreased.   [facility] Procedural Findings (2/23): Hemodynamics (mmHg) RA mean 19 RV 54/19 PA 51/32, mean 45 PCWP mean 40 Oxygen saturations: PA 57% AO 96% Cardiac Output (Fick) 6.02  Cardiac Index (Fick) 2.24 Cardiac Output (Thermo) 6.88 Cardiac Index (Thermo) 2.56  Objective:   Weight Range:  Vital Signs:   Temp:  [98.7 F (37.1 C)-99.4 F (37.4 C)] 98.8 F (37.1 C) (02/27 0326) Pulse Rate:  [97-126] 113 (02/27 0326) Resp:  [19-28] 20 (02/27 0326) BP: (98-123)/(43-72) 123/72 mmHg (02/27 0326) SpO2:  [96 %-99 %] 96 % (02/27 0326) Weight:  [313 lb 0.9 oz (142 kg)] 313 lb 0.9 oz (142 kg) (02/27 0500) Last BM Date: 09/20/15  Weight change: Filed Weights   09/19/15 0500 09/20/15 0625 09/21/15 0500  Weight: 332 lb 3.7 oz (150.7 kg) 307 lb 1.6 oz (139.3 kg) 313 lb 0.9 oz (142 kg)    Intake/Output:   Intake/Output Summary (Last 24 hours) at 09/21/15 0757 Last data filed at 09/21/15 0700  Gross per 24 hour  Intake 3774.95 ml  Output   8400 ml  Net -4625.05 ml     Physical Exam: CVP 11 General:  Lying in bed. NAD. In bed.  HEENT: normal. LIJ  Neck: Thick. JVP12. Carotids 2+ bilat; no bruits. No lymphadenopathy or thryomegaly appreciated. Cor: PMI laterally displaced.  Tachy, regular rate & rhythm. +S3.  No murmur.  Lungs: clear anteriorly Abdomen: obese, soft, nontender, nondistended. No hepatosplenomegaly. No bruits or masses. Good bowel sounds. Extremities: no cyanosis, clubbing.  1+ ankle edema. R and L toes purple with ongoing slow improvement.  Neuro: alert & orientedx3, cranial nerves grossly  intact. moves all 4 extremities w/o difficulty. Affect pleasant  Telemetry: Sinus Tach 110s-120s   Labs: Basic Metabolic Panel:  Recent Labs Lab 09/14/15 2344 09/15/15 0243 09/16/15 0030 09/17/15 0410 09/17/15 1544  09/18/15 0400 09/19/15 0520 09/20/15 0530 09/20/15 2240 09/21/15 0530  NA 133* 133* 134* 141  --   < > 140 139 136 135 132*  K 6.6* 5.6* 3.3* 2.8*  --   < > 3.2* 4.3 3.6 3.9 3.6  CL 100* 100* 98* 99*  --   < > 96* 98* 95* 93* 92*  CO2 13* 19* 25 32  --   < > 34* 31 32 31 31  GLUCOSE 82 110* 165* 133*  --   < > 130* 136* 93 98 92  BUN 54* 50* 47* 33*  --   < > 25* 25* 21* 19 17  CREATININE 1.35* 1.33* 1.34* 1.02  --   < > 0.81 0.85 0.89 1.01 0.84  CALCIUM 8.7* 8.5* 7.5* 7.6*  --   < > 7.5* 7.6* 7.4* 7.9* 7.9*  MG 2.1 1.8 1.6* 1.7 1.7  --   --   --  1.3*  --   --   PHOS 5.3* 4.4 4.0  --   --   --   --   --   --   --   --   < > = values in this interval not displayed.  Liver Function Tests:  Recent Labs Lab 09/15/15 0243 09/15/15 1059 09/16/15 0030 09/18/15 0400 09/19/15 0520 09/20/15 0530  AST 66*  --  62* 134* 143* 59*  ALT 60  --  54 97* 140* 93*  ALKPHOS 71  --  67 73 95 74  BILITOT 3.3* 4.3* 3.3* 2.6* 3.7* 3.3*  PROT 5.7*  --  5.2* 5.1* 5.7* 5.1*  ALBUMIN 2.3*  --  2.1* 2.0* 2.1* 1.9*   No results for input(s): LIPASE, AMYLASE in the last 168 hours. No results for input(s): AMMONIA in the last 168 hours.  CBC:  Recent Labs Lab 09/14/15 1610  09/16/15 0030 09/17/15 0410 09/18/15 0400 09/19/15 0520 09/20/15 0530  WBC 20.0*  < > 12.2* 14.4* 15.2* 17.3* 18.5*  NEUTROABS 17.0*  --   --   --   --   --  15.0*  HGB 13.9  < > 11.8* 11.2* 12.2* 11.7* 10.8*  HCT 43.0  < > 35.8* 34.9* 37.5* 38.0* 33.4*  MCV 81.0  < > 81.7 80.4 83.3 83.5 82.1  PLT PLATELET CLUMPS NOTED ON SMEAR, UNABLE TO ESTIMATE  < > 120* 133* 145* 156 163  < > = values in this interval not displayed.  Cardiac Enzymes:  Recent Labs Lab 09/14/15 2344 09/15/15 1059  CKTOTAL   --  1116*  TROPONINI 0.05* 0.08*    BNP: BNP (last 3 results)  Recent Labs  09/14/15 1610  BNP 1312.6*    ProBNP (last 3 results) No results for input(s): PROBNP in the last 8760 hours.    Other results:  Imaging: No results found.   Medications:     Scheduled Medications: . antiseptic oral rinse  7 mL Mouth Rinse BID  . citalopram  10 mg Oral Daily  . digoxin  0.25 mg Oral Daily  . famotidine  20 mg Oral BID  . furosemide  80 mg Intravenous TID AC  . ivabradine  5 mg Oral BID WC  . lisinopril  2.5 mg Oral BID  . magnesium oxide  400 mg Oral Daily  . piperacillin-tazobactam (ZOSYN)  IV  3.375 g Intravenous Q8H  . polyethylene glycol  17 g Oral Daily  . sodium chloride flush  10-40 mL Intracatheter Q12H  . spironolactone  25 mg Oral Daily  . vancomycin  1,500 mg Intravenous Q12H    Infusions: . sodium chloride 10 mL/hr at 09/20/15 0013  . heparin 2,700 Units/hr (09/21/15 0651)  . milrinone 0.375 mcg/kg/min (09/21/15 0600)    PRN Medications: sodium chloride, acetaminophen, ALPRAZolam, ondansetron (ZOFRAN) IV, sodium chloride flush, zolpidem   Assessment/Plan/ Discussion     1. Cardiogenic shock 2. Acute systolic CHF: EF 67% by echo with mildly dilated and mildly dysfunctional RV. Low output HF with marked volume overload initially.  Was on milrinone but able to titrate off.  James Bennett on 2/23 showed good cardiac output off milrinone but still very volume overloaded. I am concerned for viral myocarditis, has had viral-type symptoms for several weeks and has had antibiotic courses at home prior to admission. Also considering autoimmune source (so far, rheumatologic serologies are all normal).  May need to consider endomyocardial bx at some point. Milrinone restarted 2/25 for recurrent profound shock. Co-ox now 64% on 0.375.  - Continue current milrinone for now.  - Volume status improving. CVP 11. Continue lasix 80 mg IV bid.  - I am concerned that James Bennett may need  advanced therapies. BMI currently 38 so over cutoff for transplant. At 6'3" would need  to be 280 pounds to qualify. James Bennett would be LVAD candidate. RA/PCWP on cath < 0.50.  - Continue digoxin 0.25.   - Increase lisinopril to 5 mg bid and continue spironolactone to 25 daily.  Give extra 40 meq K  - Need to be careful with ivabradine in setting of acute shock, continue for now.    - Would like to eventually get cardiac MRI but may be too large for MRI, ?tomorrow after full diuresis. 3. Acute respiratory failure: Suspect pulmonary edema plays a role but cannot rule out autoimmune pneumonitis process => serologies all negative so far. James Bennett is on nasal cannula now with diuresis 4. Rash: Petechial rash on lower legs, platelets not significantly low. ?Vasculitis playing a role here but so far autoimmune workup negative.  5. Ischemic toes: Suspect due to low flow/low cardiac output => think unlikely to be cardioembolic with symmetry. No LV thrombus noted on echo. James Bennett has dopplerable and now palpable lower extremity pulses.  Vascular has evaluated, recommend short course of anticoagulation.  Now that INR < 2, James Bennett is on heparin gtt.    6. Elevated LFTs: Prominent elevated bilirubin. Suspect this is related to congestive hepatopathy. LFTs now down with hemodynamic support 7. ID: Covering with vancomycin/Zosyn, ? PNA component.  PCT was very elevated.  Cultures negative. Stop antibiotics today.    8. Rheum: ?Autoimmune process like vasculitis. Elevated CRP, normal ESR. Autoimmune serologies all negative so far.Now off steroids.  9. AKI: Evaluated by renal, do not think there is autoimmune process involving kidneys, probably cardiorenal situation.  BUN/creatinine now normal with hemodynamic support. 10. Anxiety/depression - celexa and prn xanax started 2/25 11. Hypomagnesemia  --Check Mag  12. Blood Type A+.  James Grinder, NP-C 09/21/2015  James Bennett seen with NP, agree with the above note.  Co-ox good today on  current support at 64%.  CVP 11.  Will continue diuresis today and continue milrinone at current rate.  Re-developed shock over weekend and milrinone had to be restarted. Increase lisinopril to 5 mg bid.  I am concerned that James Bennett may need advanced therapies => blood type A+.  Suspect James Bennett will need home milrinone.  Will have him meet LVAD coordinators and eventually will need to get transplant evaluation if James Bennett does not turn around.  Discussed with James Bennett and mother.   Out of bed today, ambulate with PT.  35 minutes critical care time.  James Bennett 09/21/2015 8:35 AM

## 2015-09-21 NOTE — Progress Notes (Signed)
ANTICOAGULATION CONSULT NOTE  Pharmacy Consult for heparin Indication: possible emoblic event  No Known Allergies  Patient Measurements: Height: 6\' 2"  (188 cm) Weight: (!) 313 lb 0.9 oz (142 kg) IBW/kg (Calculated) : 82.2 Heparin Dosing Weight: 117 kg  Vital Signs: Temp: 99.1 F (37.3 C) (02/27 1243) Temp Source: Oral (02/27 1243) BP: 102/53 mmHg (02/27 1239) Pulse Rate: 104 (02/27 1239)  Labs:  Recent Labs  09/19/15 0520 09/20/15 0530 09/20/15 2240 09/21/15 0530 09/21/15 1230  HGB 11.7* 10.8*  --   --   --   HCT 38.0* 33.4*  --   --   --   PLT 156 163  --   --   --   LABPROT 18.0* 16.1*  --  15.3*  --   INR 1.48 1.28  --  1.19  --   HEPARINUNFRC 0.47 0.35  --  0.17* 0.15*  CREATININE 0.85 0.89 1.01 0.84  --     Estimated Creatinine Clearance: 210.5 mL/min (by C-G formula based on Cr of 0.84).   Medical History: Past Medical History  Diagnosis Date  . Bronchitis     Assessment: 20 yo m with no PMH admitted on 2/20 with bilateral LE swelling x 1 week and SOB x 2.5 months - treated for bronchitis/pna and viral illness as oupt.  Found to have an EF of 15% and newly diagnosed cardiomyopathy.  Pharmacy is consulted to dose heparin for a possible embolic event d/t cyanotic toes and feet.  Doppler negative for DVT, no VQ or CT scan but O2 sat 98% RA, and ECHO neg for clot.  Continue heparin while hypercoagulation work up on going.    INR was elevated on admission at 1.96, in setting of low CO and elevated LFTs - most probable d/t liver congestion.  INR 1.19 now  HL therapeutic at 0.35- yesterday but fell today to 0.17 this am and 0.15 despite rate increase this am. Hgb 10.8, Plt 163, no bleeding noted.  Goal of Therapy:  Heparin level 0.3-0.7 units/ml Monitor platelets by anticoagulation protocol: Yes   Plan:  Heparin bolus 3000 uts IV x1 Increase  heparin drip 2950 units/hr Check HL 6hr after rate increase - Daily heparin level, INR, and CBC - F/u plans for  long-term anticoagulation vs. stopping heparin  Leota Sauers Pharm.D. CPP, BCPS Clinical Pharmacist [phone] 09/21/2015 3:24 PM

## 2015-09-21 NOTE — Progress Notes (Signed)
Venous ultrasound showing R arm upper DVT in Right IJ and [place] vein. Right cephalic vein superficial thrombus also noted. Tonye Becket, NP and Shirlee Latch, MD notified and at bedside. Orders received. Family updated. Will continue to monitor.

## 2015-09-21 NOTE — Progress Notes (Signed)
Physical Therapy Treatment Patient Details Name: James Bennett MRN: [id number] DOB: 03-24-1996 Today's Date: 09/21/2015    History of Present Illness Patient is a 20 y/o male admitted with SOB and cough x 2.5 months and recent LE swelling and bluish color of toes.  Demonstrated 15% EF on echo with possible viral myocarditis. Did develop respiratory failure requiring bipap temporarily.    PT Comments    Very good participation and improving activity tolerance  Follow Up Recommendations  Home health PT;Supervision/Assistance - 24 hour     Equipment Recommendations  Rolling walker with 5" wheels;3in1 (PT)    Recommendations for Other Services  (Cardiac Rehab)     Precautions / Restrictions Precautions Precautions: Fall Precaution Comments: watch HR    Mobility  Bed Mobility Overal bed mobility: Needs Assistance Bed Mobility: Sit to Supine       Sit to supine: Min guard   General bed mobility comments: Minguard assist mostly fo rlines; not needing physical assist to lay down  Transfers Overall transfer level: Needs assistance Equipment used: Rolling walker (2 wheeled) Transfers: Sit to/from Stand Sit to Stand: +2 physical assistance;Mod assist         General transfer comment: light mod assist to stand from recliner  Ambulation/Gait Ambulation/Gait assistance: Min guard;+2 safety/equipment Ambulation Distance (Feet): 180 Feet Assistive device: Rolling walker (2 wheeled) Gait Pattern/deviations: Step-through pattern     General Gait Details: RW sized for optimal fit; Cues to self-monitor for activity tolerance; Noted HR max of 122 bpm, pt with no reports of fatigue during activity   Stairs            Wheelchair Mobility    Modified Rankin (Stroke Patients Only)       Balance             Standing balance-Leahy Scale: Fair                      Cognition Arousal/Alertness: Awake/alert Behavior During Therapy: WFL for tasks  assessed/performed Overall Cognitive Status: Within Functional Limits for tasks assessed                      Exercises      General Comments General comments (skin integrity, edema, etc.): Session conducted on Room Air; spot checked and sats were 96%      Pertinent Vitals/Pain Pain Assessment: No/denies pain    Home Living                      Prior Function            PT Goals (current goals can now be found in the care plan section) Acute Rehab PT Goals Patient Stated Goal: To go home PT Goal Formulation: With patient/family Time For Goal Achievement: 09/25/15 Potential to Achieve Goals: Good Progress towards PT goals: Progressing toward goals    Frequency  Min 3X/week    PT Plan Current plan remains appropriate    Co-evaluation             End of Session Equipment Utilized During Treatment: Gait belt Activity Tolerance: Patient tolerated treatment well Patient left: in bed;with call bell/phone within reach     Time: 1510-1539 PT Time Calculation (min) (ACUTE ONLY): 29 min  Charges:  $Gait Training: 23-37 mins                    G Codes:      Van Clines  [place] 09/21/2015, 4:43 PM  Van Clines, PT  Acute Rehabilitation Services Pager [phone] Office [phone]

## 2015-09-21 NOTE — Progress Notes (Signed)
Called by nursing staff for RUE edema  RUE 2+ edema. Able to doppler radial pulse. Has been on Heparin.   Check RUE venous ultrasound.   Dr Shirlee Latch aware and agrees with plan.   Amy Clegg  NP-C 4:29 PM  DVT found in right arm.  Per family, arm swollen since prior to admission.  ?DVT from low flow.  Doubt HIT as platelets higher.  Would stop heparin gtt as it has been subtherapeutic and start bivalirudin.  Will need coumadin (given size, would probably use this in preference to NOAC).   Marca Ancona 09/21/2015 5:05 PM

## 2015-09-21 NOTE — Progress Notes (Addendum)
ANTICOAGULATION CONSULT NOTE - Follow Up Consult  Pharmacy Consult for Angiomax Indication: DVT on IV heparin  No Known Allergies  Patient Measurements: Height: 6\' 2"  (188 cm) Weight: (!) 309 lb 8 oz (140.388 kg) IBW/kg (Calculated) : 82.2  Vital Signs: Temp: 99.1 F (37.3 C) (02/27 1243) Temp Source: Oral (02/27 1243) BP: 102/53 mmHg (02/27 1239) Pulse Rate: 104 (02/27 1239)  Labs:  Recent Labs  09/19/15 0520 09/20/15 0530 09/20/15 2240 09/21/15 0530 09/21/15 1230 09/21/15 1641  HGB 11.7* 10.8*  --   --   --  11.1*  HCT 38.0* 33.4*  --   --   --  35.4*  PLT 156 163  --   --   --  307  LABPROT 18.0* 16.1*  --  15.3*  --   --   INR 1.48 1.28  --  1.19  --   --   HEPARINUNFRC 0.47 0.35  --  0.17* 0.15*  --   CREATININE 0.85 0.89 1.01 0.84  --   --     Estimated Creatinine Clearance: 209.3 mL/min (by C-G formula based on Cr of 0.84).   Assessment: 20 yo m with no PMH admitted on 2/20 with bilateral LE swelling x 1 week and SOB x 2.5 months - treated for bronchitis/pna and viral illness as oupt.Found to have an EF of 15% and newly diagnosed cardiomyopathy. He has been on IV heparin for a possible embolic event d/t cyanotic toes and feet. initial LE doppler negative for DVT, no VQ or CT scan but O2 sat 98% RA, and ECHO neg for clot. Now upper extremity doppler is positive for acute thrombus in right Internal jugular vein and subclavian vein. Right cephalic vein superificial vein thrombus. Pharmacy is consulted to change heparin to angiomax.   Goal of Therapy:  APTT = 50-85    Monitor platelets by anticoagulation protocol: Yes   Plan:  - D/c IV heparin - Start angiomax 0.15mg /kg/hr = 42.1 ml/hr - f/u aPTT at 0800   Bayard Hugger, PharmD, BCPS  Clinical Pharmacist  Pager: [phone]   09/21/2015,5:10 PM   Addendum: 2 hr aPTT level = 52, low-end goal.  Will continue current rate, recheck aPTT at midnight  Bayard Hugger, PharmD, BCPS  Clinical Pharmacist  Pager:  [phone]

## 2015-09-21 NOTE — Progress Notes (Signed)
ANTICOAGULATION CONSULT NOTE - Follow Up Consult  Pharmacy Consult for heparin Indication: possible embolic event  Labs:  Recent Labs  09/19/15 0520 09/20/15 0530 09/20/15 2240 09/21/15 0530  HGB 11.7* 10.8*  --   --   HCT 38.0* 33.4*  --   --   PLT 156 163  --   --   LABPROT 18.0* 16.1*  --  15.3*  INR 1.48 1.28  --  1.19  HEPARINUNFRC 0.47 0.35  --  0.17*  CREATININE 0.85 0.89 1.01 0.84     Assessment: 20yo male now subtherapeutic on heparin after several levels at goal though had been trending down.  Goal of Therapy:  Heparin level 0.3-0.7 units/ml   Plan:  Will bolus with heparin 3000 units and increase gtt by 2 units/kg/hr to 2700 units/hr and check level in 6hr.  Vernard Gambles, PharmD, BCPS  09/21/2015,6:37 AM

## 2015-09-21 NOTE — Progress Notes (Signed)
VASCULAR LAB PRELIMINARY  PRELIMINARY  PRELIMINARY  PRELIMINARY  Right upper extremity venous duplex completed.     Right arm upper- DVT noted acute thrombus in right Internal jugular vein and subclavian vein. Right cephalic vein superificial vein thrombus.     Jenetta Loges, RVT, RDMS 09/21/2015, 4:36 PM

## 2015-09-21 NOTE — Progress Notes (Signed)
Give result of exam the patient nurse @4 :40 pm.

## 2015-09-22 ENCOUNTER — Encounter (HOSPITAL_COMMUNITY): Payer: Self-pay | Admitting: Oncology

## 2015-09-22 DIAGNOSIS — I82629 Acute embolism and thrombosis of deep veins of unspecified upper extremity: Secondary | ICD-10-CM

## 2015-09-22 DIAGNOSIS — D696 Thrombocytopenia, unspecified: Secondary | ICD-10-CM

## 2015-09-22 DIAGNOSIS — J129 Viral pneumonia, unspecified: Secondary | ICD-10-CM

## 2015-09-22 DIAGNOSIS — I400 Infective myocarditis: Secondary | ICD-10-CM

## 2015-09-22 DIAGNOSIS — I409 Acute myocarditis, unspecified: Secondary | ICD-10-CM

## 2015-09-22 HISTORY — DX: Acute embolism and thrombosis of deep veins of unspecified upper extremity: I82.629

## 2015-09-22 HISTORY — DX: Thrombocytopenia, unspecified: D69.6

## 2015-09-22 LAB — CBC
HEMATOCRIT: 32.5 % — AB (ref 39.0–52.0)
Hemoglobin: 10.2 g/dL — ABNORMAL LOW (ref 13.0–17.0)
MCH: 25.5 pg — ABNORMAL LOW (ref 26.0–34.0)
MCHC: 31.4 g/dL (ref 30.0–36.0)
MCV: 81.3 fL (ref 78.0–100.0)
Platelets: 306 10*3/uL (ref 150–400)
RBC: 4 MIL/uL — ABNORMAL LOW (ref 4.22–5.81)
RDW: 20.4 % — ABNORMAL HIGH (ref 11.5–15.5)
WBC: 17.4 10*3/uL — AB (ref 4.0–10.5)

## 2015-09-22 LAB — COXSACKIE B VIRUS ANTIBODIES
COXSACKIE B1 AB: NEGATIVE
COXSACKIE B2 AB: NEGATIVE
COXSACKIE B3 AB: NEGATIVE
COXSACKIE B4 AB: NEGATIVE
COXSACKIE B5 AB: NEGATIVE
COXSACKIE B6 AB: NEGATIVE

## 2015-09-22 LAB — MAGNESIUM: MAGNESIUM: 2 mg/dL (ref 1.7–2.4)

## 2015-09-22 LAB — APTT
APTT: 56 s — AB (ref 24–37)
APTT: 58 s — AB (ref 24–37)

## 2015-09-22 LAB — BASIC METABOLIC PANEL
ANION GAP: 8 (ref 5–15)
BUN: 15 mg/dL (ref 6–20)
CALCIUM: 8.1 mg/dL — AB (ref 8.9–10.3)
CO2: 29 mmol/L (ref 22–32)
Chloride: 96 mmol/L — ABNORMAL LOW (ref 101–111)
Creatinine, Ser: 0.74 mg/dL (ref 0.61–1.24)
Glucose, Bld: 96 mg/dL (ref 65–99)
POTASSIUM: 3.9 mmol/L (ref 3.5–5.1)
Sodium: 133 mmol/L — ABNORMAL LOW (ref 135–145)

## 2015-09-22 LAB — SAVE SMEAR

## 2015-09-22 LAB — HEPATITIS PANEL, ACUTE
HEP A IGM: NEGATIVE
Hep B C IgM: NEGATIVE
Hepatitis B Surface Ag: NEGATIVE

## 2015-09-22 LAB — HEMOGLOBIN A1C
HEMOGLOBIN A1C: 6.7 % — AB (ref 4.8–5.6)
MEAN PLASMA GLUCOSE: 146 mg/dL

## 2015-09-22 LAB — COXSACKIE A VIRUS ANTIBODIES
COXSACKIE A9 IGM: NEGATIVE {titer}
Coxsackie A16 IgM: NEGATIVE titer
Coxsackie A24 IgG: 1:800 {titer} — ABNORMAL HIGH
Coxsackie A24 IgM: NEGATIVE titer
Coxsackie A7 IgM: NEGATIVE titer

## 2015-09-22 LAB — HEPATITIS B SURFACE ANTIBODY,QUALITATIVE: Hep B S Ab: REACTIVE

## 2015-09-22 LAB — HEPARIN INDUCED PLATELET AB (HIT ANTIBODY): HEPARIN INDUCED PLT AB: 0.706 {OD_unit} — AB (ref 0.000–0.400)

## 2015-09-22 LAB — CARBOXYHEMOGLOBIN
Carboxyhemoglobin: 2 % — ABNORMAL HIGH (ref 0.5–1.5)
Methemoglobin: 0.9 % (ref 0.0–1.5)
O2 SAT: 60.2 %
TOTAL HEMOGLOBIN: 10.4 g/dL — AB (ref 13.5–18.0)

## 2015-09-22 MED ORDER — WARFARIN SODIUM 7.5 MG PO TABS
7.5000 mg | ORAL_TABLET | Freq: Once | ORAL | Status: AC
  Administered 2015-09-22: 7.5 mg via ORAL
  Filled 2015-09-22: qty 1

## 2015-09-22 MED ORDER — LISINOPRIL 5 MG PO TABS
7.5000 mg | ORAL_TABLET | Freq: Two times a day (BID) | ORAL | Status: DC
  Administered 2015-09-22 (×2): 7.5 mg via ORAL
  Filled 2015-09-22 (×2): qty 1

## 2015-09-22 MED ORDER — MAGNESIUM OXIDE 400 (241.3 MG) MG PO TABS
400.0000 mg | ORAL_TABLET | Freq: Two times a day (BID) | ORAL | Status: DC
  Administered 2015-09-22 – 2015-10-01 (×19): 400 mg via ORAL
  Filled 2015-09-22 (×19): qty 1

## 2015-09-22 MED ORDER — WARFARIN - PHARMACIST DOSING INPATIENT
Freq: Every day | Status: DC
  Administered 2015-09-22: 18:00:00
  Administered 2015-09-23: 1
  Administered 2015-09-24 – 2015-09-30 (×4)

## 2015-09-22 MED ORDER — POTASSIUM CHLORIDE CRYS ER 20 MEQ PO TBCR
40.0000 meq | EXTENDED_RELEASE_TABLET | Freq: Every day | ORAL | Status: DC
  Administered 2015-09-22 – 2015-10-01 (×10): 40 meq via ORAL
  Filled 2015-09-22 (×10): qty 2

## 2015-09-22 NOTE — Consult Note (Signed)
[address]       [place] [address]             [phone]        Sancho [facility] Medical Record #[id number] Date of Birth: [birth year]  Referring: Dr Gala Romney Primary Care: No primary care provider on file.  Chief omplaint:  edema, cyanotic toes:    Chief Complaint  Patient presents with  . Foot Swelling   patient examined, echocardiogram, cardiac catheterization, chest CT scans all personally reviewed and counseled with patient.  History of Present Illness:     20 year old Caucasian male admitted to the hospital from urgent care when he presented with progressive illness over the previous weeks with cough, swelling, and development of cyanosis of the toes of each foot. Prior to admission he been treated with azithromycin and a prednisone taper. At the time of his admission he had elevated liver enzymes elevated BUN and and echocardiogram showing severe LV dysfunction, EF 15% with moderate mitral regurgitation and mild-moderate RV dysfunction with mild TR. No pericardial effusion. Patient was admitted to critical care with a diagnosis of viral myocarditis and cardiology consultation was requested. The patient underwent right heart catheterization demonstrating PA pressures 55/35 with wedge of 35-40, CVP 19, mixed venous saturation 53% with cardiac index 2.2. The patient was placed on milrinone and diuretics and his mixed venous saturation significant improved and he had tremendous diuresis.  His viral titers for hepatitis were all negative. HIV titers were negative. Coxsackie virus titers recently returned elevated.  Currently the patient is stable on milrinone and a stepdown bed with good appetite and able to ambulate several 100 feet at a time. Doppler studies show good perfusion of each foot-ABIs. CT scan of the chest shows some atelectasis and mild pleural effusions.  The patient was placed on heparin for treatment of his ischemic toes. While on  heparin he developed a swollen right upper extremity and by Doppler exam was found to have DVT of the right jugular right subclavian right axillary veins. He is now on bivalirudin and will be started on Coumadin. His platelet count remains normal and Hit panel is pending  His CVP now is in the normal range. Plans are made to treat the patient with long-term milrinone and follow in the heart failure clinic as long as he continues to progress. I was asked to speak to the patient about implantable VAD therapy only in the circumstance that he would relapse in the future and need mechanical support.   Current Activity/ Functional Status: The patient was with normal function prior to his acute illness of viral myocarditis. He is employed and had normal activities. He lives with his parents.   Zubrod Score: At the time of surgery this patient's most appropriate activity status/level should be described as: []     0    Normal activity, no symptoms []     1    Restricted in physical strenuous activity but ambulatory, able to do out light work []     2    Ambulatory and capable of self care, unable to do work activities, up and about                 more than 50%  Of the time                            [x]     3    Only  limited self care, in bed greater than 50% of waking hours []     4    Completely disabled, no self care, confined to bed or chair []     5    Moribund  Past Medical History  Diagnosis Date  . Bronchitis   . Deep vein thrombosis (DVT) of upper extremity (HCC) 09/22/2015    right  . Thrombocytopenia (HCC) 09/22/2015    Mild, transient    Past Surgical History  Procedure Laterality Date  . Cardiac catheterization N/A 09/17/2015    Procedure: Right Heart Cath;  Surgeon: Laurey Morale, MD;  Location: [facility] INVASIVE CV LAB;  Service: Cardiovascular;  Laterality: N/A;    History  Smoking status  . Never Smoker   Smokeless tobacco  . Not on file    History  Alcohol Use No    Social  History   Social History  . Marital Status: Single    Spouse Name: N/A  . Number of Children: N/A  . Years of Education: N/A   Occupational History  . Not on file.   Social History Main Topics  . Smoking status: Never Smoker   . Smokeless tobacco: Not on file  . Alcohol Use: No  . Drug Use: No  . Sexual Activity: Not on file   Other Topics Concern  . 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 Bird exposure through his father's workplace of a small parrot. No mold exposure. No recent travel.    No Known Allergies  Current Facility-Administered Medications  Medication Dose Route Frequency Provider Last Rate Last Dose  . 0.9 %  sodium chloride infusion  250 mL Intravenous PRN Pincus Large, DO 10 mL/hr at 09/21/15 1900 250 mL at 09/21/15 1900  . 0.9 %  sodium chloride infusion   Intravenous Continuous Pincus Large, DO 10 mL/hr at 09/20/15 0013    . acetaminophen (TYLENOL) tablet 650 mg  650 mg Oral Q4H PRN Laurey Morale, MD   650 mg at 09/18/15 2130  . ALPRAZolam Prudy Feeler) tablet 0.25 mg  0.25 mg Oral Q4H PRN Dolores Patty, MD      . antiseptic oral rinse (CPC / CETYLPYRIDINIUM CHLORIDE 0.05%) solution 7 mL  7 mL Mouth Rinse BID Roslynn Amble, MD   7 mL at 09/21/15 2200  . bivalirudin (ANGIOMAX) 250 mg in sodium chloride 0.9 % 500 mL (0.5 mg/mL) infusion  0.15 mg/kg/hr Intravenous Continuous Robinette Haines, RPH 42.1 mL/hr at 09/22/15 0616 0.15 mg/kg/hr at 09/22/15 0616  . citalopram (CELEXA) tablet 10 mg  10 mg Oral Daily Dolores Patty, MD   10 mg at 09/22/15 0900  . digoxin (LANOXIN) tablet 0.25 mg  0.25 mg Oral Daily Laurey Morale, MD   0.25 mg at 09/22/15 0901  . famotidine (PEPCID) tablet 20 mg  20 mg Oral BID Pincus Large, DO   20 mg at 09/22/15 0900  . furosemide (LASIX) injection 80 mg  80 mg Intravenous TID AC Dolores Patty, MD   80 mg at 09/22/15 1218  . ivabradine  (CORLANOR) tablet 5 mg  5 mg Oral BID WC Laurey Morale, MD   5 mg at 09/22/15 0845  . lisinopril (PRINIVIL,ZESTRIL) tablet 7.5 mg  7.5 mg Oral BID Amy D Clegg, NP   7.5 mg at 09/22/15 1610  . magnesium oxide (MAG-OX) tablet 400 mg  400  mg Oral BID Laurey Morale, MD   400 mg at 09/22/15 0901  . milrinone (PRIMACOR) 20 MG/100ML (0.2 mg/mL) infusion  0.375 mcg/kg/min Intravenous Continuous Dolores Patty, MD 17 mL/hr at 09/22/15 1217 0.375 mcg/kg/min at 09/22/15 1217  . ondansetron (ZOFRAN) injection 4 mg  4 mg Intravenous Q6H PRN Laurey Morale, MD      . polyethylene glycol (MIRALAX / GLYCOLAX) packet 17 g  17 g Oral Daily Richarda Overlie, MD   17 g at 09/18/15 1100  . potassium chloride SA (K-DUR,KLOR-CON) CR tablet 40 mEq  40 mEq Oral Daily Dolores Patty, MD   40 mEq at 09/22/15 1200  . sodium chloride flush (NS) 0.9 % injection 10-40 mL  10-40 mL Intracatheter Q12H Laurey Morale, MD   10 mL at 09/21/15 1000  . sodium chloride flush (NS) 0.9 % injection 10-40 mL  10-40 mL Intracatheter PRN Laurey Morale, MD   30 mL at 09/19/15 0325  . spironolactone (ALDACTONE) tablet 25 mg  25 mg Oral Daily Laurey Morale, MD   25 mg at 09/22/15 0900  . warfarin (COUMADIN) tablet 7.5 mg  7.5 mg Oral ONCE-1800 Dolores Patty, MD      . Warfarin - Pharmacist Dosing Inpatient   Does not apply q1800 Dolores Patty, MD      . zolpidem (AMBIEN) tablet 5 mg  5 mg Oral QHS PRN Richarda Overlie, MD   5 mg at 09/22/15 0044    No prescriptions prior to admission    Family History  Problem Relation Age of Onset  . Lupus Mother      Review of Systems:       Cardiac Review of Systems: Y or N  Chest Pain [   no ]  Resting SOB [ now improved after diuresis  ] Exertional SOB  [improved after diuresis  ]  Orthopnea [ improved after diuresis ]   Pedal Edema [improved after diuresis   ]    Palpitations [ no ] Syncope  [no  ]   Presyncope [ no  ]  General Review of Systems: [Y] = yes [   ]=no Constitional: recent weight change [ yes from edema ]; anorexia [  ]; fatigue Mahler.Beck  ]; nausea [  ]; night sweats [  ]; fever [  ]; or chills [  ]                                                               Dental: poor dentition[  ]; Last Dentist visit: Every year  Eye : blurred vision [  ]; diplopia [   ]; vision changes [  ];  Amaurosis fugax[  ]; Resp: cough [  ];  wheezing[  ];  hemoptysis[  ]; shortness of breath[  ]; paroxysmal nocturnal dyspnea[  ]; dyspnea on exertion[  yes]; or orthopnea[ yes ];  GI:  gallstones[  ], vomiting[  ];  dysphagia[  ]; melena[  ];  hematochezia [  ]; heartburn[  ];   Hx of  Colonoscopy[  ]; GU: kidney stones [  ]; hematuria[  ];   dysuria [  ];  nocturia[  ];  history of     obstruction [  ]; urinary frequency [  ]  Skin: rash, swelling[  ];, hair loss[  ];  peripheral edema[  ];  or itching[  ]; acrocyanosis of his toes Musculosketetal: myalgias[  ];  joint swelling[  ];  joint erythema[  ];  joint pain[  ];  back pain[  ];  Heme/Lymph: bruising[  ];  bleeding[  ];  anemia[  ];  Neuro: TIA[  ];  headaches[  ];  stroke[  ];  vertigo[  ];  seizures[  ];   paresthesias[  ];  difficulty walking[  ];  Psych:depression[  ]; anxiety[  ];  Endocrine: diabetes[  ];  thyroid dysfunction[  ];  Immunizations: Flu [ yes ]; Pneumococcal[unknown ];  Other: Right hand dominant  Physical Exam: BP 106/58 mmHg  Pulse 117  Temp(Src) 98.3 F (36.8 C) (Oral)  Resp 24  Ht 6\' 2"  (1.88 m)  Wt 304 lb 0.2 oz (137.9 kg)  BMI 39.02 kg/m2  SpO2 98%        Physical Exam  General: Obese young Caucasian male sitting comfortably in a recliner no acute distress in the CCU on IV milrinone HEENT: Normocephalic pupils equal , dentition adequate Neck: Supple without JVD, adenopathy, or bruit Chest: Clear to auscultation, symmetrical breath sounds, no rhonchi, no tenderness             or deformity Cardiovascular: Regular rate and rhythm, loud S3 gallop, ,  peripheral pulses             palpable in all extremities Abdomen:  Soft, nontender, no palpable mass or organomegaly Extremities: Warm, well-perfused, no clubbing  edema or tenderness, cyanosis of the toes distally with some erythema at the transition zone, no drainage or cellulitis              no venous stasis changes of the legs Rectal/GU: Deferred Neuro: Grossly non--focal and symmetrical throughout Skin: Clean and dry without rash or ulceration   Diagnostic Studies & Laboratory data:   Blood work, CT scan, echocardiogram, right heart cath data all reviewed  Recent Radiology Findings:     I have independently reviewed the above radiologic studies.  Recent Lab Findings: Lab Results  Component Value Date   WBC 17.4* 09/22/2015   HGB 10.2* 09/22/2015   HCT 32.5* 09/22/2015   PLT 306 09/22/2015   GLUCOSE 96 09/22/2015   CHOL 151 09/21/2015   TRIG 178* 09/21/2015   HDL 16* 09/21/2015   LDLCALC 99 09/21/2015   ALT 93* 09/20/2015   AST 59* 09/20/2015   NA 133* 09/22/2015   K 3.9 09/22/2015   CL 96* 09/22/2015   CREATININE 0.74 09/22/2015   BUN 15 09/22/2015   CO2 29 09/22/2015   TSH 3.110 09/14/2015   INR 1.28 09/21/2015   HGBA1C 6.7* 09/21/2015      Assessment / Plan:     Acute viral myocarditis-coxsackie titers high Subacute cardiogenic shock , acute systolic heart failure Clinical improvement with milrinone and diuretics Acrocyanosis of the toes of each foot but without evidence of infection DVT of right upper extremity probably due to hypercoagulability from systemic inflammatory reaction Probable hepatic inflammation from viral infection  Agree with plan to use  long-term home milrinone inotropic support and wait to see if his cardiac function improves. The patient understands that if he does not continue to improve as we would expect or he decompensates then  cardiac mechanical support or VAD would be available to provide temporary or permanent circulatory  support as needed.   @ME1 @ 09/22/2015 5:13 PM

## 2015-09-22 NOTE — Care Management Note (Signed)
Case Management Note  Patient Details  Name: James Bennett MRN: [id number] Date of Birth: 18-Feb-1996  Subjective/Objective:   Adm w chf                Action/Plan: lives w fam  Expected Discharge Date:                  Expected Discharge Plan:  Home w Home Health Services  In-House Referral:     Discharge planning Services  CM Consult  Post Acute Care Choice:  Durable Medical Equipment, Home Health Choice offered to:  Patient, Parent  DME Arranged:  IV pump/equipment, Vest life vest DME Agency:  [facility] Other - Comment  HH Arranged:  RN, Disease Management HH Agency:  [facility]  Status of Service:     Medicare Important Message Given:    Date Medicare IM Given:    Medicare IM give by:    Date Additional Medicare IM Given:    Additional Medicare Important Message give by:     If discussed at Long Length of Stay Meetings, dates discussed:    Additional Comments: orders for poss home milrinone and lifevest. Have placed lifevest form on shadow chart for md to sign. Have spoken w pt and mother and went over hhc agency list. No pref. Ref to pam and donna w [facility] for home iv milrinone. Pt hopes to dc sometime this week.  Hanley Hays, RN 09/22/2015, 9:55 AM

## 2015-09-22 NOTE — Progress Notes (Signed)
Patient ID: James Bennett, male   DOB: 05-Oct-1995, 20 y.o.   MRN: [id number]    Advanced Heart Failure Rounding Note   Subjective:    Milrinone restarted on 2/25 due to low co-ox (30-40s). Co-ox today 60% on milrinone 0.375. Weight coming down. RUE edema. Had ultrasound + DVT on 2/27. Heparin stopped (had been hard to keep PTT therapeutic) and switched to Bivalirudin. Also lisinopril was increased to 5 mg twice a day.   Denies SOB/Orthopnea.    ECHO 09/15/2015 EF 15% IVC dilated RV mildly decreased.   R[facility] Procedural Findings (2/23): Hemodynamics (mmHg) RA mean 19 RV 54/19 PA 51/32, mean 45 PCWP mean 40 Oxygen saturations: PA 57% AO 96% Cardiac Output (Fick) 6.02  Cardiac Index (Fick) 2.24 Cardiac Output (Thermo) 6.88 Cardiac Index (Thermo) 2.56  Objective:   Weight Range:  Vital Signs:   Temp:  [97.8 F (36.6 C)-99.6 F (37.6 C)] 97.8 F (36.6 C) (02/28 0334) Pulse Rate:  [101-125] 114 (02/28 0334) Resp:  [20-35] 20 (02/28 0334) BP: (102-129)/(48-67) 126/67 mmHg (02/28 0334) SpO2:  [96 %-98 %] 98 % (02/28 0334) Weight:  [304 lb 0.2 oz (137.9 kg)-309 lb 8 oz (140.388 kg)] 304 lb 0.2 oz (137.9 kg) (02/28 0500) Last BM Date: 09/21/15  Weight change: Filed Weights   09/21/15 0500 09/21/15 1554 09/22/15 0500  Weight: 313 lb 0.9 oz (142 kg) 309 lb 8 oz (140.388 kg) 304 lb 0.2 oz (137.9 kg)    Intake/Output:   Intake/Output Summary (Last 24 hours) at 09/22/15 0719 Last data filed at 09/22/15 0630  Gross per 24 hour  Intake 2362.87 ml  Output   7125 ml  Net -4762.13 ml     Physical Exam: CVP 11 General:  Sitting on side of bed.  HEENT: normal. LIJ  Neck: Thick. JVP 10. Carotids 2+ bilat; no bruits. No lymphadenopathy or thryomegaly appreciated. Cor: PMI laterally displaced.  Tachy, regular rate & rhythm. +S3.  No murmur.  Lungs: clear anteriorly Abdomen: obese, soft, nontender, nondistended. No hepatosplenomegaly. No bruits or masses. Good bowel  sounds. Extremities: no cyanosis, clubbing.  1+ ankle edema. R and L toes purple.  RUE edema.  Neuro: alert & orientedx3, cranial nerves grossly intact. moves all 4 extremities w/o difficulty. Affect pleasant  Telemetry: Sinus Tach 110s-120s   Labs: Basic Metabolic Panel:  Recent Labs Lab 09/16/15 0030  09/17/15 1544  09/20/15 0530 09/20/15 2240 09/21/15 0530 09/21/15 1700 09/21/15 1816 09/22/15 0430  NA 134*  < >  --   < > 136 135 132* 133*  --  133*  K 3.3*  < >  --   < > 3.6 3.9 3.6 3.7  --  3.9  CL 98*  < >  --   < > 95* 93* 92* 93*  --  96*  CO2 25  < >  --   < > 32 31 31 29   --  29  GLUCOSE 165*  < >  --   < > 93 98 92 97  --  96  BUN 47*  < >  --   < > 21* 19 17 18   --  15  CREATININE 1.34*  < >  --   < > 0.89 1.01 0.84 0.96  --  0.74  CALCIUM 7.5*  < >  --   < > 7.4* 7.9* 7.9* 8.1*  --  8.1*  MG 1.6*  < > 1.7  --  1.3*  --   --  1.5* 1.6*  2.0  PHOS 4.0  --   --   --   --   --   --   --   --   --   < > = values in this interval not displayed.  Liver Function Tests:  Recent Labs Lab 09/15/15 1059 09/16/15 0030 09/18/15 0400 09/19/15 0520 09/20/15 0530  AST  --  62* 134* 143* 59*  ALT  --  54 97* 140* 93*  ALKPHOS  --  67 73 95 74  BILITOT 4.3* 3.3* 2.6* 3.7* 3.3*  PROT  --  5.2* 5.1* 5.7* 5.1*  ALBUMIN  --  2.1* 2.0* 2.1* 1.9*   No results for input(s): LIPASE, AMYLASE in the last 168 hours. No results for input(s): AMMONIA in the last 168 hours.  CBC:  Recent Labs Lab 09/18/15 0400 09/19/15 0520 09/20/15 0530 09/21/15 1641 09/22/15 0430  WBC 15.2* 17.3* 18.5* 19.6* 17.4*  NEUTROABS  --   --  15.0*  --   --   HGB 12.2* 11.7* 10.8* 11.1* 10.2*  HCT 37.5* 38.0* 33.4* 35.4* 32.5*  MCV 83.3 83.5 82.1 81.2 81.3  PLT 145* 156 163 307 306    Cardiac Enzymes:  Recent Labs Lab 09/15/15 1059  CKTOTAL 1116*  TROPONINI 0.08*    BNP: BNP (last 3 results)  Recent Labs  09/14/15 1610  BNP 1312.6*    ProBNP (last 3 results) No results for  input(s): PROBNP in the last 8760 hours.    Other results:  Imaging: No results found.   Medications:     Scheduled Medications: . antiseptic oral rinse  7 mL Mouth Rinse BID  . citalopram  10 mg Oral Daily  . digoxin  0.25 mg Oral Daily  . famotidine  20 mg Oral BID  . furosemide  80 mg Intravenous TID AC  . ivabradine  5 mg Oral BID WC  . lisinopril  5 mg Oral BID  . magnesium oxide  400 mg Oral Daily  . polyethylene glycol  17 g Oral Daily  . sodium chloride flush  10-40 mL Intracatheter Q12H  . spironolactone  25 mg Oral Daily    Infusions: . sodium chloride 10 mL/hr at 09/20/15 0013  . bivalirudin (ANGIOMAX) infusion 0.5 mg/mL (Non-ACS indications) 0.15 mg/kg/hr (09/22/15 0616)  . milrinone 0.375 mcg/kg/min (09/22/15 0616)    PRN Medications: sodium chloride, acetaminophen, ALPRAZolam, ondansetron (ZOFRAN) IV, sodium chloride flush, zolpidem   Assessment/Plan/ Discussion     1. Cardiogenic shock 2. Acute systolic CHF: EF 49% by echo with mildly dilated and mildly dysfunctional RV. Low output HF with marked volume overload initially.  Was on milrinone but able to titrate off initially.  Marydel on 2/23 showed good cardiac output off milrinone but still very volume overloaded. Milrinone restarted 2/25 for recurrent profound shock.  I am concerned for viral myocarditis, has had viral-type symptoms for several weeks and has had antibiotic courses at home prior to admission. Also considering autoimmune source (so far, rheumatologic serologies are all normal).  May need to consider endomyocardial bx at some point. Co-ox now 60% on milrinone 0.375, CVP 11.  Walking more, stronger.  - Continue current milrinone at current dose for now.  - Volume status improving. CVP 11. Continue lasix 80 mg IV bid.  - Concerned he may need advanced therapies. BMI currently 38 so over cutoff for transplant. At 6'3" would need to be 280 pounds to qualify. He would be LVAD candidate. RA/PCWP on  cath < 0.50.  Suspect  he will need home milrinone with attempt at slow weaning.  - Continue digoxin 0.25.   - Increase  lisinopril to 7.5 mg bid and continue spironolactone 25 daily.    - Tolerating ivabradine, continue with sinus tachycardia.    - Would like to eventually get cardiac MRI but may be too large for MRI, ?tomorrow after full diuresis. 3. Acute respiratory failure: Suspect pulmonary edema plays a role but cannot rule out autoimmune pneumonitis process => serologies all negative so far. He is on nasal cannula now with diuresis 4. Rash: Petechial rash on lower legs, platelets not significantly low. ?Vasculitis playing a role here but so far autoimmune workup negative. Possibly just excoriation.  5. Ischemic toes: Suspect due to low flow/low cardiac output => think unlikely to be cardioembolic with symmetry. No LV thrombus noted on echo. He has dopplerable and now palpable lower extremity pulses.  Vascular has evaluated, recommend short course of anticoagulation.  Now that INR < 2, he is on bivalirudin gtt.    6. Elevated LFTs: Prominent elevated bilirubin. Suspect this is related to congestive hepatopathy. LFTs now down with hemodynamic support 7. ID: Had course of vancomycin/Zosyn, ? PNA component.  PCT was very elevated.  Cultures negative. Off antibiotics now. WBC 17.4 .    8. Rheum: ?Autoimmune process like vasculitis. Elevated CRP, normal ESR. Autoimmune serologies all negative so far.Now off steroids.  9. AKI: Evaluated by renal, do not think there is autoimmune process involving kidneys, probably cardiorenal situation.  BUN/creatinine now normal with hemodynamic support. 10. Anxiety/depression: Celexa and prn Xanax started 2/25 11. Hypomagnesemia : Today mag 2.0   12. Blood Type A+ 13. RUE DVT: Confirmed on Ultrasound 2/27. Was on heparin gtt but PTT had been low. Now on bivalirudin.  Had not had PICC or CVL in right upper extremity.  Start coumadin.  Will ask for hematology  opinion, may just be low flow/stasis that triggered this. Platelets stable, unlikely HIT.  14. NSVT: Yesterday received 4 grams Mag. Todays Mag 2.0 K 3.9. He will need Lifevest when discharged since he will likely be on milrinone. It has been requested.  15. Hypoalbuminemia- Albumin on 2/26 1.9. Prealbumin 9.5 Consult dietitian.   Darrick Grinder, NP-C 09/22/2015   Patient seen with NP, agree with the above note.  Stable today, weight coming down.  Still volume overloaded.  Co-ox 60% on current milrinone.   - Continue Lasix 80 mg IV bid today.  - Increase lisinopril to 7.5 mg bid.  - He will likely need to go home on milrinone, will attempt slow wean.  If unable to wean, will need LVAD.  Weight will need to come down for transplant.   RUE DVT noted despite heparin gtt (heparin subtherapeutic, difficult with size).  No line in that arm.  Now on bivalirudin, starting on coumadin.  Platelets normal, doubt HIT.  Will ask hematology to see.   Discussed with patient and father.   35 minutes critical care time.   Loralie Champagne 09/22/2015 8:11 AM

## 2015-09-22 NOTE — Progress Notes (Signed)
Initial Nutrition Assessment  DOCUMENTATION CODES:   Obesity unspecified  INTERVENTION:   -Continue Heart Healthy diet -Provided Heart Healthy diet education- provided "Heart Healthy Nutrition Therapy: Cooking Tips", "Heart Healthy Nutrition Therapy: Shopping Tips", "Heart Healthy Nutrition Therapy: Label Reading Tips", and "Sodium Free Seasoning Tips" handouts from AND's Nutrition Care Manual  NUTRITION DIAGNOSIS:   Food and nutrition related knowledge deficit related to limited prior education as evidenced by other (see comment) (new diagnosis of CHF).   GOAL:   Patient will meet greater than or equal to 90% of their needs  MONITOR:   PO intake, Labs, Weight trends, Skin, I & O's  REASON FOR ASSESSMENT:   Consult  (low albumin/prealbumin)  ASSESSMENT:   20 y.o. male presented with bilateral leg swelling and dyspnea for last 2 months not improved with steroids and antibiotics. 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 Per MD notes, CT scan revealed pneumonitis and nodular infiltrates.   Spoke with RN prior to visiting with pt. She reports pt is eating well. She reports pt has multiple tests coming up and has been diagnosed with CHF. She reports that she cause is likely viral in nature. She reveals pt may require LVAD vs heart transplant.   Pt underwent rt heart cath on 09/17/15.   Hx obtained from pt and pt mother at bedside. Both confirm that appetite is very good and is eating all foods off meal trays (meal completion 100% per doc flowsheets). PTA, pt reports decreased appetite over the past 3 weeks due to being diagnosed with bronchitis. Pt mother reports that pt has been sedentary over the past 3 weeks PTA, due to weakness, which is unusual for pt. Pt reports that prior to sickness he was very active- attending online classes and working 3 part time jobs. Pt mother reports that pt was gaining wt PTA due to late  night eating out with friends (typically [place] food). She reports that they do not use salt at the table at home, but meals tend to be heavy in processed foods and simple carbohydrates.   Nutrition-Focused physical exam completed. Findings are no fat depletion, no muscle depletion, and moderate edema.   Pt mother is very interested in learning about the Heart Healthy diet that the pt is currently receiving. Educated pt on basic principles of Heart Healthy diet. Also provided additional handouts and RD contact information for pt mother to review, per her request.   Albumin has a half-life of 21 days and is strongly affected by stress response and inflammatory process, therefore, do not expect to see an improvement in this lab value during acute hospitalization.  When a patient presents with low albumin, it is likely skewed due to the acute inflammatory response.  Unless it is suspected that patient had poor PO intake or malnutrition prior to admission, then RD should not be consulted solely for low albumin. Note that low albumin is no longer used to diagnose malnutrition;  uses the new malnutrition guidelines published by the American Society for Parenteral and Enteral Nutrition (A.S.P.E.N.) and the Academy of Nutrition and Dietetics (AND).    Labs reviewed: Na: 133.   Diet Order:  Diet 2 gram sodium Room service appropriate?: Yes; Fluid consistency:: Thin; Fluid restriction:: 2000 mL Fluid  Skin:  Reviewed, no issues  Last BM:  09/21/15  Height:   Ht Readings from Last 1 Encounters:  09/20/15 6\' 2"  (1.88 m)    Weight:  Wt Readings from Last 1 Encounters:  09/22/15 304 lb 0.2 oz (137.9 kg)    Ideal Body Weight:  86.3 kg  BMI:  Body mass index is 39.02 kg/(m^2).  Estimated Nutritional Needs:   Kcal:  2000-2200  Protein:  100-115 grams   Fluid:  >2.0 L  EDUCATION NEEDS:   Education needs addressed  James Bennett, RD, LDN, CDE Pager: [phone] After hours  Pager: [phone]

## 2015-09-22 NOTE — Evaluation (Signed)
Occupational Therapy Evaluation Patient Details Name: James Bennett MRN: [id number] DOB: 08-Aug-1995 Today's Date: 09/22/2015    History of Present Illness Patient is a 20 y/o male admitted with SOB and cough x 2.5 months and recent LE swelling and bluish color of toes.  Demonstrated 15% EF on echo with possible viral myocarditis. Did develop respiratory failure requiring bipap temporarily.   Clinical Impression   PT admitted with SOB with progressive weakness over 2.5 months time. Pt currently with functional limitiations due to the deficits listed below (see OT problem list). PTA patient has had gradual decline with less participation in normal iadls over last 2 months. Pt at baseline independent with all adls.  Pt will benefit from skilled OT to increase their independence and safety with adls and balance to allow discharge HHOT and RW.  Energy conservation handout provided and educated on with mother present     Follow Up Recommendations  Home health OT    Equipment Recommendations  Other (comment) (RW- bariatric 304 pounds)    Recommendations for Other Services       Precautions / Restrictions Precautions Precautions: Fall Precaution Comments: watch HR      Mobility Bed Mobility               General bed mobility comments: in chair on arrival  Transfers Overall transfer level: Needs assistance Equipment used: Rolling walker (2 wheeled) Transfers: Sit to/from Stand Sit to Stand: Min guard         General transfer comment: x3 attempts with extra effort to complete task. Pt demonstrates fatigue from activity.     Balance                                            ADL Overall ADL's : Needs assistance/impaired                           Toilet Transfer Details (indicate cue type and reason): ambulated to stand commode and patient once in front of stand commode states "i cant get off that" Pt states i am goin to have to use a seat  like that one" Pt pointing to 3n1           General ADL Comments: Pt required x3 attempts with rocking momentum to complete sit<> stand from chair. Educated on chair push up, gluteus squeeze, shoulder press and holding bil UE against gravity for UB strengthening. Pt mother present throughout entire session and education. 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 pt currently requires use of RW. Patient asking when he can walk without RW but requires heavy use of BIL UE on RW this session     Vision     Perception     Praxis      Pertinent Vitals/Pain Pain Assessment: No/denies pain Faces Pain Scale: Hurts a little bit Pain Location: broken bilster on foot Pain Descriptors / Indicators: Sore Pain Intervention(s): Monitored during session     Hand Dominance Right   Extremity/Trunk Assessment Upper Extremity Assessment Upper Extremity Assessment: Overall WFL for tasks assessed   Lower Extremity Assessment Lower Extremity Assessment: Defer to PT evaluation  Cervical / Trunk Assessment Cervical / Trunk Assessment: Normal   Communication Communication Communication: No difficulties   Cognition Arousal/Alertness: Awake/alert Behavior During Therapy: WFL for tasks assessed/performed Overall Cognitive Status: Within Functional Limits for tasks assessed                     General Comments       Exercises       Shoulder Instructions      Home Living Family/patient expects to be discharged to:: Private residence Living Arrangements: Parent Available Help at Discharge: Family;Available 24 hours/day Type of Home: House Home Access: Stairs to enter [employer] of Steps: 1 Entrance Stairs-Rails: None Home Layout: One level     Bathroom Shower/Tub: Film/video editor: Standard     Home Equipment: Shower seat;Bedside commode   Additional Comments: DME from brother with similar height / build accident      Prior Functioning/Environment Level of Independence: Independent        Comments: 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, 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, taking online classes    OT Diagnosis: Generalized weakness   OT Problem List: Decreased strength;Decreased activity tolerance;Impaired balance (sitting and/or standing);Decreased safety awareness;Decreased knowledge of use of DME or AE;Decreased knowledge of precautions;Cardiopulmonary status limiting activity   OT Treatment/Interventions: Self-care/ADL training;Therapeutic exercise;DME and/or AE instruction;Therapeutic activities;Patient/family education;Balance training    OT Goals(Current goals can be found in the care plan section) Acute Rehab OT Goals Patient Stated Goal: To go home OT Goal Formulation: With patient/family Time For Goal Achievement: 10/06/15 Potential to Achieve Goals: Good  OT Frequency: Min 2X/week   Barriers to D/C:            Co-evaluation              End of Session Equipment Utilized During Treatment: Gait belt;Rolling walker Nurse Communication: Mobility status;Precautions  Activity Tolerance: Patient tolerated treatment well Patient left: in chair;with call bell/phone within reach;with family/visitor present   Time: 1205-1249 OT Time Calculation (min): 44 min Charges:  OT General Charges $OT Visit: 1 Procedure OT Evaluation $OT Eval High Complexity: 1 Procedure OT Treatments $Self Care/Home Management : 8-22 mins G-Codes:    Harolyn Rutherford 2015-10-06, 2:00 PM  Mateo Flow   OTR/L Pager: [phone] Office: [phone] .

## 2015-09-22 NOTE — Consult Note (Signed)
Referring MD: Dr Marca Ancona  PCP:  No primary care provider on file.   Reason for Referral:  Hematology opinion on ischemic extremities and unexplained upper extremity thrombosis/transient fall in platelet count  Chief Complaint  Patient presents with  . Foot Swelling    HPI: 19 year old man who has been in excellent health with no major medical or surgical illness, on no chronic medication, no allergies, whose medical problems began around [place] when he initially developed nausea and vomiting and then a persistent, nonproductive cough. No fever. No diarrhea. No arthralgias. No myalgias. No headache. No stiff neck. No change in vision. He did notice a diffuse, pruritic, skin rash. 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 No travel. He had 3 visits to local urgent care center in [facility] for persistent cough. He was put on a number of oral antibiotics. Over the last few weeks, he noted increasing swelling of his lower extremities but denied any significant dyspnea. Feet were so swollen he could not wear his usual shoes. He then noted that his toes on both feet were turning blue about 3-4 days prior to presenting to the hospital for further evaluation. He was admitted on February 20. Initial chest x-ray read as multi lobar pneumonia right upper, right lower, and lingular. Mild cardiomegaly. CT scan of the chest without contrast confirmed bilateral patchy infiltrates most prominent in the lower lungs consistent with bilateral pneumonia. Trace ascites around the liver and spleen with otherwise normal appearance of these organs. ABIs were normal bilaterally.  Laboratory studies showed a significant elevation of BNP at 1313 units. Echocardiogram showed a severe dilated cardiomyopathy with estimated ejection fraction of 15%. Initial cardiogram with sinus tachycardia. He had to be started on milrinone in an attempt to  maintain his cardiac output. Initial lab profile with white count 20,000, 85% neutrophils, 6 lymphocytes, 9 monocytes, 0 eosinophils, hemoglobin 14, hematocrit 43%, platelets  clumped. Repeat specimen platelets 162,000. Platelets drifted down over the next 48 hours to a nadir of 120,000 and then rebounded up to today's value of 306,000. He was initially put on unfractionated heparin which was discontinued when platelet count fell and he developed a right upper extremity thrombosis. Patient reports to me that his right upper extremity was swollen for a few days before he came into the hospital.  He has evidence of significant hepatic synthetic dysfunction with initial elevation of bilirubin, prothrombin time, and significant decreased albumin. Acute hepatitis profile is negative for hepatitis A, B, and C. Negative HIV. Negative IgM against CMV. Negative influenza. Coxsackie virus titers and titers against EBV are pending. Urinary Legionella antigen negative. He has normal renal function.  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 Extensive infectious and serologic testing has been done and is unrevealing so far as summarized above. In view of the unexplained ischemic extremities, cryoglobulin screen was done and cryoglobulins were not detected. Vasculitis screen negative with normal complement levels, negative ANA, negative CCP, negative anti-Smith, anti-DNA, anti-J0, antibodies. Negative rheumatoid factor. Antiphospholipid antibody panel and lupus anticoagulant testing negative.  (beta-2 glycoprotein 1 antibodies not checked.) Urinalysis with trace protein, positive bilirubin, negative blood, no RBCs, positive presence of granular casts. Streptococcal urinary antigen negative. Legionella urinary antigen negative.  LDH is moderately elevated but this is consistent with tissue ischemia of his feet.    Past Medical History  Diagnosis Date  . Bronchitis   :  Past Surgical History  Procedure Laterality  Date  . Cardiac catheterization N/A 09/17/2015    Procedure: Right Heart Cath;  Surgeon: Laurey Morale, MD;  Location: [facility] INVASIVE CV LAB;  Service: Cardiovascular;  Laterality: N/A;  :  . antiseptic oral rinse  7 mL Mouth Rinse BID  . citalopram  10 mg Oral Daily  . digoxin  0.25 mg Oral Daily  . famotidine  20 mg Oral BID  . furosemide  80 mg Intravenous TID AC  . ivabradine  5 mg Oral BID WC  . lisinopril  7.5 mg Oral BID  . magnesium oxide  400 mg Oral BID  . polyethylene glycol  17 g Oral Daily  . potassium chloride  40 mEq Oral Daily  . sodium chloride flush  10-40 mL Intracatheter Q12H  . spironolactone  25 mg Oral Daily  . warfarin  7.5 mg Oral ONCE-1800  . Warfarin - Pharmacist Dosing Inpatient   Does not apply q1800  :  No Known Allergies:  Family History  Problem Relation Age of Onset  . Lupus Mother   : Both parents are healthy. A 16 year old brother is healthy.   Social History   Social History  . Marital Status: Single    Spouse Name: N/A  . Number of Children: N/A  . Years of Education: N/A   Occupational History  . Not on file.   Social History Main Topics  . Smoking status: Never Smoker   . Smokeless tobacco: Not on file  . Alcohol Use: No  . Drug Use: No  . Sexual Activity: Not on file   Other Topics Concern  . 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 Bird exposure through his father's workplace of a small parrot. No mold exposure. No recent travel.  :  ROS: See history of present illness. Review of systems otherwise unremarkable.   Vitals: Filed Vitals:   09/22/15 1112 09/22/15 1115  BP:  118/66  Pulse:    Temp: 98.3 F (36.8 C)   Resp:  30    PHYSICAL EXAM: Blood pressure 118/66, pulse 117, temperature 98.3 F (36.8 C), temperature source Oral, resp. rate 30, height 6\' 2"  (1.88 m), weight 304 lb 0.2 oz (137.9 kg), SpO2 98 %. General  appearance: Overweight Caucasian man in no acute distress HEENT: Head is atraumatic. Pharynx no erythema or exudate or mass. Lymph Nodes: No cervical, supraclavicular, or axillary adenopathy Resp: Lungs are clear to auscultation and resonant to percussion throughout Cardio: He has a very short thick neck. I could not appreciate jugular venous distention. Rapid regular cardiac rhythm with positive S3 gallop. No murmur. No rub. 2+ pedal edema to midcalf. Vascular: No carotid bruits. Unable to palpate dorsalis pedis pulses secondary to pedal edema Breasts: GI: Abdomen soft obese nontender no obvious mass or organomegaly GU: Foley catheter in place Extremities: Ischemic gangrenous changes of all of the toes on both of his feet but no proximal ischemic changes on his hands. Neurologic: He is alert, oriented 3, pupils equal round and reactive, full extraocular movements, cranial nerves otherwise grossly normal, motor strength 5 over 5 all extremities, reflexes 1+ symmetric, upper body coordination normal Skin: No rash or ecchymosis. There are scratch marks on his thorax were he had a previous pruritic rash.  Labs:   Recent Labs  09/21/15 1641 09/22/15 0430  WBC 19.6* 17.4*  HGB 11.1* 10.2*  HCT 35.4* 32.5*  PLT  307 306    Recent Labs  09/21/15 1700 09/22/15 0430  NA 133* 133*  K 3.7 3.9  CL 93* 96*  CO2 29 29  GLUCOSE 97 96  BUN 18 15  CREATININE 0.96 0.74  CALCIUM 8.1* 8.1*    Blood smear review: pending  Images Studies/Results: See discussion above. I cannot find the report of the vascular Doppler study of the upper extremity done yesterday.  Impression: Complex situation. Initial multilobar pneumonia with a viral prodrome in an otherwise immunocompetent person. Now with an advanced dilated cardiomyopathy. Given extensive evaluation above, no evidence that he has a systemic vasculitis. No constitutional symptoms. Preserved renal function. No active renal sediment. No  significant proteinuria on initial urinalysis. I think the ischemic changes of his toes may be from low cardiac output. Normal ABIs on admission.   If anybody has a systemic inflammatory response it is this person. This together with release of thromboplastic substances  from ischemic feet may have triggered his upper extremity thrombosis. This is not the picture of a congenital coagulopathy and I would not do an extensive panel of lab testing in that regard.  I think there is a very low pretest probability that this gentleman has developed heparin-induced thrombocytopenia with thrombosis. By his history, his right upper extremity was swollen prior to admission. Likely previous thrombosis and not an effect of heparin. Only a minor dip in his platelet count and he is on multiple other drugs including milrinone which I have seen cause thrombocytopenia and other patients. Short time course without previous heparin sensitization also argues against heparin-induced thrombocytopenia.   Recommendation: I feel that this man should be able to receive heparin if it is necessary for him to go on a ventricular assist device.  I am not sure that I have anything else to add to his evaluation that has not already been thought of. I am going to go ahead and order a serum protein electrophoresis and immunoelectrophoresis as a screen for his immune competence and I will review his peripheral blood film. Significant decrease in hepatic synthetic function is bothersome and I would not usually expect to see this with a viral infection with disproportionately low transaminase enzyme elevations, a negative hepatitis A, B, C, and CMV panel. I would consider doing a liver biopsy to look for viral cytopathic effect since it is more difficult to do a endomyocardial biopsy.    Genene Churn [facility] 09/22/2015, 12:54 PM

## 2015-09-22 NOTE — Progress Notes (Signed)
Physical Therapy Treatment Patient Details Name: James Bennett MRN: [id number] DOB: [birth year] Today's Date: 09/22/2015    History of Present Illness Patient is a 20 y/o male admitted with SOB and cough x 2.5 months and recent LE swelling and bluish color of toes.  Demonstrated 15% EF on echo with possible viral myocarditis. Did develop respiratory failure requiring bipap temporarily.    PT Comments    Patient progressing with mobility and tolerating increased distance with VSS and no overt c/o pain.  Able to participate in standing balance activies without UE support.  Feel continued skilled PT indicated to continue to progress and encouraged pt and family to contact nursing for more walks while still hooked up to multiple lines.  Follow Up Recommendations  Home health PT;Supervision/Assistance - 24 hour     Equipment Recommendations  Rolling walker with 5" wheels;3in1 (PT) (bariatric)    Recommendations for Other Services Other (comment) (cardiac rehab)     Precautions / Restrictions Precautions Precautions: Fall Precaution Comments: watch HR    Mobility  Bed Mobility Overal bed mobility: Needs Assistance Bed Mobility: Sit to Supine     Supine to sit: Min assist;Mod assist     General bed mobility comments: assist to lift trunk and bring legs off bed  Transfers Overall transfer level: Needs assistance Equipment used: Rolling walker (2 wheeled) Transfers: Sit to/from Stand Sit to Stand: Min assist;Min guard         General transfer comment: cues for hand placement and foot position; elevated bed height for improved biomechanics, but up from low recliner with pillows x 5 reps   Ambulation/Gait Ambulation/Gait assistance: Min assist;Min guard;+2 safety/equipment Ambulation Distance (Feet): 200 Feet Assistive device: Rolling walker (2 wheeled) Gait Pattern/deviations: Step-through pattern;Trunk flexed     General Gait Details: no signs of dyspnea, HR max 123.   flexed onto walker   Stairs            Wheelchair Mobility    Modified Rankin (Stroke Patients Only)       Balance             Standing balance-Leahy Scale: Good Standing balance comment: minguard for alternate step taps forward, standing static no UE support, head turns and nods x 10 each no lob                    Cognition Arousal/Alertness: Awake/alert Behavior During Therapy: WFL for tasks assessed/performed Overall Cognitive Status: Within Functional Limits for tasks assessed                      Exercises Other Exercises Other Exercises: sit to stand x 5 reps    General Comments General comments (skin integrity, edema, etc.): RA , HR 111 during session. Educated with handout on EC and reviewed in detail. Mothers setting up home based on education      Pertinent Vitals/Pain Pain Assessment: No/denies pain Faces Pain Scale: Hurts a little bit Pain Location: broken bilster on foot Pain Descriptors / Indicators: Sore Pain Intervention(s): Monitored during session    Home Living Family/patient expects to be discharged to:: Private residence Living Arrangements: Parent Available Help at Discharge: Family;Available 24 hours/day Type of Home: House Home Access: Stairs to enter Entrance Stairs-Rails: None Home Layout: One level Home Equipment: Shower seat;Bedside commode Additional Comments: DME from brother with similar height / build accident    Prior Function Level of Independence: Independent      Comments: 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, 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, taking online classes   PT Goals (current goals can now be found in the care plan section) Acute Rehab PT Goals Patient Stated Goal: To go home Progress towards PT goals: Progressing toward goals    Frequency  Min 3X/week    PT Plan Current plan remains appropriate    Co-evaluation             End of Session  Equipment Utilized During Treatment: Gait belt Activity Tolerance: Patient tolerated treatment well Patient left: in chair;with call bell/phone within reach     Time: 1115-1142 PT Time Calculation (min) (ACUTE ONLY): 27 min  Charges:  $Gait Training: 8-22 mins $Therapeutic Activity: 8-22 mins                    G Codes:      [name] 2015/10/04, 2:04 PM  Sheran Lawless, PT [phone] 2015-10-04

## 2015-09-22 NOTE — Progress Notes (Signed)
ANTICOAGULATION CONSULT NOTE - Follow Up Consult  Pharmacy Consult for heparin Indication: DVT  Labs:  Recent Labs  09/19/15 0520 09/20/15 0530 09/20/15 2240 09/21/15 0530 09/21/15 1230 09/21/15 1641 09/21/15 1700 09/21/15 2000 09/22/15 0015  HGB 11.7* 10.8*  --   --   --  11.1*  --   --   --   HCT 38.0* 33.4*  --   --   --  35.4*  --   --   --   PLT 156 163  --   --   --  307  --   --   --   APTT  --   --   --   --   --   --   --  52* 58*  LABPROT 18.0* 16.1*  --  15.3*  --   --  16.1*  --   --   INR 1.48 1.28  --  1.19  --   --  1.28  --   --   HEPARINUNFRC 0.47 0.35  --  0.17* 0.15*  --   --   --   --   CREATININE 0.85 0.89 1.01 0.84  --   --  0.96  --   --      Assessment/Plan:  19yo male therapeutic on Angiomax after transitioning from heparin. Will continue gtt at current rate and confirm stable with am labs.   Vernard Gambles, PharmD, BCPS  09/22/2015,1:11 AM

## 2015-09-22 NOTE — Progress Notes (Signed)
ANTICOAGULATION CONSULT NOTE - Follow Up Consult  Pharmacy Consult for Angiomax Indication: DVT on IV heparin  No Known Allergies  Patient Measurements: Height: 6\' 2"  (188 cm) Weight: (!) 304 lb 0.2 oz (137.9 kg) IBW/kg (Calculated) : 82.2  Vital Signs: Temp: 98.3 F (36.8 C) (02/28 1112) Temp Source: Oral (02/28 1112) BP: 118/66 mmHg (02/28 1115) Pulse Rate: 117 (02/28 0900)  Labs:  Recent Labs  09/20/15 0530  09/21/15 0530 09/21/15 1230 09/21/15 1641 09/21/15 1700 09/21/15 2000 09/22/15 0015 09/22/15 0430  HGB 10.8*  --   --   --  11.1*  --   --   --  10.2*  HCT 33.4*  --   --   --  35.4*  --   --   --  32.5*  PLT 163  --   --   --  307  --   --   --  306  APTT  --   --   --   --   --   --  52* 58* 56*  LABPROT 16.1*  --  15.3*  --   --  16.1*  --   --   --   INR 1.28  --  1.19  --   --  1.28  --   --   --   HEPARINUNFRC 0.35  --  0.17* 0.15*  --   --   --   --   --   CREATININE 0.89  < > 0.84  --   --  0.96  --   --  0.74  < > = values in this interval not displayed.  Estimated Creatinine Clearance: 217.7 mL/min (by C-G formula based on Cr of 0.74).   Assessment: 20 yo m with no PMH admitted on 2/20 with bilateral LE swelling x 1 week and SOB x 2.5 months - treated for bronchitis/pna and viral illness as oupt.Found to have an EF of 15% and newly diagnosed cardiomyopathy. He has been on IV heparin for a possible embolic event d/t cyanotic toes and feet. Initial LE doppler negative for DVT, no VQ or CT scan but O2 sat 98% RA, and ECHO neg for clot. Now upper extremity doppler is positive for acute thrombus in right Internal jugular vein and subclavian vein. Right cephalic vein superificial vein thrombus. Pharmacy is consulted to change heparin to bivalirudin. Bivalirudin 0.15mg /kg/hr (wt140kg) all aPTTs have been in range 52, 58, 56 sec - continue current rate. LFTs and INR elevated on admit probably from poor CO and liver congestion > improved now  Plan to begin  warfarin today 2/28 INR 1.19 prior to bivalirudin started - which falsely elevates INR - when INR about 4 will hold bivalirudin and recheck INR to ensure at goal  Goal of Therapy:  APTT = 50-85    INR 2-3 Monitor platelets by anticoagulation protocol: Yes   Plan:   Continue Bivalirudin 0.15mg /kg/hr = 42.1 ml/hr Warfarin 7.5mg  x1 Daily CBC, aPTT, INR  Leota Sauers Pharm.D. CPP, BCPS Clinical Pharmacist [phone] 09/22/2015 11:59 AM

## 2015-09-23 DIAGNOSIS — K7689 Other specified diseases of liver: Secondary | ICD-10-CM

## 2015-09-23 DIAGNOSIS — I82621 Acute embolism and thrombosis of deep veins of right upper extremity: Secondary | ICD-10-CM

## 2015-09-23 DIAGNOSIS — I82409 Acute embolism and thrombosis of unspecified deep veins of unspecified lower extremity: Secondary | ICD-10-CM | POA: Insufficient documentation

## 2015-09-23 DIAGNOSIS — I998 Other disorder of circulatory system: Secondary | ICD-10-CM

## 2015-09-23 LAB — PROTEIN ELECTROPHORESIS, SERUM
A/G Ratio: 0.8 (ref 0.7–1.7)
ALBUMIN ELP: 1.9 g/dL — AB (ref 2.9–4.4)
ALPHA-1-GLOBULIN: 0.3 g/dL (ref 0.0–0.4)
ALPHA-2-GLOBULIN: 0.4 g/dL (ref 0.4–1.0)
Beta Globulin: 0.8 g/dL (ref 0.7–1.3)
Gamma Globulin: 0.7 g/dL (ref 0.4–1.8)
Globulin, Total: 2.3 g/dL (ref 2.2–3.9)
TOTAL PROTEIN ELP: 4.2 g/dL — AB (ref 6.0–8.5)

## 2015-09-23 LAB — CBC
HEMATOCRIT: 32.7 % — AB (ref 39.0–52.0)
Hemoglobin: 10.1 g/dL — ABNORMAL LOW (ref 13.0–17.0)
MCH: 25.3 pg — ABNORMAL LOW (ref 26.0–34.0)
MCHC: 30.9 g/dL (ref 30.0–36.0)
MCV: 81.8 fL (ref 78.0–100.0)
Platelets: 382 10*3/uL (ref 150–400)
RBC: 4 MIL/uL — ABNORMAL LOW (ref 4.22–5.81)
RDW: 20.9 % — AB (ref 11.5–15.5)
WBC: 17 10*3/uL — ABNORMAL HIGH (ref 4.0–10.5)

## 2015-09-23 LAB — BASIC METABOLIC PANEL
Anion gap: 11 (ref 5–15)
BUN: 13 mg/dL (ref 6–20)
CO2: 28 mmol/L (ref 22–32)
Calcium: 8.6 mg/dL — ABNORMAL LOW (ref 8.9–10.3)
Chloride: 95 mmol/L — ABNORMAL LOW (ref 101–111)
Creatinine, Ser: 0.69 mg/dL (ref 0.61–1.24)
GFR calc Af Amer: >60 mL/min (ref >60)
GLUCOSE: 94 mg/dL (ref 65–99)
POTASSIUM: 4.1 mmol/L (ref 3.5–5.1)
Sodium: 134 mmol/L — ABNORMAL LOW (ref 135–145)

## 2015-09-23 LAB — CARBOXYHEMOGLOBIN
CARBOXYHEMOGLOBIN: 1.8 % — AB (ref 0.5–1.5)
Carboxyhemoglobin: 2.2 % — ABNORMAL HIGH (ref 0.5–1.5)
METHEMOGLOBIN: 0.9 % (ref 0.0–1.5)
Methemoglobin: 0.8 % (ref 0.0–1.5)
O2 SAT: 60.6 %
O2 Saturation: 64.8 %
TOTAL HEMOGLOBIN: 10.4 g/dL — AB (ref 13.5–18.0)
Total hemoglobin: 11 g/dL — ABNORMAL LOW (ref 13.5–18.0)

## 2015-09-23 LAB — IMMUNOFIXATION ELECTROPHORESIS
IGA: 216 mg/dL (ref 90–386)
IGG (IMMUNOGLOBIN G), SERUM: 861 mg/dL (ref 700–1600)
IGM, SERUM: 95 mg/dL (ref 20–172)
Total Protein ELP: 5 g/dL — ABNORMAL LOW (ref 6.0–8.5)

## 2015-09-23 LAB — MAGNESIUM: Magnesium: 1.8 mg/dL (ref 1.7–2.4)

## 2015-09-23 LAB — PROTIME-INR
INR: 1.54 — AB (ref 0.00–1.49)
Prothrombin Time: 18.6 seconds — ABNORMAL HIGH (ref 11.6–15.2)

## 2015-09-23 LAB — CRYOGLOBULIN

## 2015-09-23 LAB — APTT: APTT: 57 s — AB (ref 24–37)

## 2015-09-23 MED ORDER — LISINOPRIL 10 MG PO TABS
10.0000 mg | ORAL_TABLET | Freq: Two times a day (BID) | ORAL | Status: DC
Start: 2015-09-23 — End: 2015-09-25
  Administered 2015-09-23 – 2015-09-24 (×3): 10 mg via ORAL
  Filled 2015-09-23 (×5): qty 1

## 2015-09-23 MED ORDER — MILRINONE IN DEXTROSE 20 MG/100ML IV SOLN
0.2500 ug/kg/min | INTRAVENOUS | Status: DC
  Administered 2015-09-23 (×2): 0.25 ug/kg/min via INTRAVENOUS
  Administered 2015-09-24: 0.125 ug/kg/min via INTRAVENOUS
  Administered 2015-09-25 – 2015-09-28 (×9): 0.25 ug/kg/min via INTRAVENOUS
  Administered 2015-09-29: 0.236 ug/kg/min via INTRAVENOUS
  Administered 2015-09-29 – 2015-10-01 (×4): 0.25 ug/kg/min via INTRAVENOUS
  Filled 2015-09-23 (×18): qty 100

## 2015-09-23 MED ORDER — WARFARIN SODIUM 2.5 MG PO TABS
2.5000 mg | ORAL_TABLET | Freq: Once | ORAL | Status: AC
  Administered 2015-09-23: 2.5 mg via ORAL
  Filled 2015-09-23: qty 1

## 2015-09-23 MED ORDER — TORSEMIDE 20 MG PO TABS: 40.0000 mg | ORAL_TABLET | Freq: Every day | ORAL | Status: DC

## 2015-09-23 MED ORDER — MAGNESIUM SULFATE 2 GM/50ML IV SOLN
2.0000 g | Freq: Once | INTRAVENOUS | Status: AC
  Administered 2015-09-23: 2 g via INTRAVENOUS
  Filled 2015-09-23: qty 50

## 2015-09-23 MED ORDER — POLYETHYLENE GLYCOL 3350 17 G PO PACK
17.0000 g | PACK | Freq: Every day | ORAL | Status: DC | PRN
Start: 2015-09-23 — End: 2015-10-01

## 2015-09-23 NOTE — Progress Notes (Signed)
[facility]  Patient Status: New pt with [facility] this admission  [facility] is providing the following services: [facility] HHRN and [facility] team for home Milrinone if needed at DC.  Meet with pt and his parents tonight to review POC for home Milrinone and answer questions.  [facility] will be prepared for DC home on Milrinone if needed when ordered.  [facility] hospital team will follow [facility] until DC to support transition home.   If patient discharges after hours, please call [phone].   Sedalia Muta 09/23/2015, 10:17 PM

## 2015-09-23 NOTE — Progress Notes (Addendum)
Patient ID: Famous Speller, male   DOB: 1995-08-22, 20 y.o.   MRN: [id number] Hematology: Peripheral blood film unremarkable. No suggestion of a vascultic/micro-angiopathic process. Tachycardic. Persistent S3 gallop. Now that edema less, bilateral DP pulses easily palpable. Progressive ischemic/gangrenous changes of toes. 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 Serotonin release assay pending to confirm. Data collected on 23 [facility] patients over the last year suggests [facility] cutoff is too low. We have never seen a positive Serotonin release test unless OD >/= 1.0  However, I would proceed with caution pending confirmatory testing. Coumadin should be started at a low dose 2.5 mg daily and slowly titrated to avoid warfarin induced coagulopathy. Of note, patient also has significant liver dysfunction. High IgG but not IgM titers vs [place] suggestive but not diagnostic  That this is the pathogen responsible for his myocarditis. Patient's mother here. Status reviewed.  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 Acute RUE DVT: per pt history, arm swollen prior to admission #4. Transient thrombocytopenia - resolved #5. 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 Hepatic dysfunction viral cytopathic vs passive congestion from low cardiac output #7. Ischemic toes - likely from low cardiac output

## 2015-09-23 NOTE — Progress Notes (Signed)
ANTICOAGULATION CONSULT NOTE - Follow Up Consult  Pharmacy Consult for Angiomax and warfarin Indication: DVT on IV heparin  No Known Allergies  Patient Measurements: Height: 6\' 2"  (188 cm) Weight: 283 lb 11.7 oz (128.7 kg) IBW/kg (Calculated) : 82.2  Vital Signs: Temp: 98.2 F (36.8 C) (03/01 0800) Temp Source: Oral (03/01 0800) BP: 121/59 mmHg (03/01 0814) Pulse Rate: 129 (03/01 0841)  Labs:  Recent Labs  09/21/15 0530 09/21/15 1230  09/21/15 1641 09/21/15 1700  09/22/15 0015 09/22/15 0430 09/23/15 0430  HGB  --   --   < > 11.1*  --   --   --  10.2* 10.1*  HCT  --   --   --  35.4*  --   --   --  32.5* 32.7*  PLT  --   --   --  307  --   --   --  306 382  APTT  --   --   --   --   --   < > 58* 56* 57*  LABPROT 15.3*  --   --   --  16.1*  --   --   --  18.6*  INR 1.19  --   --   --  1.28  --   --   --  1.54*  HEPARINUNFRC 0.17* 0.15*  --   --   --   --   --   --   --   CREATININE 0.84  --   --   --  0.96  --   --  0.74 0.69  < > = values in this interval not displayed.  Estimated Creatinine Clearance: 210 mL/min (by C-G formula based on Cr of 0.69).   Assessment: 20 yo m with no PMH admitted on 2/20 with bilateral LE swelling x 1 week and SOB x 2.5 months - treated for bronchitis/pna and viral illness as oupt.Found to have an EF of 15% and newly diagnosed cardiomyopathy. He has been on IV heparin for a possible embolic event d/t cyanotic toes and feet. Initial LE doppler negative for DVT, no VQ or CT scan but O2 sat 98% RA, and ECHO neg for clot. Now upper extremity doppler is positive for acute thrombus in right Internal jugular vein and subclavian vein. Right cephalic vein superificial vein thrombus. Pharmacy is consulted to dose bivalirudin and warfarin.  Bivalirudin 0.15mg /kg/hr (wt140kg) all aPTTs have been in range 52, 58, 56 sec LFTs and INR elevated on admit probably from poor CO and liver congestion > improved now  INR 1.19 prior to bivalirudin started -  which falsely elevates INR - INR reached 1.28 on bivalirudin After warfarin 7.5mg  INR now 1.54. Per hematology, would not like to exceed a dose > 2.5mg  due to possibility of inducing coagulopathy.  Goal of Therapy:  APTT = 50-85    INR 2-3 Monitor platelets by anticoagulation protocol: Yes   Plan:  1) Continue Bivalirudin 0.15mg /kg/hr = 42.1 ml/hr 2) Warfarin 2.5mg  x1 3) Daily CBC, aPTT, INR   [name], PharmD Clinical Pharmacy Resident Pager: [phone] 09/23/2015 11:59 AM

## 2015-09-23 NOTE — Progress Notes (Signed)
Subjective  -   Denies pain in his feet Wants to walk   Physical Exam:  Toes continue to demarcate Blisters now open       Assessment/Plan:   There has been a slight improvement in the ischemic changes to his toes.  There are no signs of infection.  I discussed with the patient and his mother, that we will continue to monitor his toes, with no immediate plans for amputation.  He can ambulate, but I told him to minimize pressure to the toes, i/e walk on the heels and balls of his feet.  Durene Cal 09/23/2015 8:31 PM --  Filed Vitals:   09/23/15 1500 09/23/15 1940  BP:  91/67  Pulse:    Temp: 97.8 F (36.6 C) 99.7 F (37.6 C)  Resp:  29    Intake/Output Summary (Last 24 hours) at 09/23/15 2031 Last data filed at 09/23/15 1600  Gross per 24 hour  Intake 1332.7 ml  Output   5200 ml  Net -3867.3 ml     Laboratory CBC    Component Value Date/Time   WBC 17.0* 09/23/2015 0430   HGB 10.1* 09/23/2015 0430   HCT 32.7* 09/23/2015 0430   PLT 382 09/23/2015 0430    BMET    Component Value Date/Time   NA 134* 09/23/2015 0430   K 4.1 09/23/2015 0430   CL 95* 09/23/2015 0430   CO2 28 09/23/2015 0430   GLUCOSE 94 09/23/2015 0430   BUN 13 09/23/2015 0430   CREATININE 0.69 09/23/2015 0430   CALCIUM 8.6* 09/23/2015 0430   GFRNONAA >60 09/23/2015 0430   GFRAA >60 09/23/2015 0430    COAG Lab Results  Component Value Date   INR 1.54* 09/23/2015   INR 1.28 09/21/2015   INR 1.19 09/21/2015   No results found for: PTT  Antibiotics Anti-infectives    Start     Dose/Rate Route Frequency Ordered Stop   09/19/15 0007  vancomycin (VANCOCIN) 1,500 mg in sodium chloride 0.9 % 500 mL IVPB  Status:  Discontinued     1,500 mg 250 mL/hr over 120 Minutes Intravenous Every 12 hours 09/18/15 1325 09/21/15 0818   09/15/15 0800  vancomycin (VANCOCIN) 1,500 mg in sodium chloride 0.9 % 500 mL IVPB  Status:  Discontinued     1,500 mg 250 mL/hr over 120 Minutes  Intravenous Every 8 hours 09/14/15 1816 09/18/15 1325   09/15/15 0100  piperacillin-tazobactam (ZOSYN) IVPB 3.375 g  Status:  Discontinued     3.375 g 12.5 mL/hr over 240 Minutes Intravenous Every 8 hours 09/14/15 1650 09/21/15 0818   09/14/15 1907  vancomycin (VANCOCIN) 500 MG powder    Comments:  Tommi Rumps   : cabinet override      09/14/15 1907 09/14/15 1910   09/14/15 1900  vancomycin (VANCOCIN) 500 mg in sodium chloride 0.9 % 100 mL IVPB  Status:  Discontinued     500 mg 100 mL/hr over 60 Minutes Intravenous  Once 09/14/15 1714 09/18/15 1325   09/14/15 1800  vancomycin (VANCOCIN) 1,500 mg in sodium chloride 0.9 % 500 mL IVPB  Status:  Discontinued     1,500 mg 250 mL/hr over 120 Minutes Intravenous  Once 09/14/15 1647 09/14/15 1713   09/14/15 1800  vancomycin (VANCOCIN) IVPB 1000 mg/200 mL premix     1,000 mg 200 mL/hr over 60 Minutes Intravenous  Once 09/14/15 1714 09/14/15 1909   09/14/15 1700  vancomycin (VANCOCIN) IVPB 1000 mg/200 mL premix  1,000 mg 200 mL/hr over 60 Minutes Intravenous  Once 09/14/15 1647 09/14/15 1812   09/14/15 1630  piperacillin-tazobactam (ZOSYN) IVPB 3.375 g     3.375 g 100 mL/hr over 30 Minutes Intravenous  Once 09/14/15 1620 09/14/15 1707   09/14/15 1630  vancomycin (VANCOCIN) IVPB 1000 mg/200 mL premix  Status:  Discontinued     1,000 mg 200 mL/hr over 60 Minutes Intravenous  Once 09/14/15 1620 09/14/15 1646       V. Charlena Cross, M.D. [facility] Office: [phone] Pager:  [phone]

## 2015-09-23 NOTE — Progress Notes (Signed)
CSW referred to introduce self and explore readiness for VAD assessment with patient and mother as possible VAD candidate in the future. CSW met at bedside with both patient and mother and discussed CSW role in AHF clinic and VAD program. Patient and mother both shared positive thoughts for recovery and hope to not have to pursue LVAD. CSW offered support and will follow in the outpatient HF clinic and explore readiness for VAD evaluation. Patient and mother do not appear not ready to complete VAD assessment at this time. CSW provided card and will follow up through HF clinic. Raquel Sarna, [place]

## 2015-09-23 NOTE — Progress Notes (Signed)
CARDIAC REHAB PHASE I   PRE:  Rate/Rhythm: 112 ST    BP: sitting 113/58    SaO2: 100 RA  MODE:  Ambulation: 430 ft   POST:  Rate/Rhythm: 132 ST    BP: sitting 109/65     SaO2: 97 RA  Pt eager to walk. Difficult for him to stand due to sore/stiffness and long legs in short recliner. RW needed when he stood for support. Steady walking with RW. HR up to 132 ST. Denied SOB or dizziness. Sts his feet didn't hurt with walking and knew to walk on his heels. Return to recliner. Will do ed with pt and mother tomorrow. [id number]   Harriet Masson CES, ACSM 09/23/2015 3:17 PM

## 2015-09-23 NOTE — Progress Notes (Signed)
Patient ID: James Bennett, male   DOB: May 05, 1996, 20 y.o.   MRN: [id number]    Advanced Heart Failure Rounding Note   Subjective:    Milrinone restarted on 2/25 due to low co-ox (30-40s). Co-ox today 60% on milrinone 0.375. Weight coming down. RUE edema. Had ultrasound + DVT on 2/27. Heparin stopped (had been hard to keep PTT therapeutic) and switched to Bivalirudin.   Yesterday lisinopril increased to 7.5 mg twice a day. Denies SOB/Orthopnea.    ECHO 09/15/2015 EF 15% IVC dilated RV mildly decreased.   [facility] Procedural Findings (2/23): Hemodynamics (mmHg) RA mean 19 RV 54/19 PA 51/32, mean 45 PCWP mean 40 Oxygen saturations: PA 57% AO 96% Cardiac Output (Fick) 6.02  Cardiac Index (Fick) 2.24 Cardiac Output (Thermo) 6.88 Cardiac Index (Thermo) 2.56  Objective:   Weight Range:  Vital Signs:   Temp:  [98.3 F (36.8 C)-99.9 F (37.7 C)] 98.8 F (37.1 C) (03/01 0351) Pulse Rate:  [110-117] 110 (02/28 1955) Resp:  [18-30] 18 (03/01 0355) BP: (103-118)/(57-66) 113/65 mmHg (03/01 0355) SpO2:  [97 %-98 %] 97 % (03/01 0351) Weight:  [283 lb 11.7 oz (128.7 kg)] 283 lb 11.7 oz (128.7 kg) (03/01 0404) Last BM Date: 09/22/15  Weight change: Filed Weights   09/21/15 1554 09/22/15 0500 09/23/15 0404  Weight: 309 lb 8 oz (140.388 kg) 304 lb 0.2 oz (137.9 kg) 283 lb 11.7 oz (128.7 kg)    Intake/Output:   Intake/Output Summary (Last 24 hours) at 09/23/15 0738 Last data filed at 09/23/15 0600  Gross per 24 hour  Intake 2839.3 ml  Output   5875 ml  Net -3035.7 ml     Physical Exam: CVP 11 General:  Sitting on side of bed.  HEENT: normal. LIJ  Neck: Thick. JVP 10. Carotids 2+ bilat; no bruits. No lymphadenopathy or thryomegaly appreciated. Cor: PMI laterally displaced.  Tachy, regular rate & rhythm. +S3.  No murmur.  Lungs: clear anteriorly Abdomen: obese, soft, nontender, nondistended. No hepatosplenomegaly. No bruits or masses. Good bowel sounds. Extremities: no cyanosis,  clubbing.  1+ ankle edema. R and L toes purple.  RUE edema.  Neuro: alert & orientedx3, cranial nerves grossly intact. moves all 4 extremities w/o difficulty. Affect pleasant  Telemetry: Sinus Tach 110s-120s   Labs: Basic Metabolic Panel:  Recent Labs Lab 09/20/15 0530 09/20/15 2240 09/21/15 0530 09/21/15 1700 09/21/15 1816 09/22/15 0430 09/23/15 0430  NA 136 135 132* 133*  --  133* 134*  K 3.6 3.9 3.6 3.7  --  3.9 4.1  CL 95* 93* 92* 93*  --  96* 95*  CO2 32 31 31 29   --  29 28  GLUCOSE 93 98 92 97  --  96 94  Bennett 21* 19 17 18   --  15 13  CREATININE 0.89 1.01 0.84 0.96  --  0.74 0.69  CALCIUM 7.4* 7.9* 7.9* 8.1*  --  8.1* 8.6*  MG 1.3*  --   --  1.5* 1.6* 2.0 1.8    Liver Function Tests:  Recent Labs Lab 09/18/15 0400 09/19/15 0520 09/20/15 0530  AST 134* 143* 59*  ALT 97* 140* 93*  ALKPHOS 73 95 74  BILITOT 2.6* 3.7* 3.3*  PROT 5.1* 5.7* 5.1*  ALBUMIN 2.0* 2.1* 1.9*   No results for input(s): LIPASE, AMYLASE in the last 168 hours. No results for input(s): AMMONIA in the last 168 hours.  CBC:  Recent Labs Lab 09/19/15 0520 09/20/15 0530 09/21/15 1641 09/22/15 0430 09/23/15 0430  WBC 17.3*  18.5* 19.6* 17.4* 17.0*  NEUTROABS  --  15.0*  --   --   --   HGB 11.7* 10.8* 11.1* 10.2* 10.1*  HCT 38.0* 33.4* 35.4* 32.5* 32.7*  MCV 83.5 82.1 81.2 81.3 81.8  PLT 156 163 307 306 382    Cardiac Enzymes: No results for input(s): CKTOTAL, CKMB, CKMBINDEX, TROPONINI in the last 168 hours.  BNP: BNP (last 3 results)  Recent Labs  09/14/15 1610  BNP 1312.6*    ProBNP (last 3 results) No results for input(s): PROBNP in the last 8760 hours.    Other results:  Imaging: No results found.   Medications:     Scheduled Medications: . antiseptic oral rinse  7 mL Mouth Rinse BID  . citalopram  10 mg Oral Daily  . digoxin  0.25 mg Oral Daily  . famotidine  20 mg Oral BID  . furosemide  80 mg Intravenous TID AC  . ivabradine  5 mg Oral BID WC  .  lisinopril  7.5 mg Oral BID  . magnesium oxide  400 mg Oral BID  . polyethylene glycol  17 g Oral Daily  . potassium chloride  40 mEq Oral Daily  . sodium chloride flush  10-40 mL Intracatheter Q12H  . spironolactone  25 mg Oral Daily  . Warfarin - Pharmacist Dosing Inpatient   Does not apply q1800    Infusions: . sodium chloride 10 mL/hr at 09/20/15 0013  . bivalirudin (ANGIOMAX) infusion 0.5 mg/mL (Non-ACS indications) 0.15 mg/kg/hr (09/23/15 0556)  . milrinone 0.375 mcg/kg/min (09/23/15 0556)    PRN Medications: sodium chloride, acetaminophen, ALPRAZolam, ondansetron (ZOFRAN) IV, sodium chloride flush, zolpidem   Assessment/Plan/ Discussion     1. Cardiogenic shock 2. Acute systolic CHF: EF 30% by echo with mildly dilated and mildly dysfunctional RV. Low output HF with marked volume overload initially.  Was on milrinone but able to titrate off initially.  [place] on 2/23 showed good cardiac output off milrinone but still very volume overloaded. Milrinone restarted 2/25 for recurrent profound shock.  Concern for viral myocarditis, has had viral-type symptoms for several weeks and has had antibiotic courses at home prior to admission. Coxsackie Antibody A + so suspect viral myocarditis. - Co-ox now 60% on milrinone 0.375, Cut back to 0.25 mcg today.  - CVP 9-10.  Continue IV lasix 80 mg twice a day.  Switch torsemide 40 mg twice a day tomorrow.  - Concerned he may need advanced therapies. BMI currently 38 so over cutoff for transplant. At 6'3" would need to be 280 pounds to qualify. He would be LVAD candidate. RA/PCWP on cath < 0.50.  He will need home milrinone with attempt at slow weaning.  - Continue digoxin 0.25, level ok 2/27.   - Increase lisinopril to 10 mg bid and continue spironolactone 25 daily.    - Tolerating ivabradine, continue with sinus tachycardia.    - Would like to eventually get cardiac MRI but may be too large for MRI. 3. Acute respiratory failure: Suspect  pulmonary edema plays a role but cannot rule out autoimmune pneumonitis process => serologies all negative so far. He is on nasal cannula now with diuresis 4. Rash: Petechial rash on lower legs, platelets not significantly low. ?Vasculitis playing a role here but so far autoimmune workup negative. Possibly just excoriation.  5. Ischemic toes: Suspect due to low flow/low cardiac output => think unlikely to be cardioembolic with symmetry. No LV thrombus noted on echo. He has dopplerable and now palpable lower  extremity pulses.  Vascular has evaluated, recommend short course of anticoagulation.  Now that INR < 2, he is on bivalirudin gtt. INR 1.54    6. Elevated LFTs: Prominent elevated bilirubin. Suspect this is related to congestive hepatopathy. LFTs now down with hemodynamic support 7. ID: Had course of vancomycin/Zosyn, ? PNA component.  PCT was very elevated.  Cultures negative. Off antibiotics now. + Coxsackie A Antibody, suspect this is cause of viral myocarditis. WBC 17.0 .    8. Rheum: ?Autoimmune process like vasculitis. Elevated CRP, normal ESR. Autoimmune serologies all negative so far.Now off steroids.  9. AKI: Evaluated by renal, do not think there is autoimmune process involving kidneys, probably cardiorenal situation.  Bennett/creatinine now normal with hemodynamic support. 10. Anxiety/depression: Celexa and prn Xanax started 2/25 11. Hypomagnesemia : Today mag 1.8, Give 2 grams of mag    12. Blood Type A+ 13. RUE DVT: Confirmed on Ultrasound 2/27. Was on heparin gtt but PTT had been low. Now on bivalirudin.  Had not had PICC or CVL in right upper extremity. Hematology input appreciated. Platelets stable, unlikely HIT. Coumadin started.  14. NSVT: Yesterday received 4 grams Mag. Todays Mag 1.8 K 4.1. He will need Lifevest when discharged since he will likely be on milrinone. It has been requested.  15. Hypoalbuminemia- Albumin on 2/26 1.9. Prealbumin 9.5 Consult dietitian.   Referred  to [facility] for RN,OT, PT. Rolling Walker ordered.   Darrick Grinder, NP-C 09/23/2015   Patient seen with NP, agree with the above note.  Coxsackie A titers positive but IgG and not IgM.  He has, of note, been sick for well over a month now.  Suspect this picture is most likely a viral myocarditis.   - Agree with attempt to wean milrinone to 0.25.  Will likely need this at home, plan for slow wean over time.  - Increase lisinopril today, continue ivabradine and spironolactone.  - IV Lasix today with CVP 10, probably to po torsemide tomorrow.  - Will need Lifevest since going home on milrinone and has had NSVT.  - RUE DVT => on bivalirudin and warfarin started.   Possibly home by Friday or weekend, depending on INR progression.  Loralie Champagne 09/23/2015 8:38 AM

## 2015-09-24 LAB — PROTIME-INR
INR: 1.59 — AB (ref 0.00–1.49)
Prothrombin Time: 19 seconds — ABNORMAL HIGH (ref 11.6–15.2)

## 2015-09-24 LAB — CARBOXYHEMOGLOBIN
CARBOXYHEMOGLOBIN: 1.8 % — AB (ref 0.5–1.5)
CARBOXYHEMOGLOBIN: 1.6 % — AB (ref 0.5–1.5)
Methemoglobin: 0.9 % (ref 0.0–1.5)
Methemoglobin: 0.8 % (ref 0.0–1.5)
O2 SAT: 67.3 %
O2 Saturation: 74.6 %
Total hemoglobin: 9.8 g/dL — ABNORMAL LOW (ref 13.5–18.0)
Total hemoglobin: 10.9 g/dL — ABNORMAL LOW (ref 13.5–18.0)

## 2015-09-24 LAB — BASIC METABOLIC PANEL
Anion gap: 10 (ref 5–15)
BUN: 12 mg/dL (ref 6–20)
CHLORIDE: 96 mmol/L — AB (ref 101–111)
CO2: 28 mmol/L (ref 22–32)
CREATININE: 0.69 mg/dL (ref 0.61–1.24)
Calcium: 8.8 mg/dL — ABNORMAL LOW (ref 8.9–10.3)
GFR calc non Af Amer: >60 mL/min (ref >60)
Glucose, Bld: 88 mg/dL (ref 65–99)
POTASSIUM: 3.8 mmol/L (ref 3.5–5.1)
Sodium: 134 mmol/L — ABNORMAL LOW (ref 135–145)

## 2015-09-24 LAB — CBC WITH DIFFERENTIAL/PLATELET
BASOS ABS: 0 10*3/uL (ref 0.0–0.1)
Basophils Relative: 0 %
Eosinophils Absolute: 0.4 10*3/uL (ref 0.0–0.7)
Eosinophils Relative: 2 %
HEMATOCRIT: 33.3 % — AB (ref 39.0–52.0)
HEMOGLOBIN: 10.4 g/dL — AB (ref 13.0–17.0)
LYMPHS PCT: 7 %
Lymphs Abs: 1.3 10*3/uL (ref 0.7–4.0)
MCH: 25.6 pg — ABNORMAL LOW (ref 26.0–34.0)
MCHC: 31.2 g/dL (ref 30.0–36.0)
MCV: 82 fL (ref 78.0–100.0)
Monocytes Absolute: 1.8 10*3/uL — ABNORMAL HIGH (ref 0.1–1.0)
Monocytes Relative: 10 %
NEUTROS ABS: 13.9 10*3/uL — AB (ref 1.7–7.7)
Neutrophils Relative %: 81 %
Platelets: 463 10*3/uL — ABNORMAL HIGH (ref 150–400)
RBC: 4.06 MIL/uL — AB (ref 4.22–5.81)
RDW: 20.9 % — ABNORMAL HIGH (ref 11.5–15.5)
WBC: 17.3 10*3/uL — AB (ref 4.0–10.5)

## 2015-09-24 LAB — EPSTEIN BARR VRS(EBV DNA BY PCR)
EBV DNA QN BY PCR: 660 {copies}/mL
log10 EBV DNA Qn PCR: 2.82 log10 copy/mL

## 2015-09-24 LAB — APTT: aPTT: 62 seconds — ABNORMAL HIGH (ref 24–37)

## 2015-09-24 MED ORDER — POTASSIUM CHLORIDE CRYS ER 20 MEQ PO TBCR
20.0000 meq | EXTENDED_RELEASE_TABLET | Freq: Once | ORAL | Status: AC
  Administered 2015-09-24: 20 meq via ORAL
  Filled 2015-09-24: qty 1

## 2015-09-24 MED ORDER — WARFARIN SODIUM 2.5 MG PO TABS
2.5000 mg | ORAL_TABLET | Freq: Once | ORAL | Status: AC
  Administered 2015-09-24: 2.5 mg via ORAL
  Filled 2015-09-24: qty 1

## 2015-09-24 MED ORDER — FUROSEMIDE 10 MG/ML IJ SOLN
80.0000 mg | Freq: Two times a day (BID) | INTRAMUSCULAR | Status: AC
Start: 2015-09-24 — End: 2015-09-24
  Administered 2015-09-24 (×2): 80 mg via INTRAVENOUS
  Filled 2015-09-24 (×2): qty 8

## 2015-09-24 MED ORDER — TORSEMIDE 20 MG PO TABS: 40.0000 mg | ORAL_TABLET | Freq: Two times a day (BID) | ORAL | Status: DC

## 2015-09-24 NOTE — Progress Notes (Signed)
Physical Therapy Treatment Patient Details Name: James Bennett MRN: [id number] DOB: [birth year] Today's Date: 09/24/2015    History of Present Illness Patient is a 20 y/o male admitted with SOB and cough x 2.5 months and recent LE swelling and bluish color of toes.  Demonstrated 15% EF on echo with possible viral myocarditis. Did develop respiratory failure requiring bipap temporarily.    PT Comments    Pt admitted with above diagnosis. Pt currently with functional limitations due to balance and endurance deficits. Pt increasing distance with ambulation daily.  PRogressing toward goals.  Mom present today and hopeful that pt can go home Sat.   Pt will benefit from skilled PT to increase their independence and safety with mobility to allow discharge to the venue listed below.    Follow Up Recommendations  Home health PT;Supervision/Assistance - 24 hour     Equipment Recommendations  Rolling walker with 5" wheels;3in1 (PT) (bariatric)    Recommendations for Other Services Other (comment) (cardiac rehab)     Precautions / Restrictions Precautions Precautions: Fall Precaution Comments: watch HR Restrictions Weight Bearing Restrictions: No    Mobility  Bed Mobility               General bed mobility comments: in chair on  arrival.  Pt tried to use urinal in sitting prior to walk.   Transfers Overall transfer level: Needs assistance Equipment used: Rolling walker (2 wheeled) Transfers: Sit to/from Stand Sit to Stand: Min guard         General transfer comment: cues for hand placement and foot position  Ambulation/Gait Ambulation/Gait assistance: Min guard;+2 safety/equipment Ambulation Distance (Feet): 380 Feet Assistive device: Rolling walker (2 wheeled) Gait Pattern/deviations: Step-through pattern;Decreased stride length;Trunk flexed   Gait velocity interpretation: Below normal speed for age/gender General Gait Details: no signs of dyspnea, HR max 138. Cues to  ambulate on heels. Pt asked if he could stand and use urinal on return to room and pt was able to stand and use urinal with min guard assist.     Stairs            Wheelchair Mobility    Modified Rankin (Stroke Patients Only)       Balance Overall balance assessment: Needs assistance Sitting-balance support: No upper extremity supported;Feet supported Sitting balance-Leahy Scale: Good     Standing balance support: Bilateral upper extremity supported;During functional activity Standing balance-Leahy Scale: Fair Standing balance comment: min guard assist while pt used urinal without UE support.                     Cognition Arousal/Alertness: Awake/alert Behavior During Therapy: WFL for tasks assessed/performed Overall Cognitive Status: Within Functional Limits for tasks assessed                      Exercises      General Comments        Pertinent Vitals/Pain Pain Assessment: No/denies pain  HR 123-138 bpm    Home Living                      Prior Function            PT Goals (current goals can now be found in the care plan section) Acute Rehab PT Goals PT Goal Formulation: With patient/family Time For Goal Achievement: 10/01/15 Potential to Achieve Goals: Good Progress towards PT goals: Progressing toward goals    Frequency  Min 3X/week  PT Plan Current plan remains appropriate    Co-evaluation             End of Session Equipment Utilized During Treatment: Gait belt Activity Tolerance: Patient tolerated treatment well Patient left: in chair;with call bell/phone within reach;with family/visitor present     Time: 3875-6433 PT Time Calculation (min) (ACUTE ONLY): 21 min  Charges:  $Gait Training: 8-22 mins                    G Codes:      James Bennett Oct 19, 2015, 1:48 PM [employer] Acute Rehabilitation [phone] (pager)

## 2015-09-24 NOTE — Progress Notes (Signed)
Patient ID: James Bennett, male   DOB: [birth year], 20 y.o.   MRN: [id number]    Advanced Heart Failure Rounding Note   Subjective:    Milrinone restarted on 2/25 due to low co-ox (30-40s). Co-ox today 74.6% on milrinone 0.25. Weight shows up despite 3L output. ? accurracy.  RUE edema. Had ultrasound + DVT on 2/27. Heparin stopped (had been hard to keep PTT therapeutic) and switched to Bivalirudin.   Lisinopril increased to 10 mg twice a day yesterday. No SOB or orthopnea. Creatinine stable. Feeling better overall.    ECHO 09/15/2015 EF 15% IVC dilated RV mildly decreased.   [facility] Procedural Findings (2/23): Hemodynamics (mmHg) RA mean 19 RV 54/19 PA 51/32, mean 45 PCWP mean 40 Oxygen saturations: PA 57% AO 96% Cardiac Output (Fick) 6.02  Cardiac Index (Fick) 2.24 Cardiac Output (Thermo) 6.88 Cardiac Index (Thermo) 2.56  Objective:   Weight Range:  Vital Signs:   Temp:  [97.8 F (36.6 C)-99.7 F (37.6 C)] 98 F (36.7 C) (03/02 0747) Pulse Rate:  [129] 129 (03/01 0841) Resp:  [13-37] 37 (03/02 0400) BP: (91-121)/(59-67) 109/61 mmHg (03/02 0329) SpO2:  [94 %-97 %] 97 % (03/02 0747) Weight:  [289 lb 4.8 oz (131.226 kg)] 289 lb 4.8 oz (131.226 kg) (03/02 0233) Last BM Date: 09/22/15  Weight change: Filed Weights   09/22/15 0500 09/23/15 0404 09/24/15 0233  Weight: 304 lb 0.2 oz (137.9 kg) 283 lb 11.7 oz (128.7 kg) 289 lb 4.8 oz (131.226 kg)    Intake/Output:   Intake/Output Summary (Last 24 hours) at 09/24/15 0758 Last data filed at 09/24/15 0600  Gross per 24 hour  Intake 1491.4 ml  Output   3750 ml  Net -2258.6 ml     Physical Exam: CVP 11 General:  Sitting on side of bed.  HEENT: normal. LIJ  Neck: Thick. JVP 10. Carotids 2+ bilat; no bruits. No lymphadenopathy or thryomegaly appreciated. Cor: PMI laterally displaced.  Tachy, regular rate & rhythm. +S3.  No murmur.  Lungs: clear anteriorly Abdomen: obese, soft, nontender, nondistended. No hepatosplenomegaly.  No bruits or masses. Good bowel sounds. Extremities: no cyanosis, clubbing.  1+ ankle edema. R and L toes purple.  RUE edema.  Neuro: alert & orientedx3, cranial nerves grossly intact. moves all 4 extremities w/o difficulty. Affect pleasant  Telemetry: Reviewed personally, Remains in sinus Tach 110s-120s   Labs: Basic Metabolic Panel:  Recent Labs Lab 09/20/15 0530  09/21/15 0530 09/21/15 1700 09/21/15 1816 09/22/15 0430 09/23/15 0430 09/24/15 0521  NA 136  < > 132* 133*  --  133* 134* 134*  K 3.6  < > 3.6 3.7  --  3.9 4.1 3.8  CL 95*  < > 92* 93*  --  96* 95* 96*  CO2 32  < > 31 29  --  29 28 28   GLUCOSE 93  < > 92 97  --  96 94 88  BUN 21*  < > 17 18  --  15 13 12   CREATININE 0.89  < > 0.84 0.96  --  0.74 0.69 0.69  CALCIUM 7.4*  < > 7.9* 8.1*  --  8.1* 8.6* 8.8*  MG 1.3*  --   --  1.5* 1.6* 2.0 1.8  --   < > = values in this interval not displayed.  Liver Function Tests:  Recent Labs Lab 09/18/15 0400 09/19/15 0520 09/20/15 0530  AST 134* 143* 59*  ALT 97* 140* 93*  ALKPHOS 73 95 74  BILITOT 2.6* 3.7*  3.3*  PROT 5.1* 5.7* 5.1*  ALBUMIN 2.0* 2.1* 1.9*   No results for input(s): LIPASE, AMYLASE in the last 168 hours. No results for input(s): AMMONIA in the last 168 hours.  CBC:  Recent Labs Lab 09/19/15 0520 09/20/15 0530 09/21/15 1641 09/22/15 0430 09/23/15 0430  WBC 17.3* 18.5* 19.6* 17.4* 17.0*  NEUTROABS  --  15.0*  --   --   --   HGB 11.7* 10.8* 11.1* 10.2* 10.1*  HCT 38.0* 33.4* 35.4* 32.5* 32.7*  MCV 83.5 82.1 81.2 81.3 81.8  PLT 156 163 307 306 382    Cardiac Enzymes: No results for input(s): CKTOTAL, CKMB, CKMBINDEX, TROPONINI in the last 168 hours.  BNP: BNP (last 3 results)  Recent Labs  09/14/15 1610  BNP 1312.6*    ProBNP (last 3 results) No results for input(s): PROBNP in the last 8760 hours.    Other results:  Imaging: No results found.   Medications:     Scheduled Medications: . antiseptic oral rinse  7 mL  Mouth Rinse BID  . citalopram  10 mg Oral Daily  . digoxin  0.25 mg Oral Daily  . famotidine  20 mg Oral BID  . ivabradine  5 mg Oral BID WC  . lisinopril  10 mg Oral BID  . magnesium oxide  400 mg Oral BID  . potassium chloride  40 mEq Oral Daily  . sodium chloride flush  10-40 mL Intracatheter Q12H  . spironolactone  25 mg Oral Daily  . torsemide  40 mg Oral Daily  . Warfarin - Pharmacist Dosing Inpatient   Does not apply q1800    Infusions: . sodium chloride Stopped (09/23/15 0700)  . bivalirudin (ANGIOMAX) infusion 0.5 mg/mL (Non-ACS indications) 0.15 mg/kg/hr (09/23/15 1900)  . milrinone 0.25 mcg/kg/min (09/23/15 2212)    PRN Medications: sodium chloride, acetaminophen, ALPRAZolam, ondansetron (ZOFRAN) IV, polyethylene glycol, sodium chloride flush, zolpidem   Assessment/Plan/ Discussion     1. Cardiogenic shock 2. Acute systolic CHF: EF 44% by echo with mildly dilated and mildly dysfunctional RV. Low output HF with marked volume overload initially.  Was on milrinone but able to titrate off initially.  [place] on 2/23 showed good cardiac output off milrinone but still very volume overloaded. Milrinone restarted 2/25 for recurrent profound shock.  Concern for viral myocarditis, has had viral-type symptoms for several weeks and has had antibiotic courses at home prior to admission. Coxsackie Antibody A + so suspect viral myocarditis (IgG rather than IgM, so not totally sure how recent exposure was). - Co-ox 74.6% on milrinone 0.25, will decrease to 0.125 and recheck coox this afternoon.  - CVP 11-12. Will give IV lasix 80 mg BID today and attempt transition to po tomorrow.  - Concerned he may need advanced therapies. BMI currently 38 so over cutoff for transplant. At 6'3" would need to be 280 pounds to qualify. He would be LVAD candidate. RA/PCWP on cath < 0.50.  He will need home milrinone with attempt at slow weaning.  - Continue digoxin 0.25, level ok 2/27.   - Continue  lisinopril 10 mg bid and continue spironolactone 25 daily.    - Tolerating ivabradine, continue with sinus tachycardia.    - Would like to eventually get cardiac MRI but may be too large for MRI. - Will need PICC if going home with milrinone. Currently has [employer].  3. Acute respiratory failure: Suspect pulmonary edema plays a role but cannot rule out autoimmune pneumonitis process => serologies all negative so  far. He is on nasal cannula now with diuresis 4. Rash: Petechial rash on lower legs, platelets not significantly low. ?Vasculitis playing a role here but so far autoimmune workup negative. Possibly just excoriation.  5. Ischemic toes: Suspect due to low flow/low cardiac output => think unlikely to be cardioembolic with symmetry. No LV thrombus noted on echo. He has dopplerable and now palpable lower extremity pulses.  Vascular has evaluated, recommend short course of anticoagulation.  Now that INR < 2, he is on bivalirudin gtt. -  INR 1.59 6. Elevated LFTs: Prominent elevated bilirubin. Suspect this is related to congestive hepatopathy. LFTs now down with hemodynamic support 7. ID: Had course of vancomycin/Zosyn, ? PNA component.  PCT was very elevated.  Cultures negative. Off antibiotics now. + Coxsackie A Antibody, suspect this is cause of viral myocarditis.  - CBC pending.   8. Rheum: ?Autoimmune process like vasculitis. Elevated CRP, normal ESR. Autoimmune serologies all negative so far.Now off steroids.  9. AKI: Evaluated by renal, do not think there is autoimmune process involving kidneys, probably cardiorenal situation.  BUN/creatinine now normal with hemodynamic support. 10. Anxiety/depression: Celexa and prn Xanax started 2/25 11. Hypomagnesemia : Received 2 g mag 09/23/15 for mag 1.8    12. Blood Type A+ 13. RUE DVT: Confirmed on Ultrasound 2/27. Was on heparin gtt but PTT had been low. Now on bivalirudin.  Had not had PICC or CVL in right upper extremity. Hematology  input appreciated. Platelets stable, unlikely HIT. Coumadin started.  14. NSVT:  He will need Lifevest when discharged since he will likely be on milrinone. It has been requested.  - K 3.8, will supp.  Mg 1.8 09/23/15, received 2 g of Mg.  15. Hypoalbuminemia- Albumin on 2/26 1.9. Prealbumin 9.5 Dietitian consulted.  Referred to [facility] for RN,OT, PT. Rolling Walker ordered.   Satira Mccallum Tillery PA-C  09/24/2015   Advanced Heart Failure Team Pager [phone] (M-F; 7a - 4p)  Please contact [facility] for night-coverage after hours (4p -7a ) and weekends on [email or web address]  Patient seen with PA, agree with the above note.  Good co-ox today, CVP remains 11-12.  Will give IV Lasix probably one more day.  Decrease milrinone to 0.125 mcg/kg/min, follow carefully.    He remains on bivalirudin, waiting for SRA confirmation of HIT (low suspicion).  He remains on bivalirudin with conservative warfarin dosing. Right arm swelling improving.   Toes remain about the same, vascular recommendations noted.   Suspect he will be here until at least Saturday.   Loralie Champagne 09/24/2015 11:00 AM

## 2015-09-24 NOTE — Progress Notes (Signed)
ANTICOAGULATION CONSULT NOTE - Follow Up Consult  Pharmacy Consult for Angiomax and warfarin Indication: DVT on IV heparin  No Known Allergies  Patient Measurements: Height: 6\' 2"  (188 cm) Weight: 289 lb 4.8 oz (131.226 kg) IBW/kg (Calculated) : 82.2  Vital Signs: Temp: 98 F (36.7 C) (03/02 0747) Temp Source: Oral (03/02 0747) BP: 140/72 mmHg (03/02 0817) Pulse Rate: 127 (03/02 0927)  Labs:  Recent Labs  09/21/15 1230  09/21/15 1641  09/21/15 1700  09/22/15 0430 09/23/15 0430 09/24/15 0521  HGB  --   < > 11.1*  --   --   --  10.2* 10.1*  --   HCT  --   --  35.4*  --   --   --  32.5* 32.7*  --   PLT  --   --  307  --   --   --  306 382  --   APTT  --   --   --   --   --   < > 56* 57* 62*  LABPROT  --   --   --   --  16.1*  --   --  18.6* 19.0*  INR  --   --   --   --  1.28  --   --  1.54* 1.59*  HEPARINUNFRC 0.15*  --   --   --   --   --   --   --   --   CREATININE  --   --   --   < > 0.96  --  0.74 0.69 0.69  < > = values in this interval not displayed.  Estimated Creatinine Clearance: 212.1 mL/min (by C-G formula based on Cr of 0.69).   Assessment: 20 yo m with no PMH admitted on 2/20 with bilateral LE swelling x 1 week and SOB x 2.5 months - treated for bronchitis/pna and viral illness as oupt.Found to have an EF of 15% and newly diagnosed cardiomyopathy. He has been on IV heparin for a possible embolic event d/t cyanotic toes and feet. Initial LE doppler negative for DVT, no VQ or CT scan but O2 sat 98% RA, and ECHO neg for clot. Now upper extremity doppler is positive for acute thrombus in right Internal jugular vein and subclavian vein. Right cephalic vein superificial vein thrombus. Pharmacy is consulted to dose bivalirudin and warfarin.  Bivalirudin 0.15mg /kg/hr (wt140kg) all aPTTs have been in range 52, 58, 56, 62 sec LFTs and INR elevated on admit probably from poor CO and liver congestion > improved now  INR 1.19 prior to bivalirudin started - which  falsely elevates INR - INR reached 1.28 on bivalirudin After warfarin 7.5mg  INR now 1.54. Per hematology, would not like to exceed a dose > 2.5mg  due to possibility of inducing coagulopathy.  INR's since warfarin 1.54>1.59  Goal of Therapy:  APTT = 50-85    INR 2-3 Monitor platelets by anticoagulation protocol: Yes   Plan:  1) Continue Bivalirudin 0.15mg /kg/hr = 42.1 ml/hr 2) Warfarin 2.5mg  x1 3) Daily CBC, aPTT, INR   James Bennett, PharmD Clinical Pharmacy Resident Pager: [phone] 09/24/2015 10:47 AM

## 2015-09-24 NOTE — Progress Notes (Signed)
Occupational Therapy Treatment Patient Details Name: James Bennett MRN: [id number] DOB: June 02, 1996 Today's Date: 09/24/2015    History of present illness Patient is a 20 y/o male admitted with SOB and cough x 2.5 months and recent LE swelling and bluish color of toes.  Demonstrated 15% EF on echo with possible viral myocarditis. Did develop respiratory failure requiring bipap temporarily.   OT comments  Pt eager to go home. Demonstrating good insight on how he will need to manage his activities and diet once he is home.   Follow Up Recommendations  Home health OT    Equipment Recommendations       Recommendations for Other Services      Precautions / Restrictions Precautions Precautions: Fall Precaution Comments: watch HR Restrictions Weight Bearing Restrictions: No       Mobility Bed Mobility               General bed mobility comments: in chair  Transfers Overall transfer level: Needs assistance Equipment used: Rolling walker (2 wheeled) Transfers: Sit to/from Stand Sit to Stand: Min guard         General transfer comment: cues for technique, uses momentum, no physical assist    Balance Overall balance assessment: Needs assistance Sitting-balance support: No upper extremity supported;Feet supported Sitting balance-Leahy Scale: Good     Standing balance support: Bilateral upper extremity supported;During functional activity Standing balance-Leahy Scale: Fair Standing balance comment: able to release walker in standing for grooming and urinating                   ADL Overall ADL's : Needs assistance/impaired     Grooming: Wash/dry hands;Oral care;Standing;Supervision/safety         Lower Body Bathing Details (indicate cue type and reason): recommended long handled bath sponge for feet and back       Lower Body Dressing Details (indicate cue type and reason): Pt will rely on his family to assist with socks. Able to don slip on shoes. Toilet  Transfer: Ambulation;RW;Min Pension scheme manager Details (indicate cue type and reason): stood to urinate, pt has a 3 in 1 at home and mom bought a riser Psychiatrist and Hygiene: Supervision/safety       Functional mobility during ADLs: Min guard;Rolling walker General ADL Comments: Reinforced energy conservation strategies. Pt asking if he should use the electric shopping cart when he goes to the store. Plans to sleep in lift chair at first when returning home, cautioned pt to not rely on lift mechanism. Pt with concerns about having to be on a low sodium diet, understands the importance. Educated pt to go to table for meals, walk to bathroom and avoid use of urinal during daytime, performing as much of his self care as possible from a seated position and gradually increasing distance he ambulates with supervision of his [facility] therapists.       Vision                     Perception     Praxis      Cognition   Behavior During Therapy: [facility] for tasks assessed/performed Overall Cognitive Status: Within Functional Limits for tasks assessed                       Extremity/Trunk Assessment               Exercises     Shoulder Instructions       General  Comments      Pertinent Vitals/ Pain       Pain Assessment: No/denies pain  Home Living                                          Prior Functioning/Environment              Frequency Min 2X/week     Progress Toward Goals  OT Goals(current goals can now be found in the care plan section)  Progress towards OT goals: Progressing toward goals  Acute Rehab OT Goals Patient Stated Goal: To go home  Plan Discharge plan remains appropriate    Co-evaluation                 End of Session Equipment Utilized During Treatment: Gait belt;Rolling walker   Activity Tolerance Patient tolerated treatment well   Patient Left in chair;with call bell/phone within  reach   Nurse Communication          Time: 1610-9604 OT Time Calculation (min): 36 min  Charges: OT General Charges $OT Visit: 1 Procedure OT Treatments $Self Care/Home Management : 23-37 mins  Evern Bio 09/24/2015, 4:43 PM [phone]

## 2015-09-24 NOTE — Progress Notes (Signed)
Patient ID: James Bennett, male   DOB: November 13, 1995, 20 y.o.   MRN: [id number] Hematology: SPEP/IFE normal On bivalirudin/coumadin SRA assay still outstanding. Per my discussion w Dr Morton Peters, no immediate plan to use ventricular assist device. Note: persistent elevation of white count likely related to inflammatory reaction to ischemic necrosis of toes. Low clinical suspicion for a myeloproliferative disorder but given his young age and idiopathic clotting, I will check a JAK-2 gene analysis.

## 2015-09-25 LAB — COMPREHENSIVE METABOLIC PANEL
ALBUMIN: 2.5 g/dL — AB (ref 3.5–5.0)
ALK PHOS: 87 U/L (ref 38–126)
ALT: 60 U/L (ref 17–63)
ANION GAP: 9 (ref 5–15)
AST: 48 U/L — ABNORMAL HIGH (ref 15–41)
BUN: 12 mg/dL (ref 6–20)
CALCIUM: 8.8 mg/dL — AB (ref 8.9–10.3)
CHLORIDE: 97 mmol/L — AB (ref 101–111)
CO2: 28 mmol/L (ref 22–32)
CREATININE: 0.81 mg/dL (ref 0.61–1.24)
GFR calc non Af Amer: >60 mL/min (ref >60)
GLUCOSE: 95 mg/dL (ref 65–99)
POTASSIUM: 4.4 mmol/L (ref 3.5–5.1)
SODIUM: 134 mmol/L — AB (ref 135–145)
Total Bilirubin: 1.8 mg/dL — ABNORMAL HIGH (ref 0.3–1.2)
Total Protein: 6.6 g/dL (ref 6.5–8.1)

## 2015-09-25 LAB — CARBOXYHEMOGLOBIN
CARBOXYHEMOGLOBIN: 1.5 % (ref 0.5–1.5)
METHEMOGLOBIN: 0.7 % (ref 0.0–1.5)
O2 SAT: 57 %
TOTAL HEMOGLOBIN: 15.3 g/dL (ref 13.5–18.0)

## 2015-09-25 LAB — CBC WITH DIFFERENTIAL/PLATELET
BASOS ABS: 0 10*3/uL (ref 0.0–0.1)
Basophils Relative: 0 %
EOS ABS: 0.4 10*3/uL (ref 0.0–0.7)
Eosinophils Relative: 2 %
HEMATOCRIT: 31.6 % — AB (ref 39.0–52.0)
HEMOGLOBIN: 10.2 g/dL — AB (ref 13.0–17.0)
LYMPHS PCT: 9 %
Lymphs Abs: 1.6 10*3/uL (ref 0.7–4.0)
MCH: 26.5 pg (ref 26.0–34.0)
MCHC: 32.3 g/dL (ref 30.0–36.0)
MCV: 82.1 fL (ref 78.0–100.0)
MONOS PCT: 10 %
Monocytes Absolute: 1.8 10*3/uL — ABNORMAL HIGH (ref 0.1–1.0)
NEUTROS ABS: 13.7 10*3/uL — AB (ref 1.7–7.7)
NEUTROS PCT: 79 %
Platelets: 444 10*3/uL — ABNORMAL HIGH (ref 150–400)
RBC: 3.85 MIL/uL — AB (ref 4.22–5.81)
RDW: 21.1 % — ABNORMAL HIGH (ref 11.5–15.5)
WBC: 17.5 10*3/uL — ABNORMAL HIGH (ref 4.0–10.5)

## 2015-09-25 LAB — PROTIME-INR
INR: 1.62 — AB (ref 0.00–1.49)
PROTHROMBIN TIME: 19.3 s — AB (ref 11.6–15.2)

## 2015-09-25 LAB — FACTOR 5 LEIDEN

## 2015-09-25 LAB — APTT: aPTT: 56 seconds — ABNORMAL HIGH (ref 24–37)

## 2015-09-25 MED ORDER — DIPHENHYDRAMINE HCL 25 MG PO CAPS
25.0000 mg | ORAL_CAPSULE | Freq: Four times a day (QID) | ORAL | Status: DC | PRN
  Administered 2015-09-25 – 2015-09-30 (×2): 25 mg via ORAL
  Filled 2015-09-25 (×2): qty 1

## 2015-09-25 MED ORDER — SODIUM CHLORIDE 0.9% FLUSH: 10.0000 mL | INTRAVENOUS | Status: DC | PRN

## 2015-09-25 MED ORDER — IVABRADINE HCL 5 MG PO TABS: 5.0000 mg | ORAL_TABLET | Freq: Two times a day (BID) | ORAL | Status: DC

## 2015-09-25 MED ORDER — FUROSEMIDE 10 MG/ML IJ SOLN
80.0000 mg | Freq: Three times a day (TID) | INTRAMUSCULAR | Status: DC
  Administered 2015-09-25 – 2015-09-27 (×7): 80 mg via INTRAVENOUS
  Filled 2015-09-25 (×8): qty 8

## 2015-09-25 MED ORDER — LISINOPRIL 5 MG PO TABS
15.0000 mg | ORAL_TABLET | Freq: Every day | ORAL | Status: DC
  Administered 2015-09-25 – 2015-09-26 (×2): 15 mg via ORAL
  Filled 2015-09-25 (×2): qty 1

## 2015-09-25 MED ORDER — LISINOPRIL 10 MG PO TABS
10.0000 mg | ORAL_TABLET | Freq: Every evening | ORAL | Status: DC
  Administered 2015-09-25: 10 mg via ORAL
  Filled 2015-09-25: qty 1

## 2015-09-25 MED ORDER — IVABRADINE HCL 7.5 MG PO TABS
7.5000 mg | ORAL_TABLET | Freq: Two times a day (BID) | ORAL | Status: DC
  Administered 2015-09-25 – 2015-10-01 (×12): 7.5 mg via ORAL
  Filled 2015-09-25 (×14): qty 1

## 2015-09-25 MED ORDER — SODIUM CHLORIDE 0.9% FLUSH
10.0000 mL | Freq: Two times a day (BID) | INTRAVENOUS | Status: DC
  Administered 2015-09-26: 20 mL
  Administered 2015-09-29 – 2015-10-01 (×4): 10 mL

## 2015-09-25 MED ORDER — HYDROCORTISONE 1 % EX CREA
TOPICAL_CREAM | CUTANEOUS | Status: DC | PRN
  Administered 2015-09-25 – 2015-09-27 (×3): via TOPICAL
  Filled 2015-09-25 (×2): qty 28

## 2015-09-25 MED ORDER — WARFARIN SODIUM 5 MG PO TABS
5.0000 mg | ORAL_TABLET | Freq: Once | ORAL | Status: AC
Start: 2015-09-25 — End: 2015-09-25
  Administered 2015-09-25: 5 mg via ORAL
  Filled 2015-09-25: qty 1

## 2015-09-25 MED ORDER — TORSEMIDE 20 MG PO TABS: 40.0000 mg | ORAL_TABLET | Freq: Every day | ORAL | Status: DC

## 2015-09-25 MED ORDER — SODIUM CHLORIDE 0.9% FLUSH: 10.0000 mL | Freq: Two times a day (BID) | INTRAVENOUS | Status: DC

## 2015-09-25 NOTE — Progress Notes (Signed)
Peripherally Inserted Central Catheter/Midline Placement  The IV Nurse has discussed with the patient and/or persons authorized to consent for the patient, the purpose of this procedure and the potential benefits and risks involved with this procedure.  The benefits include less needle sticks, lab draws from the catheter and patient may be discharged home with the catheter.  Risks include, but not limited to, infection, bleeding, blood clot (thrombus formation), and puncture of an artery; nerve damage and irregular heat beat.  Alternatives to this procedure were also discussed.  PICC/Midline Placement Documentation        James Bennett 09/25/2015, 11:53 AM

## 2015-09-25 NOTE — Progress Notes (Signed)
Patient ID: James Bennett, male   DOB: 04/03/1996, 20 y.o.   MRN: [id number]    Advanced Heart Failure Rounding Note   Subjective:    Milrinone restarted on 2/25 due to low co-ox (30-40s).  Weaned milrinone to 0.125 yesterday but now more tachycardic with co-ox down to 57%.  CVP up to 16-17 today.   Had ultrasound + DVT on 2/27. Heparin stopped (had been hard to keep PTT therapeutic) and switched to bivalirudin and coumadin started.  RUE swelling has decreased.   Walked yesterday, doing better with this.    ECHO 09/15/2015 EF 15% IVC dilated RV mildly decreased.   RHC Procedural Findings (2/23): Hemodynamics (mmHg) RA mean 19 RV 54/19 PA 51/32, mean 45 PCWP mean 40 Oxygen saturations: PA 57% AO 96% Cardiac Output (Fick) 6.02  Cardiac Index (Fick) 2.24 Cardiac Output (Thermo) 6.88 Cardiac Index (Thermo) 2.56  Objective:   Weight Range:  Vital Signs:   Temp:  [98.2 F (36.8 C)-98.8 F (37.1 C)] 98.8 F (37.1 C) (03/03 0743) Pulse Rate:  [127-135] 135 (03/03 0743) Resp:  [17-39] 21 (03/03 0700) BP: (103-140)/(51-77) 120/67 mmHg (03/03 0400) SpO2:  [97 %-98 %] 97 % (03/03 0743) Weight:  [282 lb 14.4 oz (128.323 kg)] 282 lb 14.4 oz (128.323 kg) (03/03 0500) Last BM Date: 09/24/15  Weight change: Filed Weights   09/23/15 0404 09/24/15 0233 09/25/15 0500  Weight: 283 lb 11.7 oz (128.7 kg) 289 lb 4.8 oz (131.226 kg) 282 lb 14.4 oz (128.323 kg)    Intake/Output:   Intake/Output Summary (Last 24 hours) at 09/25/15 0816 Last data filed at 09/25/15 0600  Gross per 24 hour  Intake 1991.8 ml  Output   2625 ml  Net -633.2 ml     Physical Exam: CVP 16 General:  NAD.  HEENT: normal. [facility]  Neck: Thick. JVP difficult but appears elevated. Carotids 2+ bilat; no bruits. No lymphadenopathy or thryomegaly appreciated. Cor: PMI laterally displaced.  Tachy, regular rate & rhythm. +S3.  No murmur.  Lungs: clear anteriorly Abdomen: obese, soft, nontender, nondistended. No  hepatosplenomegaly. No bruits or masses. Good bowel sounds. Extremities: no cyanosis, clubbing.  1+ ankle edema. R and L toes purple.  RUE edema.  Neuro: alert & orientedx3, cranial nerves grossly intact. moves all 4 extremities w/o difficulty. Affect pleasant  Telemetry: Reviewed personally, Remains in sinus tach 120s-130s  Labs: Basic Metabolic Panel:  Recent Labs Lab 09/20/15 0530  09/21/15 1700 09/21/15 1816 09/22/15 0430 09/23/15 0430 09/24/15 0521 09/25/15 0400  NA 136  < > 133*  --  133* 134* 134* 134*  K 3.6  < > 3.7  --  3.9 4.1 3.8 4.4  CL 95*  < > 93*  --  96* 95* 96* 97*  CO2 32  < > 29  --  29 28 28 28   GLUCOSE 93  < > 97  --  96 94 88 95  BUN 21*  < > 18  --  15 13 12 12   CREATININE 0.89  < > 0.96  --  0.74 0.69 0.69 0.81  CALCIUM 7.4*  < > 8.1*  --  8.1* 8.6* 8.8* 8.8*  MG 1.3*  --  1.5* 1.6* 2.0 1.8  --   --   < > = values in this interval not displayed.  Liver Function Tests:  Recent Labs Lab 09/19/15 0520 09/20/15 0530 09/25/15 0400  AST 143* 59* 48*  ALT 140* 93* 60  ALKPHOS 95 74 87  BILITOT 3.7* 3.3*  1.8*  PROT 5.7* 5.1* 6.6  ALBUMIN 2.1* 1.9* 2.5*   No results for input(s): LIPASE, AMYLASE in the last 168 hours. No results for input(s): AMMONIA in the last 168 hours.  CBC:  Recent Labs Lab 09/20/15 0530 09/21/15 1641 09/22/15 0430 09/23/15 0430 09/24/15 1400 09/25/15 0400  WBC 18.5* 19.6* 17.4* 17.0* 17.3* 17.5*  NEUTROABS 15.0*  --   --   --  13.9* 13.7*  HGB 10.8* 11.1* 10.2* 10.1* 10.4* 10.2*  HCT 33.4* 35.4* 32.5* 32.7* 33.3* 31.6*  MCV 82.1 81.2 81.3 81.8 82.0 82.1  PLT 163 307 306 382 463* 444*    Cardiac Enzymes: No results for input(s): CKTOTAL, CKMB, CKMBINDEX, TROPONINI in the last 168 hours.  BNP: BNP (last 3 results)  Recent Labs  09/14/15 1610  BNP 1312.6*    ProBNP (last 3 results) No results for input(s): PROBNP in the last 8760 hours.    Other results:  Imaging: No results  found.   Medications:     Scheduled Medications: . antiseptic oral rinse  7 mL Mouth Rinse BID  . citalopram  10 mg Oral Daily  . digoxin  0.25 mg Oral Daily  . famotidine  20 mg Oral BID  . furosemide  80 mg Intravenous 3 times per day  . ivabradine  5 mg Oral BID WC  . lisinopril  10 mg Oral QPM  . lisinopril  15 mg Oral QPC breakfast  . magnesium oxide  400 mg Oral BID  . potassium chloride  40 mEq Oral Daily  . sodium chloride flush  10-40 mL Intracatheter Q12H  . spironolactone  25 mg Oral Daily  . Warfarin - Pharmacist Dosing Inpatient   Does not apply q1800    Infusions: . sodium chloride Stopped (09/23/15 0700)  . bivalirudin (ANGIOMAX) infusion 0.5 mg/mL (Non-ACS indications) 0.15 mg/kg/hr (09/24/15 2050)  . milrinone 0.125 mcg/kg/min (09/24/15 2343)    PRN Medications: sodium chloride, acetaminophen, ALPRAZolam, ondansetron (ZOFRAN) IV, polyethylene glycol, sodium chloride flush, zolpidem   Assessment/Plan/ Discussion     1. Cardiogenic shock 2. Acute systolic CHF: EF 01% by echo with mildly dilated and mildly dysfunctional RV. Low output HF with marked volume overload initially.  Was on milrinone but able to titrate off initially.  [place] on 2/23 showed good cardiac output off milrinone but still very volume overloaded. Milrinone restarted 2/25 for recurrent profound shock.  Concern for viral myocarditis, has had viral-type symptoms for several weeks and has had antibiotic courses at home prior to admission. Coxsackie Antibody A + so suspect viral myocarditis (IgG rather than IgM, so not totally sure how recent exposure was). Co-ox down to 57% and more tachycardic on milrinone 0.125.  CVP up to 16-17.   - Increase milrinone back to 0.25, will likely need to go home on this dose.  - Lasix 80 mg IV every 8 hrs today with higher CVP.   - Concerned he may need advanced therapies. He would be LVAD candidate and weight is now down in range where transplant would be  feasible. RA/PCWP on cath < 0.50.  He will need home milrinone with attempt at slow weaning.  - Continue digoxin 0.25, level tomorrow.   - Increase lisinopril to 15 qam/10 qpm and continue spironolactone 25 daily.    - Tolerating ivabradine, continue with sinus tachycardia.    - Would like to eventually get cardiac MRI but may be too large for MRI. - Will need PICC if going home with milrinone. Currently has  CVL.  Will go ahead and arrange for PICC today.  3. Acute respiratory failure: Suspect pulmonary edema plays a role but cannot rule out autoimmune pneumonitis process => serologies all negative so far. He is now off oxygen.  4. Rash: Petechial rash on lower legs, platelets not significantly low. ?Vasculitis playing a role here but so far autoimmune workup negative. Possibly just excoriation.  5. Ischemic toes: Suspect due to low flow/low cardiac output => think unlikely to be cardioembolic with symmetry. No LV thrombus noted on echo. He has dopplerable and now palpable lower extremity pulses.  Vascular has evaluated, will need followup with vascular as outpatient.   6. Elevated LFTs: Prominent elevated bilirubin. Suspect this is related to congestive hepatopathy. LFTs now down with hemodynamic support 7. ID: Had course of vancomycin/Zosyn, ? PNA component.  PCT was very elevated.  Cultures negative. Off antibiotics now. + Coxsackie A Antibody, suspect this is may be cause of viral myocarditis.  Elevated WBCs may be inflammation from necrotic toes.  8. Rheum: ?Autoimmune process like vasculitis. Elevated CRP, normal ESR. Autoimmune serologies all negative so far.Now off steroids.  9. AKI: Evaluated by renal, do not think there is autoimmune process involving kidneys, probably cardiorenal situation.  BUN/creatinine now normal with hemodynamic support. 10. Anxiety/depression: Celexa and prn Xanax started 2/25 11. Blood Type A+ 12. RUE DVT: Confirmed on Ultrasound 2/27. Was on heparin gtt but  PTT had been low. Now on bivalirudin.  Had not had PICC or CVL in right upper extremity. Hematology input appreciated. Platelets stable, unlikely HIT. 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  Coumadin started, INR 1.62 today will continue bivalirudin.  13. NSVT:  He will need Lifevest when discharged since he will likely be on milrinone. It has been requested.   Referred to [facility] for RN,OT, PT. Rolling Walker ordered.   35 minutes critical care time.   Loralie Champagne  09/25/2015

## 2015-09-25 NOTE — Progress Notes (Signed)
Called P.A. regarding redness and splotchy areas on back and BUE and BLE extremities.  Patient w/o c/o irritation or pain.  Orders given and initiated.  Will continue to monitor for any futher changes.

## 2015-09-25 NOTE — Progress Notes (Signed)
ANTICOAGULATION CONSULT NOTE - Follow Up Consult  Pharmacy Consult for Angiomax and warfarin Indication: DVT on IV heparin  No Known Allergies  Patient Measurements: Height: 6\' 2"  (188 cm) Weight: 282 lb 14.4 oz (128.323 kg) IBW/kg (Calculated) : 82.2  Vital Signs: Temp: 98.8 F (37.1 C) (03/03 0743) Temp Source: Oral (03/03 0743) BP: 120/67 mmHg (03/03 0400) Pulse Rate: 135 (03/03 0743)  Labs:  Recent Labs  09/23/15 0430 09/24/15 0521 09/24/15 1400 09/25/15 0400  HGB 10.1*  --  10.4* 10.2*  HCT 32.7*  --  33.3* 31.6*  PLT 382  --  463* 444*  APTT 57* 62*  --  56*  LABPROT 18.6* 19.0*  --  19.3*  INR 1.54* 1.59*  --  1.62*  CREATININE 0.69 0.69  --  0.81    Estimated Creatinine Clearance: 207 mL/min (by C-G formula based on Cr of 0.81).   Assessment: 20 yo m with no PMH admitted on 2/20 with bilateral LE swelling x 1 week and SOB x 2.5 months - treated for bronchitis/pna and viral illness as oupt.Found to have an EF of 15% and newly diagnosed cardiomyopathy. He has been on IV heparin for a possible embolic event d/t cyanotic toes and feet. Initial LE doppler negative for DVT, no VQ or CT scan but O2 sat 98% RA, and ECHO neg for clot. Now upper extremity doppler is positive for acute thrombus in right Internal jugular vein and subclavian vein. Right cephalic vein superificial vein thrombus. Pharmacy is consulted to dose bivalirudin and warfarin.  Bivalirudin 0.15mg /kg/hr (wt140kg) all aPTTs have been in range 52, 58, 56, 62, 56 sec LFTs and INR elevated on admit probably from poor CO and liver congestion > improved now  INR 1.19 prior to bivalirudin started - which falsely elevates INR - INR reached 1.28 on bivalirudin After warfarin 7.5mg  INR now 1.54. Per hematology, would not like to exceed a dose > 2.5mg  due to possibility of inducing coagulopathy >> and titrate slow after that.  INR's since warfarin 1.54>1.59  Goal of Therapy:  APTT = 50-85    INR  2-3 Monitor platelets by anticoagulation protocol: Yes   Plan:  1) Continue Bivalirudin 0.15mg /kg/hr = 42.1 ml/hr 2) Warfarin 5mg  x1 3) Daily CBC, aPTT, INR   [name], PharmD Clinical Pharmacy Resident Pager: [phone] 09/25/2015 10:20 AM

## 2015-09-25 NOTE — Progress Notes (Addendum)
MEDICATION RELATED CONSULT NOTE - INITIAL   Pharmacy Consult for Rash  No Known Allergies  Consulted by Cardiology to attempt to illicit a culprit from pt's med list as a cause of his red splotchy rash on the LE. Eosinophils are not noted to be elevated.  Exposures include:   Alprazolam: Dermatologic: Stevens-Johnson syndrome   Pepcid: Dermatologic: Stevens-Johnson syndrome (very rare ), Toxic epidermal necrolysis (very rare )   Lasix and Demadex: Dermatologic: Drug reaction with eosinophilia and systemic symptoms, Erythema multiforme, Erythroderma, Stevens-Johnson syndrome, Toxic epidermal necrolysis due to drug   Lisinopril: Dermatologic: Stevens-Johnson syndrome (1% or more ), Toxic epidermal necrolysis (1% or more )   Spironolactone: Dermatologic: Stevens-Johnson syndrome, Toxic epidermal necrolysis   Warfarin: Dermatologic: Calciphylaxis, Tissue necrosis (less than 0.1% )   Zosyn 2/20-2/27: Dermatologic: Pruritus (3.1% to 3.2% ), Rash (less than 1.4% to 4.2% ), Erythema multiforme, Generalized exanthematous pustulosis, acute, Stevens-Johnson syndrome, Toxic epidermal necrolysis  And [place] 2/20-2/27 Bival Celexa Dig Ivabradine:  Milrinone   Plan:  Listed above are the most common incidences of dermatologic reactions to pt's medications. There is absolutely no way to illicit the exact cause of a localized red, splotchy rash with no itching. Please consider a medication cause as well as an irritant contact dermatitis (sheets, SCDs, socks, soap).   Misha Antonini S. Alford Highland, PharmD, BCPS Clinical Staff Pharmacist Pager [phone]  Eilene Ghazi Stillinger 09/25/2015,8:58 PM

## 2015-09-25 NOTE — Progress Notes (Signed)
CARDIAC REHAB PHASE I   PRE:  Rate/Rhythm: 129 ST in bed     BP: sitting 145/62    SaO2: 95 RA  MODE:  Ambulation: 390 ft   POST:  Rate/Rhythm: 148 ST    BP: to BSC     SaO2:   Pt with increased HR today. Also has dry cough in bed. Tolerated fairly well walking except HR slowly increased to 148 ST. I had him rest briefly at 270 ft to allow his rate to decrease before walking another 120 ft. Pt sts he is not SOB or dizzy. To BSC after walking so no BP taken. Some review of education however pt did not know how much sodium or daily wts. Encouraged him to read information (since he was on [facility]) however his mother is reading the book at home. Will review more later. [phone]   Harriet Masson CES, ACSM 09/25/2015 3:07 PM

## 2015-09-25 NOTE — Progress Notes (Addendum)
lefevest has been approved by ins. kellie w zoll will fit for lifevest day before disch. ahc aware pt needs rn-pt-ot and poss home milrinone. Will need guidance from md when ready for dc so can alert zoll for vest and ahc for iv milrinone. Will cont to follow. If disch over weeken call kelliw lanier at [phone] and she will fit for lifevest. lifevest has already been approved.

## 2015-09-25 NOTE — Progress Notes (Signed)
Pt temp 101. MD notified. Blood cultures ordered.

## 2015-09-26 LAB — DIGOXIN LEVEL: Digoxin Level: 0.5 ng/mL — ABNORMAL LOW (ref 0.8–2.0)

## 2015-09-26 LAB — URINALYSIS, ROUTINE W REFLEX MICROSCOPIC
Bilirubin Urine: NEGATIVE
Glucose, UA: NEGATIVE mg/dL
Ketones, ur: NEGATIVE mg/dL
LEUKOCYTES UA: NEGATIVE
NITRITE: NEGATIVE
PROTEIN: NEGATIVE mg/dL
SPECIFIC GRAVITY, URINE: 1.013 (ref 1.005–1.030)
pH: 6.5 (ref 5.0–8.0)

## 2015-09-26 LAB — BASIC METABOLIC PANEL
Anion gap: 10 (ref 5–15)
BUN: 16 mg/dL (ref 6–20)
CHLORIDE: 96 mmol/L — AB (ref 101–111)
CO2: 26 mmol/L (ref 22–32)
Calcium: 8.7 mg/dL — ABNORMAL LOW (ref 8.9–10.3)
Creatinine, Ser: 0.75 mg/dL (ref 0.61–1.24)
GFR calc non Af Amer: >60 mL/min (ref >60)
Glucose, Bld: 132 mg/dL — ABNORMAL HIGH (ref 65–99)
POTASSIUM: 3.5 mmol/L (ref 3.5–5.1)
SODIUM: 132 mmol/L — AB (ref 135–145)

## 2015-09-26 LAB — PROTIME-INR
INR: 1.65 — AB (ref 0.00–1.49)
PROTHROMBIN TIME: 19.5 s — AB (ref 11.6–15.2)

## 2015-09-26 LAB — CBC
HCT: 30 % — ABNORMAL LOW (ref 39.0–52.0)
HEMOGLOBIN: 9.2 g/dL — AB (ref 13.0–17.0)
MCH: 25.1 pg — ABNORMAL LOW (ref 26.0–34.0)
MCHC: 30.7 g/dL (ref 30.0–36.0)
MCV: 81.7 fL (ref 78.0–100.0)
PLATELETS: 396 10*3/uL (ref 150–400)
RBC: 3.67 MIL/uL — AB (ref 4.22–5.81)
RDW: 21.1 % — ABNORMAL HIGH (ref 11.5–15.5)
WBC: 12.3 10*3/uL — AB (ref 4.0–10.5)

## 2015-09-26 LAB — URINE MICROSCOPIC-ADD ON

## 2015-09-26 LAB — CARBOXYHEMOGLOBIN
CARBOXYHEMOGLOBIN: 1.9 % — AB (ref 0.5–1.5)
METHEMOGLOBIN: 0.6 % (ref 0.0–1.5)
O2 SAT: 65.3 %
TOTAL HEMOGLOBIN: 14.4 g/dL (ref 13.5–18.0)

## 2015-09-26 LAB — APTT: aPTT: 66 seconds — ABNORMAL HIGH (ref 24–37)

## 2015-09-26 MED ORDER — LOSARTAN POTASSIUM 25 MG PO TABS
25.0000 mg | ORAL_TABLET | Freq: Every day | ORAL | Status: DC
  Administered 2015-09-26: 25 mg via ORAL
  Filled 2015-09-26: qty 1

## 2015-09-26 MED ORDER — WARFARIN SODIUM 5 MG PO TABS: 5.0000 mg | ORAL_TABLET | Freq: Once | ORAL | Status: DC

## 2015-09-26 MED ORDER — WARFARIN SODIUM 7.5 MG PO TABS
7.5000 mg | ORAL_TABLET | Freq: Once | ORAL | Status: AC
  Administered 2015-09-26: 7.5 mg via ORAL
  Filled 2015-09-26: qty 1

## 2015-09-26 MED ORDER — LOSARTAN POTASSIUM 25 MG PO TABS: 25.0000 mg | ORAL_TABLET | Freq: Every day | ORAL | Status: DC

## 2015-09-26 MED ORDER — DM-GUAIFENESIN ER 30-600 MG PO TB12
1.0000 | ORAL_TABLET | Freq: Two times a day (BID) | ORAL | Status: DC | PRN
  Administered 2015-09-26 – 2015-09-30 (×3): 1 via ORAL
  Filled 2015-09-26 (×4): qty 1

## 2015-09-26 MED ORDER — BENZONATATE 100 MG PO CAPS
100.0000 mg | ORAL_CAPSULE | Freq: Three times a day (TID) | ORAL | Status: DC | PRN
  Administered 2015-09-26 – 2015-09-30 (×9): 100 mg via ORAL
  Filled 2015-09-26 (×9): qty 1

## 2015-09-26 MED ORDER — LOSARTAN POTASSIUM 50 MG PO TABS: 50.0000 mg | ORAL_TABLET | Freq: Every day | ORAL | Status: DC

## 2015-09-26 NOTE — Progress Notes (Signed)
CARDIAC REHAB PHASE I   PRE:  Rate/Rhythm: 113 ST  BP:  Supine:   Sitting: 137/66  Standing:    SaO2: 95 RA  MODE:  Ambulation: 700 ft   POST:  Rate/Rhythm: 122 ST  BP:  Supine:   Sitting: 139/70  Standing:    SaO2: 98 RA Tolerated ambulation well with rolling walker, requires assistance x 1 due to equipment.  I went over CHF education.  Patient can already repeat when to call the doctor regarding weight gains.  Discussed sodium restrictions, daily weights, taking medications as prescribed and referred to cardiac rehab phase II @ [facility] after discharge.  He has a fine red rash on his back and both legs, patient said nurse is aware and will bring to the MD's attention when they round.   [id number]   Cindra Eves RN, BSN 09/26/2015 12:44 PM

## 2015-09-26 NOTE — Progress Notes (Addendum)
Patient ID: James Bennett, male   DOB: 10/01/1995, 20 y.o.   MRN: [id number]    Advanced Heart Failure Rounding Note   Subjective:    Milrinone restarted on 2/25 due to low co-ox (30-40s). Failed repeat wean to 0.125. Now back on 0.25. Co-ox 65%. CVP 11  Had ultrasound + DVT on 2/27. Heparin stopped (had been hard to keep PTT therapeutic) and switched to bivalirudin and coumadin started.  RUE swelling has decreased.   Walked 700 feet today with rolling walker. Feeling stronger. Developed rash and fever last night.    ECHO 09/15/2015 EF 15% IVC dilated RV mildly decreased.   RHC Procedural Findings (2/23): Hemodynamics (mmHg) RA mean 19 RV 54/19 PA 51/32, mean 45 PCWP mean 40 Oxygen saturations: PA 57% AO 96% Cardiac Output (Fick) 6.02  Cardiac Index (Fick) 2.24 Cardiac Output (Thermo) 6.88 Cardiac Index (Thermo) 2.56  Objective:   Weight Range:  Vital Signs:   Temp:  [98.3 F (36.8 C)-101 F (38.3 C)] 98.3 F (36.8 C) (03/04 1140) Pulse Rate:  [124-132] 124 (03/04 1043) Resp:  [19-36] 21 (03/04 0700) BP: (122-157)/(49-76) 157/60 mmHg (03/04 0848) SpO2:  [94 %-98 %] 98 % (03/04 0750) Weight:  [125.102 kg (275 lb 12.8 oz)] 125.102 kg (275 lb 12.8 oz) (03/04 0600) Last BM Date: 09/25/15  Weight change: Filed Weights   09/24/15 0233 09/25/15 0500 09/26/15 0600  Weight: 131.226 kg (289 lb 4.8 oz) 128.323 kg (282 lb 14.4 oz) 125.102 kg (275 lb 12.8 oz)    Intake/Output:   Intake/Output Summary (Last 24 hours) at 09/26/15 1331 Last data filed at 09/26/15 1023  Gross per 24 hour  Intake 1622.8 ml  Output   5800 ml  Net -4177.2 ml     Physical Exam: CVP 11-12 General:  NAD.  HEENT: normal. [facility]  Neck: Thick. JVP difficult but appears elevated. Carotids 2+ bilat; no bruits. No lymphadenopathy or thryomegaly appreciated. Cor: PMI laterally displaced.  Tachy, regular rate & rhythm. +S3.  No murmur.  Lungs: clear anteriorly Abdomen: obese, soft, nontender,  nondistended. No hepatosplenomegaly. No bruits or masses. Good bowel sounds. Extremities: no cyanosis, clubbing.  1+ ankle edema. R and L toes purple.  RUE edema. Drug rash on lower legs Neuro: alert & orientedx3, cranial nerves grossly intact. moves all 4 extremities w/o difficulty. Affect pleasant  Telemetry: Reviewed personally, Remains in sinus tach 110-120s  Labs: Basic Metabolic Panel:  Recent Labs Lab 09/20/15 0530  09/21/15 1700 09/21/15 1816 09/22/15 0430 09/23/15 0430 09/24/15 0521 09/25/15 0400 09/26/15 0330  NA 136  < > 133*  --  133* 134* 134* 134* 132*  K 3.6  < > 3.7  --  3.9 4.1 3.8 4.4 3.5  CL 95*  < > 93*  --  96* 95* 96* 97* 96*  CO2 32  < > 29  --  29 28 28 28 26   GLUCOSE 93  < > 97  --  96 94 88 95 132*  BUN 21*  < > 18  --  15 13 12 12 16   CREATININE 0.89  < > 0.96  --  0.74 0.69 0.69 0.81 0.75  CALCIUM 7.4*  < > 8.1*  --  8.1* 8.6* 8.8* 8.8* 8.7*  MG 1.3*  --  1.5* 1.6* 2.0 1.8  --   --   --   < > = values in this interval not displayed.  Liver Function Tests:  Recent Labs Lab 09/20/15 0530 09/25/15 0400  AST 59* 48*  ALT 93* 60  ALKPHOS 74 87  BILITOT 3.3* 1.8*  PROT 5.1* 6.6  ALBUMIN 1.9* 2.5*   No results for input(s): LIPASE, AMYLASE in the last 168 hours. No results for input(s): AMMONIA in the last 168 hours.  CBC:  Recent Labs Lab 09/20/15 0530  09/22/15 0430 09/23/15 0430 09/24/15 1400 09/25/15 0400 09/26/15 0330  WBC 18.5*  < > 17.4* 17.0* 17.3* 17.5* 12.3*  NEUTROABS 15.0*  --   --   --  13.9* 13.7*  --   HGB 10.8*  < > 10.2* 10.1* 10.4* 10.2* 9.2*  HCT 33.4*  < > 32.5* 32.7* 33.3* 31.6* 30.0*  MCV 82.1  < > 81.3 81.8 82.0 82.1 81.7  PLT 163  < > 306 382 463* 444* 396  < > = values in this interval not displayed.  Cardiac Enzymes: No results for input(s): CKTOTAL, CKMB, CKMBINDEX, TROPONINI in the last 168 hours.  BNP: BNP (last 3 results)  Recent Labs  09/14/15 1610  BNP 1312.6*    ProBNP (last 3  results) No results for input(s): PROBNP in the last 8760 hours.    Other results:  Imaging: No results found.   Medications:     Scheduled Medications: . antiseptic oral rinse  7 mL Mouth Rinse BID  . citalopram  10 mg Oral Daily  . digoxin  0.25 mg Oral Daily  . famotidine  20 mg Oral BID  . furosemide  80 mg Intravenous 3 times per day  . ivabradine  7.5 mg Oral BID WC  . lisinopril  10 mg Oral QPM  . lisinopril  15 mg Oral QPC breakfast  . magnesium oxide  400 mg Oral BID  . potassium chloride  40 mEq Oral Daily  . sodium chloride flush  10-40 mL Intracatheter Q12H  . spironolactone  25 mg Oral Daily  . warfarin  5 mg Oral ONCE-1800  . Warfarin - Pharmacist Dosing Inpatient   Does not apply q1800    Infusions: . sodium chloride Stopped (09/23/15 0700)  . bivalirudin (ANGIOMAX) infusion 0.5 mg/mL (Non-ACS indications) 0.15 mg/kg/hr (09/26/15 1053)  . milrinone 0.25 mcg/kg/min (09/26/15 0848)    PRN Medications: sodium chloride, acetaminophen, ALPRAZolam, benzonatate, dextromethorphan-guaiFENesin, diphenhydrAMINE, hydrocortisone cream, ondansetron (ZOFRAN) IV, polyethylene glycol, sodium chloride flush, zolpidem   Assessment/Plan/ Discussion     1. Cardiogenic shock 2. Acute systolic CHF: EF 94% by echo with mildly dilated and mildly dysfunctional RV. Low output HF with marked volume overload initially.  Was on milrinone but able to titrate off initially.  [place] on 2/23 showed good cardiac output off milrinone but still very volume overloaded. Milrinone restarted 2/25 for recurrent profound shock.  Concern for viral myocarditis, has had viral-type symptoms for several weeks and has had antibiotic courses at home prior to admission. Coxsackie Antibody A + so suspect viral myocarditis (IgG rather than IgM, so not totally sure how recent exposure was). Co-ox down to 57% and more tachycardic on milrinone 0.125.  CVP up to 16-17.   - Milrinone back to 0.25, will likely  need to go home on this dose.  - Still volume overloaded. Continue Lasix 80 mg IV every 8 hrs at least one more day - Concerned he may need advanced therapies. He would be LVAD candidate and weight is now down in range where transplant would be feasible. RA/PCWP on cath < 0.50.  He will need home milrinone with attempt at slow weaning.  - Continue digoxin 0.25, level tomorrow.   -  On lisinopril 15 qam/10 qpm. Will switch to losartan 50/25. Try to get over to [facility] prior to d/c - Continue spironolactone 25 daily.  - Tolerating ivabradine, continue with sinus tachycardia.    - Would like to eventually get cardiac MRI but may be too large for MRI. - PICC placed 3/3 3. Acute respiratory failure: Suspect pulmonary edema plays a role but cannot rule out autoimmune pneumonitis process => serologies all negative so far. He is now off oxygen.  4. Rash:  -suspect initial rash was due to excoriation. Immune w/u normal. -now has clear drug rash on LEs. Offending agent unclear. Discussed with pharmacy team who is looking into it. 5. Ischemic toes: Suspect due to low flow/low cardiac output => think unlikely to be cardioembolic with symmetry. No LV thrombus noted on echo. He has dopplerable and now palpable lower extremity pulses.  Vascular has evaluated, will need followup with vascular as outpatient.   6. Elevated LFTs: Prominent elevated bilirubin. Suspect this is related to congestive hepatopathy. LFTs now down with hemodynamic support 7. ID: Had course of vancomycin/Zosyn, ? PNA component.  PCT was very elevated.  Cultures negative. Off antibiotics now. + Coxsackie A Antibody, suspect this is may be cause of viral myocarditis.   --Febrile overnight. WBC actually coming down. [place] drawn. NGTD --Had Foley in place so will check UA --Will follow. Leave off abx for now --PICC just placed. Central line out.  8. Rheum: ?Autoimmune process like vasculitis. Elevated CRP, normal ESR. Autoimmune serologies  all negative so far.Now off steroids.  9. AKI: Evaluated by renal, do not think there is autoimmune process involving kidneys, probably cardiorenal situation.  BUN/creatinine now normal with hemodynamic support. 10. Anxiety/depression: Celexa and prn Xanax started 2/25 11. Blood Type A+ 12. RUE DVT: Confirmed on Ultrasound 2/27. Was on heparin gtt but PTT had been low. Now on bivalirudin.   Hematology input appreciated. Platelets stable, unlikely HIT. 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  Coumadin started, INR 1.65 today will continue bivalirudin. Given that bival increases INR will keep bival going until INR 2.5-3.0 before stopping. Pharmacy dosing coumadin. We discussed dosing today. 13. NSVT:  He will need Lifevest when discharged since he will likely be on milrinone. It has been requested.    Glori Bickers MD 09/26/2015

## 2015-09-26 NOTE — Progress Notes (Signed)
Notified MD of HR 120's. Receiving corlanor during day without night coverage. Pt febrile and no other complaints. No orders at this time. Will continue to monitor.

## 2015-09-26 NOTE — Progress Notes (Addendum)
ANTICOAGULATION CONSULT NOTE - Follow Up Consult  Pharmacy Consult for Angiomax and warfarin Indication: DVT on IV heparin  No Known Allergies  Patient Measurements: Height: 6\' 2"  (188 cm) Weight: 275 lb 12.8 oz (125.102 kg) IBW/kg (Calculated) : 82.2  Vital Signs: Temp: 98.3 F (36.8 C) (03/04 1140) Temp Source: Oral (03/04 1140) BP: 157/60 mmHg (03/04 0848) Pulse Rate: 124 (03/04 1043)  Labs:  Recent Labs  09/24/15 0521  09/24/15 1400 09/25/15 0400 09/26/15 0330  HGB  --   < > 10.4* 10.2* 9.2*  HCT  --   --  33.3* 31.6* 30.0*  PLT  --   --  463* 444* 396  APTT 62*  --   --  56* 66*  LABPROT 19.0*  --   --  19.3* 19.5*  INR 1.59*  --   --  1.62* 1.65*  CREATININE 0.69  --   --  0.81 0.75  < > = values in this interval not displayed.  Estimated Creatinine Clearance: 207.1 mL/min (by C-G formula based on Cr of 0.75).   Assessment: 20 yo m with no PMH admitted on 2/20 with bilateral LE swelling x 1 week and SOB x 2.5 months.Found to have an EF of 15% and newly diagnosed cardiomyopathy. Initial LE doppler negative for DVT, no VQ or CT scan and ECHO neg for clot. Now upper extremity doppler is positive for acute thrombus in right Internal jugular vein and subclavian vein. Right cephalic vein superificial vein thrombus. Pharmacy is consulted to dose bivalirudin and warfarin.  Bivalirudin 0.15mg /kg/hr (wt140kg) all aPTTs have been in range (66 today)  INR 1.19 prior to bivalirudin started -may falsely elevate INR - INR today 1.65. titrating slowly   Goal of Therapy:  APTT = 50-85    INR 2-3 Monitor platelets by anticoagulation protocol: Yes   Plan:  1) Continue Bivalirudin 0.15mg /kg/hr  2) Warfarin 7.5mg  x1 tonight  3) Daily CBC, aPTT, INR  Laporche Martelle C. Marvis Moeller, PharmD Pharmacy Resident  Pager: [phone] 09/26/2015 12:38 PM

## 2015-09-27 ENCOUNTER — Inpatient Hospital Stay (HOSPITAL_COMMUNITY): Payer: BLUE CROSS/BLUE SHIELD

## 2015-09-27 LAB — CARBOXYHEMOGLOBIN
CARBOXYHEMOGLOBIN: 1.6 % — AB (ref 0.5–1.5)
Methemoglobin: 0.8 % (ref 0.0–1.5)
O2 SAT: 67.4 %
TOTAL HEMOGLOBIN: 9.2 g/dL — AB (ref 13.5–18.0)

## 2015-09-27 LAB — MAGNESIUM: Magnesium: 1.8 mg/dL (ref 1.7–2.4)

## 2015-09-27 LAB — APTT: APTT: 65 s — AB (ref 24–37)

## 2015-09-27 LAB — BASIC METABOLIC PANEL
Anion gap: 10 (ref 5–15)
BUN: 20 mg/dL (ref 6–20)
CHLORIDE: 95 mmol/L — AB (ref 101–111)
CO2: 26 mmol/L (ref 22–32)
CREATININE: 0.72 mg/dL (ref 0.61–1.24)
Calcium: 8.5 mg/dL — ABNORMAL LOW (ref 8.9–10.3)
GFR calc Af Amer: >60 mL/min (ref >60)
GLUCOSE: 130 mg/dL — AB (ref 65–99)
POTASSIUM: 3.9 mmol/L (ref 3.5–5.1)
SODIUM: 131 mmol/L — AB (ref 135–145)

## 2015-09-27 LAB — PROTIME-INR
INR: 1.53 — ABNORMAL HIGH (ref 0.00–1.49)
PROTHROMBIN TIME: 18.5 s — AB (ref 11.6–15.2)

## 2015-09-27 MED ORDER — TORSEMIDE 20 MG PO TABS
40.0000 mg | ORAL_TABLET | Freq: Two times a day (BID) | ORAL | Status: DC
  Administered 2015-09-27 – 2015-09-30 (×7): 40 mg via ORAL
  Filled 2015-09-27 (×7): qty 2

## 2015-09-27 MED ORDER — WARFARIN SODIUM 10 MG PO TABS
10.0000 mg | ORAL_TABLET | Freq: Once | ORAL | Status: AC
  Administered 2015-09-27: 10 mg via ORAL
  Filled 2015-09-27: qty 1

## 2015-09-27 MED ORDER — LOSARTAN POTASSIUM 50 MG PO TABS
50.0000 mg | ORAL_TABLET | Freq: Two times a day (BID) | ORAL | Status: DC
  Administered 2015-09-27 (×2): 50 mg via ORAL
  Filled 2015-09-27 (×2): qty 1

## 2015-09-27 MED ORDER — POTASSIUM CHLORIDE CRYS ER 10 MEQ PO TBCR
10.0000 meq | EXTENDED_RELEASE_TABLET | Freq: Once | ORAL | Status: AC
  Administered 2015-09-27: 10 meq via ORAL
  Filled 2015-09-27: qty 1

## 2015-09-27 MED ORDER — WARFARIN SODIUM 7.5 MG PO TABS: 7.5000 mg | ORAL_TABLET | Freq: Once | ORAL | Status: DC

## 2015-09-27 NOTE — Progress Notes (Addendum)
ANTICOAGULATION CONSULT NOTE - Follow Up Consult  Pharmacy Consult for Angiomax and warfarin Indication: DVT on IV heparin  No Known Allergies  Patient Measurements: Height: 6\' 2"  (188 cm) Weight: 270 lb 15.1 oz (122.9 kg) IBW/kg (Calculated) : 82.2  Vital Signs: Temp: 98.3 F (36.8 C) (03/05 0754) Temp Source: Oral (03/05 0754) BP: 129/54 mmHg (03/05 0754) Pulse Rate: 110 (03/05 0754)  Labs:  Recent Labs  09/24/15 1400 09/25/15 0400 09/26/15 0330 09/27/15 0325  HGB 10.4* 10.2* 9.2*  --   HCT 33.3* 31.6* 30.0*  --   PLT 463* 444* 396  --   APTT  --  56* 66* 65*  LABPROT  --  19.3* 19.5* 18.5*  INR  --  1.62* 1.65* 1.53*  CREATININE  --  0.81 0.75 0.72    Estimated Creatinine Clearance: 205.2 mL/min (by C-G formula based on Cr of 0.72).   Assessment: 20 yo m with no PMH admitted on 2/20 with bilateral LE swelling x 1 week and SOB x 2.5 months.Found to have an EF of 15% and newly diagnosed cardiomyopathy. Initial LE doppler negative for DVT, no VQ or CT scan and ECHO neg for clot. Now upper extremity doppler is positive for acute thrombus in right Internal jugular vein and subclavian vein. Right cephalic vein superificial vein thrombus. Pharmacy is consulted to dose bivalirudin and warfarin.  Bivalirudin 0.15mg /kg/hr, all aPTTs have been in range (65 today)  INR 1.19 prior to bivalirudin started -may falsely elevate INR - INR today 1.65>1.53. titrating slowly  CBC stable, no bleeding    Goal of Therapy:  APTT = 50-85    INR 2-3 Monitor platelets by anticoagulation protocol: Yes   Plan:  1) Continue Bivalirudin 0.15mg /kg/hr  2) Warfarin 10mg  x1 tonight  3) Daily CBC, aPTT, INR  Deadra Diggins C. Marvis Moeller, PharmD Pharmacy Resident  Pager: [phone] 09/27/2015 8:19 AM

## 2015-09-27 NOTE — Progress Notes (Addendum)
   09/26/15 2030  What Happened  Was fall witnessed? Yes  Who witnessed fall? Father Lamontre Memon )  Patients activity before fall bathroom-unassisted;to/from bed, chair, or stretcher  Point of contact buttocks  Was patient injured? No  Follow Up  MD notified Sullivan Lone  Time MD notified 2130  Family notified Yes-comment (Father witnessed fall)  Time family notified 2120  Additional tests No  Simple treatment Other (comment) (Rest)  Adult Fall Risk Assessment  Risk Factor Category (scoring not indicated) Not Applicable  Age 20  Fall History: Fall within 6 months prior to admission 5  Elimination; Bowel and/or Urine Incontinence 0  Elimination; Bowel and/or Urine Urgency/Frequency 0  Medications: includes PCA/Opiates, Anti-convulsants, Anti-hypertensives, Diuretics, Hypnotics, Laxatives, Sedatives, and Psychotropics 5  Patient Care Equipment 3  Mobility-Assistance 2  Mobility-Gait 0  Mobility-Sensory Deficit 0  Cognition-Awareness 0  Cognition-Impulsiveness 0  Cognition-Limitations 0  Total Score 15  Patient's Fall Risk High Fall Risk (>13 points)  Adult Fall Risk Interventions  Required Bundle Interventions *See Row Information* High fall risk - low, moderate, and high requirements implemented  Additional Interventions Appropriate bed frame use with air overlay;Fall risk signage  Fall with Injury Screening  Risk For Fall Injury- See Row Information  Nurse judgement (Fall during admission)  Intervention(s) for 2 or more risk criteria identified Gait Belt  Vitals  BP 125/62 mmHg  MAP (mmHg) 79  BP Location Left Leg  BP Method Automatic  Patient Position (if appropriate) Lying  ECG Heart Rate (!) 132  Cardiac Rhythm ST  New onset of dysrhythmia? No  Resp (!) 21  Oxygen Therapy  SpO2 96 %  O2 Device Room Air  Pain Assessment  Pain Assessment No/denies pain  PCA/Epidural/Spinal Assessment  Respiratory Pattern Regular;Unlabored;Symmetrical  Neurological  Neuro (WDL) WDL   Level of Consciousness Alert  Orientation Level Oriented X4  Speech Clear  Neuro Additional Assessments No  Glasgow Coma Scale  Eye Opening 4  Best Verbal Response (NON-intubated) 5  Best Motor Response 6  Glasgow Coma Scale Score 15  Musculoskeletal  Musculoskeletal (WDL) X  Assistive Device Front wheel walker  Generalized Weakness Yes  Weight Bearing Restrictions No  Integumentary  Integumentary (WDL) X  Skin Color Appropriate for ethnicity  Skin Condition Dry  Skin Integrity Intact;Rash  *Actual time of fall 2120 *Father, mother, and brother present at time of fall

## 2015-09-27 NOTE — Progress Notes (Signed)
Patient ID: James Bennett, male   DOB: [birth year], 20 y.o.   MRN: [id number]    Advanced Heart Failure Rounding Note   Subjective:    Milrinone restarted on 2/25 due to low co-ox (30-40s). Failed repeat wean to 0.125. Now back on milrinone 0.25. Co-ox 67%. CVP 10. HR slower in 100s this morning.  Had ultrasound + DVT on 2/27. Heparin stopped (had been hard to keep PTT therapeutic) and switched to bivalirudin and coumadin started.  RUE swelling has decreased.   Walked 900 feet today with rolling walker. Feeling stronger. Has new presumed drug rash on lower legs and low grade fever, 100 last night.  Blood cultures negative so far.    ECHO 09/15/2015 EF 15% IVC dilated RV mildly decreased.   [facility] Procedural Findings (2/23): Hemodynamics (mmHg) RA mean 19 RV 54/19 PA 51/32, mean 45 PCWP mean 40 Oxygen saturations: PA 57% AO 96% Cardiac Output (Fick) 6.02  Cardiac Index (Fick) 2.24 Cardiac Output (Thermo) 6.88 Cardiac Index (Thermo) 2.56  Objective:   Weight Range:  Vital Signs:   Temp:  [98.3 F (36.8 C)-100.8 F (38.2 C)] 98.3 F (36.8 C) (03/05 0754) Pulse Rate:  [107-124] 110 (03/05 0754) Resp:  [15-38] 28 (03/05 0754) BP: (125-157)/(54-85) 129/54 mmHg (03/05 0754) SpO2:  [96 %-100 %] 100 % (03/05 0754) Weight:  [270 lb 15.1 oz (122.9 kg)] 270 lb 15.1 oz (122.9 kg) (03/05 0333) Last BM Date: 09/25/15  Weight change: Filed Weights   09/25/15 0500 09/26/15 0600 09/27/15 0333  Weight: 282 lb 14.4 oz (128.323 kg) 275 lb 12.8 oz (125.102 kg) 270 lb 15.1 oz (122.9 kg)    Intake/Output:   Intake/Output Summary (Last 24 hours) at 09/27/15 0834 Last data filed at 09/27/15 0700  Gross per 24 hour  Intake 1541.7 ml  Output   2900 ml  Net -1358.3 ml     Physical Exam: CVP 10 General:  NAD.  HEENT: normal. LIJ  Neck: Thick. JVP 8-9 cm. Carotids 2+ bilat; no bruits. No lymphadenopathy or thryomegaly appreciated. Cor: PMI laterally displaced.  Tachy, regular rate & rhythm.  +S3.  No murmur.  Lungs: clear anteriorly Abdomen: obese, soft, nontender, nondistended. No hepatosplenomegaly. No bruits or masses. Good bowel sounds. Extremities: no cyanosis, clubbing.  1+ ankle edema. R and L toes purple.  RUE edema. Drug rash on lower legs Neuro: alert & orientedx3, cranial nerves grossly intact. moves all 4 extremities w/o difficulty. Affect pleasant  Telemetry: Reviewed personally, Remains in sinus tach 110-120s  Labs: Basic Metabolic Panel:  Recent Labs Lab 09/21/15 1700 09/21/15 1816 09/22/15 0430 09/23/15 0430 09/24/15 0521 09/25/15 0400 09/26/15 0330 09/27/15 0325  NA 133*  --  133* 134* 134* 134* 132* 131*  K 3.7  --  3.9 4.1 3.8 4.4 3.5 3.9  CL 93*  --  96* 95* 96* 97* 96* 95*  CO2 29  --  _0 GLUCOSE 97  --  96 94 88 95 132* 130*  BUN 18  --  _1 CREATININE 0.96  --  0.74 0.69 0.69 0.81 0.75 0.72  CALCIUM 8.1*  --  8.1* 8.6* 8.8* 8.8* 8.7* 8.5*  MG 1.5* 1.6* 2.0 1.8  --   --   --   --     Liver Function Tests:  Recent Labs Lab 09/25/15 0400  AST 48*  ALT 60  ALKPHOS 87  BILITOT 1.8*  PROT 6.6  ALBUMIN 2.5*  No results for input(s): LIPASE, AMYLASE in the last 168 hours. No results for input(s): AMMONIA in the last 168 hours.  CBC:  Recent Labs Lab 09/22/15 0430 09/23/15 0430 09/24/15 1400 09/25/15 0400 09/26/15 0330  WBC 17.4* 17.0* 17.3* 17.5* 12.3*  NEUTROABS  --   --  13.9* 13.7*  --   HGB 10.2* 10.1* 10.4* 10.2* 9.2*  HCT 32.5* 32.7* 33.3* 31.6* 30.0*  MCV 81.3 81.8 82.0 82.1 81.7  PLT 306 382 463* 444* 396    Cardiac Enzymes: No results for input(s): CKTOTAL, CKMB, CKMBINDEX, TROPONINI in the last 168 hours.  BNP: BNP (last 3 results)  Recent Labs  09/14/15 1610  BNP 1312.6*    ProBNP (last 3 results) No results for input(s): PROBNP in the last 8760 hours.    Other results:  Imaging: No results found.   Medications:     Scheduled Medications: . antiseptic oral  rinse  7 mL Mouth Rinse BID  . citalopram  10 mg Oral Daily  . digoxin  0.25 mg Oral Daily  . famotidine  20 mg Oral BID  . ivabradine  7.5 mg Oral BID WC  . losartan  50 mg Oral BID  . magnesium oxide  400 mg Oral BID  . potassium chloride  40 mEq Oral Daily  . sodium chloride flush  10-40 mL Intracatheter Q12H  . spironolactone  25 mg Oral Daily  . torsemide  40 mg Oral BID  . warfarin  10 mg Oral ONCE-1800  . Warfarin - Pharmacist Dosing Inpatient   Does not apply q1800    Infusions: . sodium chloride Stopped (09/23/15 0700)  . bivalirudin (ANGIOMAX) infusion 0.5 mg/mL (Non-ACS indications) 0.15 mg/kg/hr (09/27/15 0112)  . milrinone 0.25 mcg/kg/min (09/27/15 0327)    PRN Medications: sodium chloride, acetaminophen, ALPRAZolam, benzonatate, dextromethorphan-guaiFENesin, diphenhydrAMINE, hydrocortisone cream, ondansetron (ZOFRAN) IV, polyethylene glycol, sodium chloride flush, zolpidem   Assessment/Plan/ Discussion     1. Cardiogenic shock 2. Acute systolic CHF: EF 74% by echo with mildly dilated and mildly dysfunctional RV. Low output HF with marked volume overload initially.  Was on milrinone but able to titrate off initially.  [place] on 2/23 showed good cardiac output off milrinone but still very volume overloaded. Milrinone restarted 2/25 for recurrent profound shock.  Concern for viral myocarditis, has had viral-type symptoms for several weeks and has had antibiotic courses at home prior to admission. Coxsackie Antibody A + so suspect viral myocarditis (IgG rather than IgM, so not totally sure how recent exposure was). Co-ox 67%, CVP 10 today.  He did not tolerate wean to milrinone 0.125, have tried twice now.   - Milrinone back to 0.25, will likely need to go home on this dose.  - Volume status better, will switch to torsemide 40 mg bid, may be able to go home on once daily.  - Concerned he may need advanced therapies. He would be LVAD candidate and weight is now down in range  where transplant would be feasible. RA/PCWP on cath < 0.50.  He will need home milrinone with attempt at slow weaning.  - Continue digoxin 0.25, level ok.   - Increase losartan to 50 mg bid. If he tolerates ok, switch to [employer] 24/26 bid tomorrow. - Continue spironolactone 25 daily.  - Tolerating ivabradine 7.5 bid, continues with sinus tachycardia but rate slower.    - Will order cardiac MRI for tomorrow. - PICC placed 3/3 3. Acute respiratory failure: Suspect pulmonary edema plays a role but cannot rule  out autoimmune pneumonitis process => serologies all negative so far. He is now off oxygen.  4. Rash: Suspect initial rash was due to excoriation. Immune w/u normal.  Now has clear drug rash on LEs. Offending agent unclear. Discussed with pharmacy team who is looking into it. 5. Ischemic toes: Suspect due to low flow/low cardiac output => think unlikely to be cardioembolic with symmetry. No LV thrombus noted on echo. He has dopplerable and now palpable lower extremity pulses.  Vascular has evaluated, will need followup with vascular as outpatient.   6. Elevated LFTs: Prominent elevated bilirubin. Suspect this is related to congestive hepatopathy. LFTs now down with hemodynamic support.  7. ID: Had course of vancomycin/Zosyn, ? PNA component.  PCT was very elevated.  Cultures negative. Off antibiotics now. + Coxsackie A Antibody, suspect this is may be cause of viral myocarditis.  Low grade fever last night => UA negative, blood cultures so far negative, will get CXR. - Will follow. Leave off abx for now - PICC placed 3/3. Central line out.  8. Rheum: ?Autoimmune process like vasculitis. Elevated CRP, normal ESR. Autoimmune serologies all negative so far.Now off steroids.  9. AKI: Evaluated by renal, do not think there is autoimmune process involving kidneys, probably cardiorenal situation.  BUN/creatinine now normal with hemodynamic support. 10. Anxiety/depression: Celexa and prn Xanax  started 2/25 11. Blood Type A+ 12. RUE DVT: Confirmed on Ultrasound 2/27. Was on heparin gtt but PTT had been low. Now on bivalirudin.   Hematology input appreciated. Platelets stable, unlikely HIT. 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  Coumadin started, INR 1.5 today will continue bivalirudin. Think we can dose more aggressively now, discussed with pharmacy. 13. NSVT:  He will need Lifevest when discharged since he will likely be on milrinone. It has been requested.    Loralie Champagne MD 09/27/2015

## 2015-09-27 NOTE — Progress Notes (Signed)
Pt having increasing ventricular ectopy. Notified Dr. Sullivan Lone of 9 beat run of Vtach. Replaced potassium and magnesium add-on ordered for lab.

## 2015-09-28 ENCOUNTER — Inpatient Hospital Stay (HOSPITAL_COMMUNITY): Payer: BLUE CROSS/BLUE SHIELD

## 2015-09-28 LAB — COMPREHENSIVE METABOLIC PANEL
ALBUMIN: 2.5 g/dL — AB (ref 3.5–5.0)
ALT: 76 U/L — ABNORMAL HIGH (ref 17–63)
AST: 85 U/L — AB (ref 15–41)
Alkaline Phosphatase: 89 U/L (ref 38–126)
Anion gap: 12 (ref 5–15)
BILIRUBIN TOTAL: 1.3 mg/dL — AB (ref 0.3–1.2)
BUN: 20 mg/dL (ref 6–20)
CHLORIDE: 98 mmol/L — AB (ref 101–111)
CO2: 26 mmol/L (ref 22–32)
Calcium: 8.9 mg/dL (ref 8.9–10.3)
Creatinine, Ser: 0.81 mg/dL (ref 0.61–1.24)
GFR calc Af Amer: >60 mL/min (ref >60)
GFR calc non Af Amer: >60 mL/min (ref >60)
GLUCOSE: 88 mg/dL (ref 65–99)
POTASSIUM: 4.1 mmol/L (ref 3.5–5.1)
SODIUM: 136 mmol/L (ref 135–145)
Total Protein: 6.3 g/dL — ABNORMAL LOW (ref 6.5–8.1)

## 2015-09-28 LAB — CBC
HEMATOCRIT: 30.5 % — AB (ref 39.0–52.0)
Hemoglobin: 9.3 g/dL — ABNORMAL LOW (ref 13.0–17.0)
MCH: 24.9 pg — ABNORMAL LOW (ref 26.0–34.0)
MCHC: 30.5 g/dL (ref 30.0–36.0)
MCV: 81.6 fL (ref 78.0–100.0)
PLATELETS: 357 10*3/uL (ref 150–400)
RBC: 3.74 MIL/uL — ABNORMAL LOW (ref 4.22–5.81)
RDW: 20.5 % — AB (ref 11.5–15.5)
WBC: 7.7 10*3/uL (ref 4.0–10.5)

## 2015-09-28 LAB — SEROTONIN RELEASE ASSAY (SRA)
SRA .2 IU/mL UFH Ser-aCnc: 7 % (ref 0–20)
SRA 100IU/mL UFH Ser-aCnc: 3 % (ref 0–20)

## 2015-09-28 LAB — PROTIME-INR
INR: 1.63 — AB (ref 0.00–1.49)
Prothrombin Time: 19.4 seconds — ABNORMAL HIGH (ref 11.6–15.2)

## 2015-09-28 LAB — CARBOXYHEMOGLOBIN
Carboxyhemoglobin: 1.8 % — ABNORMAL HIGH (ref 0.5–1.5)
METHEMOGLOBIN: 0.7 % (ref 0.0–1.5)
O2 Saturation: 59.5 %
Total hemoglobin: 9.7 g/dL — ABNORMAL LOW (ref 13.5–18.0)

## 2015-09-28 LAB — URINE CULTURE: Culture: NO GROWTH

## 2015-09-28 LAB — APTT: aPTT: 63 seconds — ABNORMAL HIGH (ref 24–37)

## 2015-09-28 MED ORDER — SODIUM CHLORIDE 0.9 % IV SOLN
2000.0000 mg | Freq: Once | INTRAVENOUS | Status: AC
  Administered 2015-09-28: 2000 mg via INTRAVENOUS
  Filled 2015-09-28: qty 2000

## 2015-09-28 MED ORDER — DEXTROSE 5 % IV SOLN
1.0000 g | Freq: Three times a day (TID) | INTRAVENOUS | Status: DC
  Administered 2015-09-28 – 2015-10-01 (×10): 1 g via INTRAVENOUS
  Filled 2015-09-28 (×13): qty 1

## 2015-09-28 MED ORDER — DIAZEPAM 5 MG PO TABS
2.5000 mg | ORAL_TABLET | Freq: Once | ORAL | Status: AC | PRN
  Administered 2015-09-28: 2.5 mg via ORAL
  Filled 2015-09-28: qty 1

## 2015-09-28 MED ORDER — SACUBITRIL-VALSARTAN 24-26 MG PO TABS
1.0000 | ORAL_TABLET | Freq: Two times a day (BID) | ORAL | Status: DC
Start: 2015-09-28 — End: 2015-10-01
  Administered 2015-09-28 – 2015-10-01 (×7): 1 via ORAL
  Filled 2015-09-28 (×10): qty 1

## 2015-09-28 MED ORDER — GADOBENATE DIMEGLUMINE 529 MG/ML IV SOLN
36.0000 mL | Freq: Once | INTRAVENOUS | Status: AC | PRN
  Administered 2015-09-28: 36 mL via INTRAVENOUS

## 2015-09-28 MED ORDER — SODIUM CHLORIDE 0.9 % IV SOLN
1500.0000 mg | Freq: Two times a day (BID) | INTRAVENOUS | Status: DC
  Administered 2015-09-28 – 2015-10-01 (×6): 1500 mg via INTRAVENOUS
  Filled 2015-09-28 (×8): qty 1500

## 2015-09-28 MED ORDER — WARFARIN SODIUM 10 MG PO TABS
10.0000 mg | ORAL_TABLET | Freq: Once | ORAL | Status: AC
  Administered 2015-09-28: 10 mg via ORAL
  Filled 2015-09-28: qty 1

## 2015-09-28 NOTE — Progress Notes (Signed)
Orthopedic Tech Progress Note Patient Details:  James Bennett May 17, 1996 [id number]  Ortho Devices Type of Ortho Device: Darco shoe Ortho Device/Splint Location: Bilateral Darco shoes Ortho Device/Splint Interventions: Application   Saul Fordyce 09/28/2015, 3:42 PM

## 2015-09-28 NOTE — Progress Notes (Signed)
Occupational Therapy Treatment Patient Details Name: James Bennett MRN: [id number] DOB: 01-07-1996 Today's Date: 09/28/2015    History of present illness Patient is a 20 y/o male admitted with SOB and cough x 2.5 months and recent LE swelling and bluish color of toes.  Demonstrated 15% EF on echo with possible viral myocarditis. Did develop respiratory failure requiring bipap temporarily.   OT comments  Pt has met OT goals at this time and all education complete. Recommend HHOT for d/c planning. Pt with concerns with shoes due to toes at this time. Pt reports no sensation on 2nd -4th toes on bil LE. Pt with dark narotic appearance tissue on 2nd-4th toes bil LE.   MD please address shoes- PT recommending darco shoes  Follow Up Recommendations  Home health OT    Equipment Recommendations  Other (comment)    Recommendations for Other Services      Precautions / Restrictions Precautions Precautions: Fall Precaution Comments: watch HR, necrotic toes       Mobility Bed Mobility               General bed mobility comments: in chair already  Transfers Overall transfer level: Needs assistance Equipment used: Rolling walker (2 wheeled) Transfers: Sit to/from Stand Sit to Stand: Supervision         General transfer comment: able to power up from chair and control descend to chair this session    Balance             Standing balance-Leahy Scale: Good                     ADL Overall ADL's : Needs assistance/impaired                                 Tub/ Shower Transfer: Tub transfer;Supervision/safety;Rolling walker;Ambulation Tub/Shower Transfer Details (indicate cue type and reason): pt was able to step over the simulated tub transfer with shower seat. pt able to stand from shower seat. pt educated on sitting on seat and swinging feet in if feeling fatigued or risk of hitting toes at home with actual tub. pt has shower seat and standard walker.  Educated on practicing with clothes on first attempt to have family help and also to make sure to use a robe to help with transfers for modesty Functional mobility during ADLs: Supervision/safety;Rolling walker General ADL Comments: all education complete at this time for transfers. mom and patient with questions concerning shoes. PT recommending Darco shoe and awaiting more information from MD. PLEASE address SHOE with patient.      Vision                     Perception     Praxis      Cognition   Behavior During Therapy: WFL for tasks assessed/performed Overall Cognitive Status: Within Functional Limits for tasks assessed                       Extremity/Trunk Assessment               Exercises     Shoulder Instructions       General Comments      Pertinent Vitals/ Pain       Pain Assessment: No/denies pain  Home Living  Prior Functioning/Environment              Frequency Min 2X/week     Progress Toward Goals  OT Goals(current goals can now be found in the care plan section)  Progress towards OT goals: Progressing toward goals  Acute Rehab OT Goals Patient Stated Goal: go home OT Goal Formulation: With patient/family Time For Goal Achievement: 10/06/15 Potential to Achieve Goals: Good ADL Goals Pt Will Perform Grooming: with supervision;sitting Pt Will Perform Upper Body Bathing: with supervision;sitting Pt Will Perform Lower Body Bathing: with supervision;sit to/from stand Pt Will Transfer to Toilet: with supervision;ambulating;bedside commode Pt Will Perform Tub/Shower Transfer: with supervision;ambulating;shower seat;Tub transfer;rolling walker  Plan Discharge plan remains appropriate    Co-evaluation                 End of Session Equipment Utilized During Treatment: Gait belt;Rolling walker   Activity Tolerance Patient tolerated treatment well   Patient  Left in chair;with call bell/phone within reach   Nurse Communication Mobility status;Precautions        Time: 8366-2947 OT Time Calculation (min): 21 min  Charges:    Peri Maris 09/28/2015, 1:03 PM    Jeri Modena   OTR/L Pager: [phone] Office: [phone] .

## 2015-09-28 NOTE — Progress Notes (Signed)
Patient ID: James Bennett, male   DOB: 07/30/1995, 20 y.o.   MRN: [id number] Hematology: Serotonin release assay negative as expected. Do not list hepatin as an allergy in medical record. OK to use heparin prn

## 2015-09-28 NOTE — Progress Notes (Signed)
Patient ID: Benz Vandenberghe, male   DOB: August 03, 1995, 20 y.o.   MRN: [id number]    Advanced Heart Failure Rounding Note   Subjective:    Milrinone restarted on 2/25 due to low co-ox (30-40s). Failed repeat wean to 0.125.   Remains on milrinone 0.25. Co-ox 59.5%. CVP 9-10. HR in 110s this morning.  Had ultrasound + DVT on 2/27. Heparin stopped (had been hard to keep PTT therapeutic) and switched to bivalirudin and coumadin started.  RUE swelling has decreased.   Walked 900 x 2 and then an addition 400 feet yesterday with rolling walker. Developed presumed drug rash on lower legs and low grade fever, 100 09/26/15.  Blood NGTD.  Tmax 100.3.   ECHO 09/15/2015 EF 15% IVC dilated RV mildly decreased.   [facility] Procedural Findings (2/23): Hemodynamics (mmHg) RA mean 19 RV 54/19 PA 51/32, mean 45 PCWP mean 40 Oxygen saturations: PA 57% AO 96% Cardiac Output (Fick) 6.02  Cardiac Index (Fick) 2.24 Cardiac Output (Thermo) 6.88 Cardiac Index (Thermo) 2.56  Objective:   Weight Range:  Vital Signs:   Temp:  [98.3 F (36.8 C)-100.3 F (37.9 C)] 99.4 F (37.4 C) (03/06 0450) Pulse Rate:  [97-110] 109 (03/06 0450) Resp:  [15-28] 28 (03/06 0450) BP: (115-143)/(51-87) 126/53 mmHg (03/06 0450) SpO2:  [93 %-100 %] 99 % (03/06 0450) Weight:  [270 lb 1.6 oz (122.517 kg)] 270 lb 1.6 oz (122.517 kg) (03/06 0100) Last BM Date: 09/25/15  Weight change: Filed Weights   09/26/15 0600 09/27/15 0333 09/28/15 0100  Weight: 275 lb 12.8 oz (125.102 kg) 270 lb 15.1 oz (122.9 kg) 270 lb 1.6 oz (122.517 kg)    Intake/Output:   Intake/Output Summary (Last 24 hours) at 09/28/15 0714 Last data filed at 09/28/15 0600  Gross per 24 hour  Intake 2251.4 ml  Output   1550 ml  Net  701.4 ml     Physical Exam: CVP 9-10 General:  NAD.  HEENT: normal. LIJ  Neck: Thick. JVP 9-10 cm. Carotids 2+ bilat; no bruits. No thyromegaly or nodule noted. Cor: PMI laterally displaced.  Tachy, regular rate & rhythm. +S3.  No  murmur.  Lungs: clear anteriorly Abdomen: obese, soft, NT, ND, no HSM. No bruits or masses. +BS  Extremities: no cyanosis, clubbing.  1+ ankle edema. R and L toes purple.  RUE edema. Drug rash on lower legs Neuro: alert & orientedx3, cranial nerves grossly intact. moves all 4 extremities w/o difficulty. Affect pleasant  Telemetry: Reviewed personally, Sinus tach 110s  Labs: Basic Metabolic Panel:  Recent Labs Lab 09/21/15 1700 09/21/15 1816 09/22/15 0430 09/23/15 0430 09/24/15 0521 09/25/15 0400 09/26/15 0330 09/27/15 0325 09/27/15 0700 09/28/15 0525  NA 133*  --  133* 134* 134* 134* 132* 131*  --  136  K 3.7  --  3.9 4.1 3.8 4.4 3.5 3.9  --  4.1  CL 93*  --  96* 95* 96* 97* 96* 95*  --  98*  CO2 29  --  29 28 28 28 26 26   --  26  GLUCOSE 97  --  96 94 88 95 132* 130*  --  88  BUN 18  --  15 13 12 12 16 20   --  20  CREATININE 0.96  --  0.74 0.69 0.69 0.81 0.75 0.72  --  0.81  CALCIUM 8.1*  --  8.1* 8.6* 8.8* 8.8* 8.7* 8.5*  --  8.9  MG 1.5* 1.6* 2.0 1.8  --   --   --   --  1.8  --     Liver Function Tests:  Recent Labs Lab 09/25/15 0400 09/28/15 0525  AST 48* 85*  ALT 60 76*  ALKPHOS 87 89  BILITOT 1.8* 1.3*  PROT 6.6 6.3*  ALBUMIN 2.5* 2.5*   No results for input(s): LIPASE, AMYLASE in the last 168 hours. No results for input(s): AMMONIA in the last 168 hours.  CBC:  Recent Labs Lab 09/23/15 0430 09/24/15 1400 09/25/15 0400 09/26/15 0330 09/28/15 0525  WBC 17.0* 17.3* 17.5* 12.3* 7.7  NEUTROABS  --  13.9* 13.7*  --   --   HGB 10.1* 10.4* 10.2* 9.2* 9.3*  HCT 32.7* 33.3* 31.6* 30.0* 30.5*  MCV 81.8 82.0 82.1 81.7 81.6  PLT 382 463* 444* 396 357    Cardiac Enzymes: No results for input(s): CKTOTAL, CKMB, CKMBINDEX, TROPONINI in the last 168 hours.  BNP: BNP (last 3 results)  Recent Labs  09/14/15 1610  BNP 1312.6*    ProBNP (last 3 results) No results for input(s): PROBNP in the last 8760 hours.    Other results:  Imaging: Dg Chest  Port 1 View  09/27/2015  CLINICAL DATA:  pneumonia EXAM: PORTABLE CHEST 1 VIEW COMPARISON:  09/16/15 FINDINGS: Stable moderate cardiac enlargement. Vascular pattern normal. No effusions. Left lung clear. Infiltrate in the medial aspect of the right lower lobe. IMPRESSION: Right lower lobe pneumonia. Followup PA and lateral chest X-ray is recommended in 3-4 weeks following trial of antibiotic therapy to ensure resolution and exclude underlying malignancy. Electronically Signed   By: Skipper Cliche M.D.   On: 09/27/2015 11:57     Medications:     Scheduled Medications: . antiseptic oral rinse  7 mL Mouth Rinse BID  . citalopram  10 mg Oral Daily  . digoxin  0.25 mg Oral Daily  . famotidine  20 mg Oral BID  . ivabradine  7.5 mg Oral BID WC  . losartan  50 mg Oral BID  . magnesium oxide  400 mg Oral BID  . potassium chloride  40 mEq Oral Daily  . sodium chloride flush  10-40 mL Intracatheter Q12H  . spironolactone  25 mg Oral Daily  . torsemide  40 mg Oral BID  . Warfarin - Pharmacist Dosing Inpatient   Does not apply q1800    Infusions: . sodium chloride Stopped (09/23/15 0700)  . bivalirudin (ANGIOMAX) infusion 0.5 mg/mL (Non-ACS indications) 0.15 mg/kg/hr (09/28/15 0446)  . milrinone 0.25 mcg/kg/min (09/28/15 0041)    PRN Medications: sodium chloride, acetaminophen, ALPRAZolam, benzonatate, dextromethorphan-guaiFENesin, diphenhydrAMINE, hydrocortisone cream, ondansetron (ZOFRAN) IV, polyethylene glycol, sodium chloride flush, zolpidem   Assessment/Plan/ Discussion     1. Cardiogenic shock 2. Acute systolic CHF: EF 26% by echo with mildly dilated and mildly dysfunctional RV. Low output HF with marked volume overload initially.  Was on milrinone but able to titrate off initially.  [place] on 2/23 showed good cardiac output off milrinone but still very volume overloaded. Milrinone restarted 2/25 for recurrent profound shock.  Concern for viral myocarditis, has had viral-type symptoms  for several weeks and has had antibiotic courses at home prior to admission. Coxsackie Antibody A + so suspect viral myocarditis (IgG rather than IgM, so not totally sure how recent exposure was). Co-ox 59.5%, CVP 10 today.  Has failed milrinone wean x 2. Will go home on 0.25 with attempt at slow eaning.  - Continue torsemide 40 mg bid for now, may be able to go home on once daily.  - Concerned he may need advanced  therapies. He would be LVAD candidate and weight is now down in range where transplant would be feasible. RA/PCWP on cath < 0.50.    - Continue digoxin 0.25, level ok.   - Transition to Entresto 24/26 from losartan. (Last dose of lisinopril 0848 at 09/26/15) - Continue spironolactone 25 daily.  - Tolerating ivabradine 7.5 bid, continues with sinus tachycardia but rate slower.    - Cardiac MRI ordered for today.  - PICC placed 3/3 3. Acute respiratory failure: Suspect pulmonary edema plays a role but cannot rule out autoimmune pneumonitis process => serologies all negative so far. He is now off oxygen.  4. Rash: Suspect initial rash was due to excoriation. Immune w/u normal.  Now has clear drug rash on LEs.  - Offending agent remains unclear. Discussed with pharmacy team who is looking into it. 5. Ischemic toes: Suspect due to low flow/low cardiac output => think unlikely to be cardioembolic with symmetry. No LV thrombus noted on echo. He has dopplerable and now palpable lower extremity pulses.  Vascular has evaluated, will need followup with vascular as outpatient.   6. Elevated LFTs: Prominent elevated bilirubin. Suspect this is related to congestive hepatopathy. LFTs now down with hemodynamic support.  7. ID: Had course of vancomycin/Zosyn, ? PNA component.  PCT was very elevated.  Cultures negative. Off antibiotics now. + Coxsackie A Antibody, suspect this is may be cause of viral myocarditis.  Low grade fever 09/26/15 => UA negative, blood cultures so far negative. - CXR 09/27/15 with RLL  pneumonia. Recommended trial of ABX and follow-up imaging in 3-4 weeks. Will discuss ABX with MD and Pharm-D. - PICC placed 3/3. Central line out.  8. Rheum: ?Autoimmune process like vasculitis. Elevated CRP, normal ESR. Autoimmune serologies all negative so far.Now off steroids.  9. AKI: Evaluated by renal, do not think there is autoimmune process involving kidneys, probably cardiorenal situation.  BUN/creatinine now normal with hemodynamic support. 10. Anxiety/depression: Celexa and prn Xanax started 2/25 11. Blood Type A+ 12. RUE DVT: Confirmed on Ultrasound 2/27. Was on heparin gtt but PTT had been low. Now on bivalirudin.   Hematology input appreciated. Platelets stable, unlikely HIT. 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  Coumadin started, INR 1.63 today. Continue bivalirudin.  13. NSVT:  [facility] arranged on discharge. Will need with NSVT and using milrinone.   Satira Mccallum Tillery PA-C 09/28/2015   Advanced Heart Failure Team Pager [phone] (M-F; 7a - 4p)  Please contact [facility] for night-coverage after hours (4p -7a ) and weekends on [email or web address]  Patient seen with PA, agree with the above note.  Low grade fever, CXR with possible RLL PNA.  Will start vancomycin/cefepime for coverage for now.   Co-ox and CVP stable, continue current milrinone and torsemide.  Will stop losartan and start Entresto 24/26 bid today.   Cardiac MRI today.   INR still subtherapeutic and still awaiting HIT SRA.  Continue bivalirudin until therapeutic (will switch to Lovenox if SRA negative).    Continue to walk.  Home likely when INR therapeutic.   Loralie Champagne 09/28/2015 7:53 AM

## 2015-09-28 NOTE — Progress Notes (Signed)
CARDIAC REHAB PHASE I   PRE:  Rate/Rhythm: 115 ST  BP:  Supine:   Sitting: 125/78  Standing:    SaO2: 100%RA  MODE:  Ambulation: 520 ft   POST:  Rate/Rhythm: 126 ST  BP:  Supine:   Sitting: 135/88  Standing:    SaO2: 99%RA 1102-1140 Pt walked 520 ft on RA with rolling walker with steady gait. Has cough and notified RN who gave mucinex. Heart rate to 126. Back to recliner after walk. Tolerated well.    Luetta Nutting, RN BSN  09/28/2015 11:35 AM

## 2015-09-28 NOTE — Progress Notes (Signed)
Pharmacy Antibiotic Note  James Bennett is a 20 y.o. male who presented to [facility] admitted on 09/14/2015 with bilateral LE swelling (with noted discoloration of toes) for 1 week and SOB/cough for 2.5 months. Completed multiple courses of antibiotics prior to admission. Initially started on Vanc and Zosyn at admission but both were discontinued after negative cultures and clinical improvement. Now CXR with RLL pneumonia. Pharmacy consulted to dose Vanc and Cefepime.  Previously on vancomycin 1500 Q12H  Plan: 1. Vancomycin 2000 mg bolus, then 1500 mg Q12H 2. Cefepime 1 gram Q8H 3. Will continue to follow renal function, any culture results, LOT, and antibiotic de-escalation plans   Height: 6\' 2"  (188 cm) Weight: 270 lb 1.6 oz (122.517 kg) IBW/kg (Calculated) : 82.2  Temp (24hrs), Avg:99.8 F (37.7 C), Min:98.5 F (36.9 C), Max:101.3 F (38.5 C)   Recent Labs Lab 09/23/15 0430 09/24/15 0521 09/24/15 1400 09/25/15 0400 09/26/15 0330 09/27/15 0325 09/28/15 0525  WBC 17.0*  --  17.3* 17.5* 12.3*  --  7.7  CREATININE 0.69 0.69  --  0.81 0.75 0.72 0.81    Estimated Creatinine Clearance: 202.3 mL/min (by C-G formula based on Cr of 0.81).    No Known Allergies  Antimicrobials this admission: Zosyn 2/20 >> 2/27 Vanc 2/20 >> 2/27 Vanc 3/6 >> Cefepime 3/6 >>  Microbiology results: 2/20 UCx: neg 2/20 BCx: ngtd 2/20 sputum: no result  2/20 MRSA PCR neg 2/20 strep neg 2/20 legionella ip 2/20 Hep panel neg 2/20 HIV neg 2/24 Resp viral: neg  Coxsackie Ab A+  Thank you for allowing pharmacy to be a part of this patient's care.  [name], PharmD Clinical Pharmacy Resident Pager: [phone] 09/28/2015 8:29 AM

## 2015-09-28 NOTE — Progress Notes (Signed)
0900 Came to see to walk. Pt stated just walked about 500 ft with PT. Will follow up later and walk if not gone for MRI. Will continue to follow. Luetta Nutting RN BSN 09/28/2015 9:01 AM

## 2015-09-28 NOTE — Progress Notes (Signed)
Gave pt 20.00 per month copay card for corlanor (ivabradine), gave tp 30day free and 10.00 per month copay card for entesto. Will cont to follow

## 2015-09-28 NOTE — Progress Notes (Signed)
Physical Therapy Treatment Patient Details Name: James Bennett MRN: [id number] DOB: 1995/11/10 Today's Date: 09/28/2015    History of Present Illness Patient is a 20 y/o male admitted with SOB and cough x 2.5 months and recent LE swelling and bluish color of toes.  Demonstrated 15% EF on echo with possible viral myocarditis. Did develop respiratory failure requiring bipap temporarily.    PT Comments    Pt with improved ambulation tolerance and transfer mobility. Pt with necrosis of bilat toes in which the RN reports to be better. Pt with noted bilat LE rash and swelling as well. Will contact MD regarding pt using a [employer] shoe to off weight forefoot if he deems this necessary. Acute PT to con't to follow to progress mobility and increase endurance.  Follow Up Recommendations  Home health PT;Supervision/Assistance - 24 hour     Equipment Recommendations  Rolling walker with 5" wheels;3in1 (PT)    Recommendations for Other Services       Precautions / Restrictions Precautions Precautions: Fall Precaution Comments: watch HR, necrotic toes Restrictions Weight Bearing Restrictions: No    Mobility  Bed Mobility Overal bed mobility: Modified Independent Bed Mobility: Supine to Sit     Supine to sit: Supervision     General bed mobility comments: laid bed flat, increased time with use of hand rail, labored effort but able to complete without physical assist  Transfers Overall transfer level: Needs assistance Equipment used: Rolling walker (2 wheeled) Transfers: Sit to/from Stand Sit to Stand: Min guard         General transfer comment: HR into 130s, max 142 but returned to 118 within 30 sec of stopping  Ambulation/Gait Ambulation/Gait assistance: Min guard Ambulation Distance (Feet): 380 Feet Assistive device: Rolling walker (2 wheeled) Gait Pattern/deviations: Step-through pattern Gait velocity: slow Gait velocity interpretation: Below normal speed for age/gender General  Gait Details: pt walking on heels/ball of feet due to bilat necrotic toes however no physical assist needed. pt denies heart racing or SOB despite HR in 130s   Stairs            Wheelchair Mobility    Modified Rankin (Stroke Patients Only)       Balance Overall balance assessment: Needs assistance         Standing balance support: Single extremity supported Standing balance-Leahy Scale: Fair Standing balance comment: pt stood an used urinal without assist                    Cognition Arousal/Alertness: Awake/alert Behavior During Therapy: WFL for tasks assessed/performed Overall Cognitive Status: Within Functional Limits for tasks assessed                      Exercises Other Exercises Other Exercises: encouraged LE ther ex HEP    General Comments        Pertinent Vitals/Pain Pain Assessment: No/denies pain    Home Living                      Prior Function            PT Goals (current goals can now be found in the care plan section) Acute Rehab PT Goals Patient Stated Goal: go home Progress towards PT goals: Progressing toward goals    Frequency  Min 3X/week    PT Plan Current plan remains appropriate    Co-evaluation             End of  Session Equipment Utilized During Treatment: Gait belt Activity Tolerance: Patient tolerated treatment well Patient left: in chair;with call bell/phone within reach;with family/visitor present     Time: 0800-0820 PT Time Calculation (min) (ACUTE ONLY): 20 min  Charges:  $Gait Training: 8-22 mins                    G Codes:      James Bennett 09/28/2015, 9:15 AM   James Bennett, PT, DPT Pager #: [phone] Office #: [phone]

## 2015-09-28 NOTE — Progress Notes (Signed)
ANTICOAGULATION CONSULT NOTE - Follow Up Consult  Pharmacy Consult for Angiomax and warfarin Indication: DVT on IV heparin  No Known Allergies  Patient Measurements: Height: 6\' 2"  (188 cm) Weight: 270 lb 1.6 oz (122.517 kg) IBW/kg (Calculated) : 82.2  Vital Signs: Temp: 101.3 F (38.5 C) (03/06 0757) Temp Source: Oral (03/06 0757) BP: 121/52 mmHg (03/06 0757) Pulse Rate: 117 (03/06 0757)  Labs:  Recent Labs  09/26/15 0330 09/27/15 0325 09/28/15 0525  HGB 9.2*  --  9.3*  HCT 30.0*  --  30.5*  PLT 396  --  357  APTT 66* 65* 63*  LABPROT 19.5* 18.5* 19.4*  INR 1.65* 1.53* 1.63*  CREATININE 0.75 0.72 0.81    Estimated Creatinine Clearance: 202.3 mL/min (by C-G formula based on Cr of 0.81).   Assessment: 20 yo m with no PMH admitted on 2/20 with bilateral LE swelling x 1 week and SOB x 2.5 months.Found to have an EF of 15% and newly diagnosed cardiomyopathy. Initial LE doppler negative for DVT, no VQ or CT scan and ECHO neg for clot. Now upper extremity doppler is positive for acute thrombus in right Internal jugular vein and subclavian vein. Right cephalic vein superificial vein thrombus. Pharmacy is consulted to dose bivalirudin and warfarin.  Bivalirudin 0.15mg /kg/hr, all aPTTs have been in range (63 today)  INR 1.19 prior to bivalirudin started -may falsely elevate INR - INR today 1.65>1.53>1.63. titrating slowly  CBC stable, no bleeding    Goal of Therapy:  APTT = 50-85    INR 2-3 Monitor platelets by anticoagulation protocol: Yes   Plan:  1) Continue Bivalirudin 0.15mg /kg/hr  2) Warfarin 10mg  x1 tonight  3) Daily CBC, aPTT, INR   [name], PharmD Clinical Pharmacy Resident Pager: [phone] 09/28/2015 8:23 AM

## 2015-09-29 LAB — CBC
HCT: 31.7 % — ABNORMAL LOW (ref 39.0–52.0)
Hemoglobin: 9.7 g/dL — ABNORMAL LOW (ref 13.0–17.0)
MCH: 24.9 pg — AB (ref 26.0–34.0)
MCHC: 30.6 g/dL (ref 30.0–36.0)
MCV: 81.5 fL (ref 78.0–100.0)
PLATELETS: 325 10*3/uL (ref 150–400)
RBC: 3.89 MIL/uL — ABNORMAL LOW (ref 4.22–5.81)
RDW: 20.5 % — AB (ref 11.5–15.5)
WBC: 6.5 10*3/uL (ref 4.0–10.5)

## 2015-09-29 LAB — CARBOXYHEMOGLOBIN
CARBOXYHEMOGLOBIN: 1.7 % — AB (ref 0.5–1.5)
METHEMOGLOBIN: 0.7 % (ref 0.0–1.5)
O2 Saturation: 60.6 %
Total hemoglobin: 10.3 g/dL — ABNORMAL LOW (ref 13.5–18.0)

## 2015-09-29 LAB — BASIC METABOLIC PANEL
Anion gap: 9 (ref 5–15)
BUN: 17 mg/dL (ref 6–20)
CO2: 28 mmol/L (ref 22–32)
Calcium: 8.5 mg/dL — ABNORMAL LOW (ref 8.9–10.3)
Chloride: 98 mmol/L — ABNORMAL LOW (ref 101–111)
Creatinine, Ser: 0.71 mg/dL (ref 0.61–1.24)
GFR calc Af Amer: >60 mL/min (ref >60)
GFR calc non Af Amer: >60 mL/min (ref >60)
Glucose, Bld: 86 mg/dL (ref 65–99)
Potassium: 3.5 mmol/L (ref 3.5–5.1)
Sodium: 135 mmol/L (ref 135–145)

## 2015-09-29 LAB — PROTIME-INR
INR: 1.76 — ABNORMAL HIGH (ref 0.00–1.49)
Prothrombin Time: 20.5 seconds — ABNORMAL HIGH (ref 11.6–15.2)

## 2015-09-29 LAB — APTT: APTT: 74 s — AB (ref 24–37)

## 2015-09-29 MED ORDER — ENOXAPARIN SODIUM 120 MG/0.8ML ~~LOC~~ SOLN
120.0000 mg | Freq: Two times a day (BID) | SUBCUTANEOUS | Status: DC
  Administered 2015-09-29 – 2015-10-01 (×5): 120 mg via SUBCUTANEOUS
  Filled 2015-09-29: qty 0.8
  Filled 2015-09-29: qty 1
  Filled 2015-09-29: qty 0.8
  Filled 2015-09-29: qty 1
  Filled 2015-09-29 (×4): qty 0.8

## 2015-09-29 MED ORDER — WARFARIN SODIUM 10 MG PO TABS
12.5000 mg | ORAL_TABLET | Freq: Once | ORAL | Status: AC
  Administered 2015-09-29: 12.5 mg via ORAL
  Filled 2015-09-29 (×2): qty 1

## 2015-09-29 NOTE — Progress Notes (Signed)
Patient ID: James Bennett, male   DOB: 1996/06/25, 20 y.o.   MRN: [id number]    Advanced Heart Failure Rounding Note   Subjective:    Milrinone restarted on 2/25 due to low co-ox (30-40s). Failed repeat wean to 0.125.   Had ultrasound + DVT on 2/27. Heparin stopped (had been hard to keep PTT therapeutic) and switched to bivalirudin and coumadin started.  RUE swelling has decreased.   Complaining of cough. Denies SOB. Ambulating in the unit with PT. Todays CO-OX 61%.   ECHO 09/15/2015 EF 15% IVC dilated RV mildly decreased.   Cardiac MRI (3/6) with EF 29%, moderately dilated LV, mildly decreased RV systolic function, unable to assess for late gadolinium enhancement (poor quality contrast).   [facility] Procedural Findings (2/23): Hemodynamics (mmHg) RA mean 19 RV 54/19 PA 51/32, mean 45 PCWP mean 40 Oxygen saturations: PA 57% AO 96% Cardiac Output (Fick) 6.02  Cardiac Index (Fick) 2.24 Cardiac Output (Thermo) 6.88 Cardiac Index (Thermo) 2.56  Objective:   Weight Range:  Vital Signs:   Temp:  [98.6 F (37 C)-102.3 F (39.1 C)] 98.6 F (37 C) (03/07 0341) Pulse Rate:  [30-119] 30 (03/07 0200) Resp:  [16-33] 19 (03/07 0200) BP: (121-151)/(52-97) 128/80 mmHg (03/06 2351) SpO2:  [93 %-100 %] 93 % (03/07 0341) Weight:  [266 lb 8.6 oz (120.9 kg)] 266 lb 8.6 oz (120.9 kg) (03/07 0223) Last BM Date: 09/25/15  Weight change: Filed Weights   09/27/15 0333 09/28/15 0100 09/29/15 0223  Weight: 270 lb 15.1 oz (122.9 kg) 270 lb 1.6 oz (122.517 kg) 266 lb 8.6 oz (120.9 kg)    Intake/Output:   Intake/Output Summary (Last 24 hours) at 09/29/15 0726 Last data filed at 09/29/15 0200  Gross per 24 hour  Intake 2474.2 ml  Output   5250 ml  Net -2775.8 ml     Physical Exam: General:  NAD.  HEENT: normal. LIJ  Neck: Thick. JVP 6-7 cm. Carotids 2+ bilat; no bruits. No thyromegaly or nodule noted. Cor: PMI laterally displaced.  Tachy, regular rate & rhythm. +S3.  No murmur.  Lungs: clear  anteriorly Abdomen: obese, soft, NT, ND, no HSM. No bruits or masses. +BS  Extremities: no cyanosis, clubbing.  Trace edema. R and L toes purple.  RUE edema. Drug rash on lower legs Neuro: alert & orientedx3, cranial nerves grossly intact. moves all 4 extremities w/o difficulty. Affect pleasant  Telemetry: Reviewed personally, Sinus Rhythm -Tach 80-110s  Labs: Basic Metabolic Panel:  Recent Labs Lab 09/23/15 0430  09/25/15 0400 09/26/15 0330 09/27/15 0325 09/27/15 0700 09/28/15 0525 09/29/15 0530  NA 134*  < > 134* 132* 131*  --  136 135  K 4.1  < > 4.4 3.5 3.9  --  4.1 3.5  CL 95*  < > 97* 96* 95*  --  98* 98*  CO2 28  < > 28 26 26   --  26 28  GLUCOSE 94  < > 95 132* 130*  --  88 86  BUN 13  < > 12 16 20   --  20 17  CREATININE 0.69  < > 0.81 0.75 0.72  --  0.81 0.71  CALCIUM 8.6*  < > 8.8* 8.7* 8.5*  --  8.9 8.5*  MG 1.8  --   --   --   --  1.8  --   --   < > = values in this interval not displayed.  Liver Function Tests:  Recent Labs Lab 09/25/15 0400 09/28/15 0525  AST 48* 85*  ALT 60 76*  ALKPHOS 87 89  BILITOT 1.8* 1.3*  PROT 6.6 6.3*  ALBUMIN 2.5* 2.5*   No results for input(s): LIPASE, AMYLASE in the last 168 hours. No results for input(s): AMMONIA in the last 168 hours.  CBC:  Recent Labs Lab 09/24/15 1400 09/25/15 0400 09/26/15 0330 09/28/15 0525 09/29/15 0530  WBC 17.3* 17.5* 12.3* 7.7 6.5  NEUTROABS 13.9* 13.7*  --   --   --   HGB 10.4* 10.2* 9.2* 9.3* 9.7*  HCT 33.3* 31.6* 30.0* 30.5* 31.7*  MCV 82.0 82.1 81.7 81.6 81.5  PLT 463* 444* 396 357 325    Cardiac Enzymes: No results for input(s): CKTOTAL, CKMB, CKMBINDEX, TROPONINI in the last 168 hours.  BNP: BNP (last 3 results)  Recent Labs  09/14/15 1610  BNP 1312.6*    ProBNP (last 3 results) No results for input(s): PROBNP in the last 8760 hours.    Other results:  Imaging: Dg Chest Port 1 View  09/27/2015  CLINICAL DATA:  pneumonia EXAM: PORTABLE CHEST 1 VIEW COMPARISON:   09/16/15 FINDINGS: Stable moderate cardiac enlargement. Vascular pattern normal. No effusions. Left lung clear. Infiltrate in the medial aspect of the right lower lobe. IMPRESSION: Right lower lobe pneumonia. Followup PA and lateral chest X-ray is recommended in 3-4 weeks following trial of antibiotic therapy to ensure resolution and exclude underlying malignancy. Electronically Signed   By: Skipper Cliche M.D.   On: 09/27/2015 11:57   Mr Card Morphology Wo/w Cm  09/28/2015  CLINICAL DATA:  Cardiomyopathy of uncertain etiology. EXAM: CARDIAC MRI TECHNIQUE: The patient was scanned on a 1.5 Tesla GE magnet. A dedicated cardiac coil was used. Functional imaging was done using Fiesta sequences. 2,3, and 4 chamber views were done to assess for RWMA's. Modified Simpson's rule using a short axis stack was used to calculate an ejection fraction on a dedicated work Conservation officer, nature. The patient received 36 cc of Multihance. After 10 minutes inversion recovery sequences were used to assess for infiltration and scar tissue. CONTRAST:  36 cc Multihance FINDINGS: Limited images of lung fields showed a small left lower lower infiltrate. Moderately dilated left ventricle with normal wall thickness. Severe diffuse hypokinesis, EF 29%. The right ventricle was mildly dilated with mildly decreased systolic function. Normal right atrial size, mild left atrial enlargement. Moderate mitral regurgitation and moderate tricuspid regurgitation. Trileaflet aortic valve with no stenosis or regurgitation. Delayed enhancement images were difficult to interpret, unable to null the myocardium. MEASUREMENTS: MEASUREMENTS LV EDV 360 mL LV SV 105 mL LV EF 29% IMPRESSION: 1.  Possible LLL infiltrate (small). 2.  Moderate LV dilation with severe diffuse hypokinesis, EF 29%. 3.  Mildly dilated RV with mildly decreased systolic function. 4.  Moderate MR and moderate TR. 5. Delayed enhancement images were difficult to interpret, unable to  null myocardium. Dontray Haberland Electronically Signed   By: Loralie Champagne M.D.   On: 09/28/2015 18:26     Medications:     Scheduled Medications: . antiseptic oral rinse  7 mL Mouth Rinse BID  . ceFEPime (MAXIPIME) IV  1 g Intravenous 3 times per day  . citalopram  10 mg Oral Daily  . digoxin  0.25 mg Oral Daily  . famotidine  20 mg Oral BID  . ivabradine  7.5 mg Oral BID WC  . magnesium oxide  400 mg Oral BID  . potassium chloride  40 mEq Oral Daily  . sacubitril-valsartan  1 tablet Oral BID  .  sodium chloride flush  10-40 mL Intracatheter Q12H  . spironolactone  25 mg Oral Daily  . torsemide  40 mg Oral BID  . vancomycin  1,500 mg Intravenous Q12H  . Warfarin - Pharmacist Dosing Inpatient   Does not apply q1800    Infusions: . sodium chloride Stopped (09/23/15 0700)  . bivalirudin (ANGIOMAX) infusion 0.5 mg/mL (Non-ACS indications) 0.15 mg/kg/hr (09/29/15 0634)  . milrinone 0.25 mcg/kg/min (09/29/15 0659)    PRN Medications: sodium chloride, acetaminophen, ALPRAZolam, benzonatate, dextromethorphan-guaiFENesin, diphenhydrAMINE, hydrocortisone cream, ondansetron (ZOFRAN) IV, polyethylene glycol, sodium chloride flush, zolpidem   Assessment/Plan/ Discussion     1. Cardiogenic shock 2. Acute systolic CHF: EF 32% by echo with mildly dilated and mildly dysfunctional RV. Low output HF with marked volume overload initially.  Was on milrinone but able to titrate off initially.  [place] on 2/23 showed good cardiac output off milrinone but still very volume overloaded. Milrinone restarted 2/25 for recurrent profound shock.  Concern for viral myocarditis, has had viral-type symptoms for several weeks and has had antibiotic courses at home prior to admission. Coxsackie Antibody A + so suspect viral myocarditis (IgG rather than IgM, so not totally sure how recent exposure was).  cMRI with EF 29%, poor contrast study so not able to comment on infiltrative disease.  Co-ox 60%.  Failed milrinone  wean x2.  CVP 7 earlier today.  - Volume status stable. Continue torsemide 40 mg bid for now, may be able to go home on once daily.  - Concerned he may need advanced therapies. He would be LVAD candidate and weight is now down in range where transplant would be feasible. RA/PCWP on cath < 0.50.    - Continue digoxin 0.25, level ok.   - Continue Entresto 24/26 bid.  - Continue spironolactone 25 daily.  - Tolerating ivabradine 7.5 bid/  Heart Rate better.     3. Acute respiratory failure: Suspect pulmonary edema plays a role but cannot rule out autoimmune pneumonitis process => serologies all negative so far. He is now off oxygen.  4. Rash: Suspect initial rash was due to excoriation. Immune w/u normal.  Now has clear drug rash on LEs. Switched different coumadin strength. Febrile last night. ? Drug fever vs PNA.  5. Ischemic toes: Suspect due to low flow/low cardiac output => think unlikely to be cardioembolic with symmetry. No LV thrombus noted on echo. He has dopplerable and now palpable lower extremity pulses.  Vascular has evaluated, will need followup with vascular as outpatient.   6. Elevated LFTs: Prominent elevated bilirubin. Suspect this is related to congestive hepatopathy. LFTs now down with hemodynamic support. Check CMET tomorrow.  7. ID: Had course of vancomycin/Zosyn, ? PNA component.  PCT was very elevated.  Cultures negative. Off antibiotics now. + Coxsackie A Antibody, suspect this is may be cause of viral myocarditis.  Febrile last night. Cultures from 3/3 --> UA negative, blood cultures - NGTD.  - CXR 09/27/15 with RLL pneumonia. Day 2 of  Cefepime and Vancomycin. Continue antibiotics 7 days.  8. Rheum: ?Autoimmune process like vasculitis. Elevated CRP, normal ESR. Autoimmune serologies all negative so far.Now off steroids.  9. AKI: Evaluated by renal, do not think there is autoimmune process involving kidneys, probably cardiorenal situation.  BUN/creatinine now normal with  hemodynamic support. 10. Anxiety/depression: Celexa and prn Xanax started 2/25 11. Blood Type A+ 12. RUE DVT: Confirmed on Ultrasound 2/27. Was on heparin gtt but PTT had been low. Now on bivalirudin.   Hematology input appreciated.  Platelets stable, unlikely HIT. 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  Coumadin started, INR 1.76 today. Can stop bivalirudin and start Lovenox. 13. NSVT:  [facility] arranged on discharge. Will need with NSVT and using milrinone.   Amy Clegg NP-C  09/29/2015   Advanced Heart Failure Team Pager [phone] (M-F; 7a - 4p)  Please contact [facility] for night-coverage after hours (4p -7a ) and weekends on [email or web address]  Patient seen with NP, agree with the above note. Stable from CHF standpoint but still febrile => PNA versus drug fever (given cough and improvement in rash, may be from PNA).  - Continue vanco/cefepime, cultures negative so far.  - Continue milrinone and current diuretics.   HIT SRA negative.  Will stop bivalirudin and start Lovenox.    Hopefully home soon, need resolution of fevers.   Loralie Champagne 09/29/2015 7:51 AM

## 2015-09-29 NOTE — Progress Notes (Signed)
ANTICOAGULATION CONSULT NOTE - Follow Up Consult  Pharmacy Consult for Lovenox and warfarin Indication: DVT  No Known Allergies  Patient Measurements: Height: 6\' 2"  (188 cm) Weight: 266 lb 8.6 oz (120.9 kg) IBW/kg (Calculated) : 82.2  Vital Signs: Temp: 99.3 F (37.4 C) (03/07 0813) Temp Source: Oral (03/07 0813) BP: 142/73 mmHg (03/07 0813) Pulse Rate: 110 (03/07 0813)  Labs:  Recent Labs  09/27/15 0325 09/28/15 0525 09/29/15 0530  HGB  --  9.3* 9.7*  HCT  --  30.5* 31.7*  PLT  --  357 325  APTT 65* 63* 74*  LABPROT 18.5* 19.4* 20.5*  INR 1.53* 1.63* 1.76*  CREATININE 0.72 0.81 0.71    Estimated Creatinine Clearance: 203.5 mL/min (by C-G formula based on Cr of 0.71).   Assessment: 20 yo m with no PMH admitted on 2/20 with bilateral LE swelling x 1 week and SOB x 2.5 months.Found to have an EF of 15% and newly diagnosed cardiomyopathy. Initial LE doppler negative for DVT, no VQ or CT scan and ECHO neg for clot. Now upper extremity doppler is positive for acute thrombus in right Internal jugular vein and subclavian vein. Right cephalic vein superificial vein thrombus. Pharmacy is consulted to dose Lovenox and warfarin.  INR 1.19 prior to bivalirudin started -may falsely elevate INR - INR today 1.65>1.53>1.63>1.76. titrating slowly  CBC stable, no bleeding    Goal of Therapy:  Anti-Xa level 0.6-1 INR 2-3 Monitor platelets by anticoagulation protocol: Yes   Plan:  1) Lovenox 120 mg every 12 hours 2) Warfarin 12.5mg  x1 tonight  3) Daily CBC, aPTT, INR 4) anti-Xa level 3/8 at 1400 once at steady state   [name], PharmD Clinical Pharmacy Resident Pager: [phone] 09/29/2015 8:49 AM

## 2015-09-29 NOTE — Progress Notes (Signed)
Nutrition Follow-up   DOCUMENTATION CODES:   Obesity unspecified  INTERVENTION:  -Continue heart healthy diet and adequate PO intake  NUTRITION DIAGNOSIS:   Food and nutrition related knowledge deficit related to limited prior education as evidenced by other (see comment) (new diagnosis of CHF).  -Ongoing  GOAL:   Patient will meet greater than or equal to 90% of their needs  -Met   MONITOR:   PO intake, Labs, Weight trends, Skin, I & O's  REASON FOR ASSESSMENT:   Consult  (low albumin/prealbumin)  ASSESSMENT:   20 y.o. male presented with bilateral leg swelling and dyspnea for last 2 months not improved with steroids and antibiotics. He had lactic acidosis and leukocytosis   Pt states his appetite is well. Per chart pt is eating 50-100% of meals. He reports no questions about his diet.   Labs reviewed. Medications reviewed.   Diet Order:  Diet 2 gram sodium Room service appropriate?: Yes; Fluid consistency:: Thin; Fluid restriction:: 2000 mL Fluid  Skin:  Reviewed, no issues  Last BM:  09/28/15  Height:   Ht Readings from Last 1 Encounters:  09/20/15 6' 2"  (1.88 m)    Weight:   Wt Readings from Last 1 Encounters:  09/29/15 266 lb 8.6 oz (120.9 kg)    Ideal Body Weight:  86.3 kg  BMI:  Body mass index is 34.21 kg/(m^2).  Estimated Nutritional Needs:   Kcal:  2000-2200  Protein:  100-115 grams   Fluid:  >2.0 L  EDUCATION NEEDS:   Education needs addressed   Raford Pitcher, Dietetic Intern Pager: [phone]

## 2015-09-29 NOTE — Progress Notes (Signed)
CARDIAC REHAB PHASE I   PRE:  Rate/Rhythm: 117 ST  BP:  Supine:   Sitting: 135/71  Standing:    SaO2: 96%RA  MODE:  Ambulation: 520 ft   POST:  Rate/Rhythm: 125  BP:  Supine:   Sitting: 123/73  Standing:    SaO2: 99%RA 1125-1155 Put on darco boots before walk. Some difficulty standing but once up pt stated felt better walking with them. Used gait belt , rolling walker and asst x 1 to walk 520 ft. Pt tolerated well. Took boots off after walk. No DOE noted. To recliner.   Luetta Nutting, RN BSN  09/29/2015 11:51 AM

## 2015-09-30 DIAGNOSIS — I998 Other disorder of circulatory system: Secondary | ICD-10-CM

## 2015-09-30 LAB — JAK2 GENOTYPR

## 2015-09-30 LAB — COMPREHENSIVE METABOLIC PANEL
ALBUMIN: 2.6 g/dL — AB (ref 3.5–5.0)
ALT: 80 U/L — AB (ref 17–63)
AST: 70 U/L — ABNORMAL HIGH (ref 15–41)
Alkaline Phosphatase: 100 U/L (ref 38–126)
Anion gap: 12 (ref 5–15)
BUN: 14 mg/dL (ref 6–20)
CHLORIDE: 97 mmol/L — AB (ref 101–111)
CO2: 26 mmol/L (ref 22–32)
CREATININE: 0.7 mg/dL (ref 0.61–1.24)
Calcium: 8.7 mg/dL — ABNORMAL LOW (ref 8.9–10.3)
GFR calc non Af Amer: >60 mL/min (ref >60)
GLUCOSE: 81 mg/dL (ref 65–99)
Potassium: 3.7 mmol/L (ref 3.5–5.1)
SODIUM: 135 mmol/L (ref 135–145)
Total Bilirubin: 1 mg/dL (ref 0.3–1.2)
Total Protein: 7 g/dL (ref 6.5–8.1)

## 2015-09-30 LAB — CBC
HCT: 33.2 % — ABNORMAL LOW (ref 39.0–52.0)
HEMOGLOBIN: 10.7 g/dL — AB (ref 13.0–17.0)
MCH: 26.1 pg (ref 26.0–34.0)
MCHC: 32.2 g/dL (ref 30.0–36.0)
MCV: 81 fL (ref 78.0–100.0)
PLATELETS: 336 10*3/uL (ref 150–400)
RBC: 4.1 MIL/uL — AB (ref 4.22–5.81)
RDW: 20.2 % — ABNORMAL HIGH (ref 11.5–15.5)
WBC: 9.8 10*3/uL (ref 4.0–10.5)

## 2015-09-30 LAB — CULTURE, BLOOD (ROUTINE X 2)
CULTURE: NO GROWTH
Culture: NO GROWTH

## 2015-09-30 LAB — CARBOXYHEMOGLOBIN
Carboxyhemoglobin: 1.7 % — ABNORMAL HIGH (ref 0.5–1.5)
Methemoglobin: 0.7 % (ref 0.0–1.5)
O2 SAT: 64.3 %
Total hemoglobin: 9.9 g/dL — ABNORMAL LOW (ref 13.5–18.0)

## 2015-09-30 LAB — PROTIME-INR
INR: 1.29 (ref 0.00–1.49)
PROTHROMBIN TIME: 16.2 s — AB (ref 11.6–15.2)

## 2015-09-30 MED ORDER — WARFARIN SODIUM 7.5 MG PO TABS
15.0000 mg | ORAL_TABLET | Freq: Once | ORAL | Status: AC
  Administered 2015-09-30: 15 mg via ORAL
  Filled 2015-09-30: qty 2

## 2015-09-30 MED ORDER — ALTEPLASE 100 MG IV SOLR
2.0000 mg | Freq: Once | INTRAVENOUS | Status: AC
  Administered 2015-09-30: 2 mg
  Filled 2015-09-30: qty 2

## 2015-09-30 NOTE — Progress Notes (Signed)
Poss dc on 3-9. Alerted kellie w [facility] and they will fit today around 4:30p. Have alerted pam w [facility] who will hook up to home milrinone pump at [facility]. Everyone will be ready if pt gets to dc on 3-9.

## 2015-09-30 NOTE — Plan of Care (Signed)
Problem: Acute Rehab PT Goals(only PT should resolve) Goal: Pt Will Ambulate With no AD     

## 2015-09-30 NOTE — Progress Notes (Signed)
Physical Therapy Treatment Patient Details Name: James Bennett MRN: [id number] DOB: 1996/06/20 Today's Date: 09/30/2015    History of Present Illness Patient is a 20 y/o male admitted with SOB and cough x 2.5 months and recent LE swelling and bluish color of toes.  Demonstrated 15% EF on echo with possible viral myocarditis. Did develop respiratory failure requiring bipap temporarily.    PT Comments    Pt admitted with above diagnosis and presents with functional limitations due to decreased strength, balance, and cardiorespiratory status. Pt is very pleasant and participates well with PT. He is able to ambulate greater than 500 feet with a RW but has slight unsteadiness due to [employer]. Pt states that he would like to work on walking without the walker some also. Pt's HR in the 150's with ambulation but comes down with standing rest breaks. Pt has met 3/4 PT goals, and goals have been updated as appropriate.  Pt would benefit from continued PT services to increase independence with functional mobility. Also discussed updating d/c plan with the pt to recommend Outpatient PT so that he will get PT more frequently since he has progressed well with Acute PT. Pt will benefit from outpatient PT to continue to improve balance, strength, and endurance to gain functional independence.    Follow Up Recommendations  Outpatient PT;Supervision/Assistance - 24 hour     Equipment Recommendations  Rolling walker with 5" wheels;3in1 (PT)    Recommendations for Other Services       Precautions / Restrictions Precautions Precautions: Fall Precaution Comments: watch HR, necrotic toes Required Braces or Orthoses: Other Brace/Splint Other Brace/Splint: DARCO boots Restrictions Weight Bearing Restrictions: No    Mobility  Bed Mobility               General bed mobility comments: pt sitting EOB upon entry to room   Transfers Overall transfer level: Needs assistance Equipment used: Rolling walker  (2 wheeled) Transfers: Sit to/from Stand Sit to Stand: Min guard;From elevated surface         General transfer comment: Pt did not need physical assistance to stand but needed his feet blocked to prevent them from sliding with the darco boots on due.   Ambulation/Gait Ambulation/Gait assistance: Min guard Ambulation Distance (Feet):  (>500 feet) Assistive device: Rolling walker (2 wheeled) Gait Pattern/deviations: Step-through pattern;Decreased stride length;Trendelenburg;Trunk flexed;Narrow base of support Gait velocity: slow Gait velocity interpretation: Below normal speed for age/gender General Gait Details: Pt walked with [facility] boots on which slightly impair his balance. Pt is stable with the RW but has to widen his base of support for stability without the walker. Pt's endurance shortens significantly without the walker as well. Pt stated that he would like to try and walk more without the walker.    Stairs Stairs: Yes Stairs assistance: Mod assist Stair Management: With walker;Backwards;Step to pattern Number of Stairs: 2 General stair comments: Pt has 1 stair to go up at home without a rail so practiced going up one step backwards. Pt needed Mod A for stability to go up backwards due to Darco boots.   Wheelchair Mobility    Modified Rankin (Stroke Patients Only)       Balance Overall balance assessment: Needs assistance Sitting-balance support: No upper extremity supported Sitting balance-Leahy Scale: Normal     Standing balance support: No upper extremity supported Standing balance-Leahy Scale: Fair Standing balance comment: Pt seeks UE support  Cognition Arousal/Alertness: Awake/alert Behavior During Therapy: WFL for tasks assessed/performed Overall Cognitive Status: Within Functional Limits for tasks assessed                      Exercises      General Comments General comments (skin integrity, edema, etc.): Pt's toes  still purple/gray, but feet and leg swelling has improved. R LE has slightly more edema than L LE.       Pertinent Vitals/Pain Pain Assessment: No/denies pain  Resting HR: 128 HR with Activity: 142-154 HR post-activity: 138    Home Living                      Prior Function            PT Goals (current goals can now be found in the care plan section) Acute Rehab PT Goals Patient Stated Goal: to go home soon PT Goal Formulation: With patient Time For Goal Achievement: 10/15/15 Potential to Achieve Goals: Good Progress towards PT goals: Progressing toward goals    Frequency  Min 3X/week    PT Plan Discharge plan needs to be updated    Co-evaluation             End of Session Equipment Utilized During Treatment: Gait belt;Other (comment) (DARCO Boots) Activity Tolerance: Patient tolerated treatment well Patient left: in chair;with call bell/phone within reach;with family/visitor present     Time: 0932-0955 PT Time Calculation (min) (ACUTE ONLY): 23 min  Charges:  $Gait Training: 23-37 mins                    G Codes:      Jenna Floyd, SPT Jenna Floyd 09/30/2015, 1:14 PM   

## 2015-09-30 NOTE — Progress Notes (Addendum)
Patient ID: James Bennett, male   DOB: 05/05/1996, 20 y.o.   MRN: [id number]    Advanced Heart Failure Rounding Note   Subjective:   Ambulating in the hall with off loading shoes. Weight down another pound. Denies SOB/Orthopnea.   Milrinone restarted on 2/25 due to low co-ox (30-40s). Failed repeat wean to 0.125.   Had ultrasound + DVT on 2/27. Heparin stopped (had been hard to keep PTT therapeutic) and switched to bivalirudin and coumadin started.  RUE swelling has decreased.   ECHO 09/15/2015 EF 15% IVC dilated RV mildly decreased.   Cardiac MRI (3/6) with EF 29%, moderately dilated LV, mildly decreased RV systolic function, unable to assess for late gadolinium enhancement (poor quality contrast).   [facility] Procedural Findings (2/23): Hemodynamics (mmHg) RA mean 19 RV 54/19 PA 51/32, mean 45 PCWP mean 40 Oxygen saturations: PA 57% AO 96% Cardiac Output (Fick) 6.02  Cardiac Index (Fick) 2.24 Cardiac Output (Thermo) 6.88 Cardiac Index (Thermo) 2.56  Objective:   Weight Range:  Vital Signs:   Temp:  [98.6 F (37 C)-99.6 F (37.6 C)] 99 F (37.2 C) (03/08 0300) Pulse Rate:  [100-116] 100 (03/07 1611) Resp:  [18-30] 30 (03/08 0300) BP: (126-142)/(71-83) 141/71 mmHg (03/08 0300) SpO2:  [96 %-100 %] 97 % (03/08 0300) Weight:  [265 lb 12.8 oz (120.566 kg)] 265 lb 12.8 oz (120.566 kg) (03/08 0012) Last BM Date: 09/29/15  Weight change: Filed Weights   09/28/15 0100 09/29/15 0223 09/30/15 0012  Weight: 270 lb 1.6 oz (122.517 kg) 266 lb 8.6 oz (120.9 kg) 265 lb 12.8 oz (120.566 kg)    Intake/Output:   Intake/Output Summary (Last 24 hours) at 09/30/15 0708 Last data filed at 09/30/15 0600  Gross per 24 hour  Intake 2881.68 ml  Output   3350 ml  Net -468.32 ml     Physical Exam: General:  NAD. In bed.  HEENT: normal. LIJ  Neck: Thick. JVP 6-7 cm. Carotids 2+ bilat; no bruits. No thyromegaly or nodule noted. Cor: PMI laterally displaced.  Tachy, regular rate & rhythm.  +S3.  No murmur.  Lungs: clear anteriorly Abdomen: obese, soft, NT, ND, no HSM. No bruits or masses. +BS  Extremities: no cyanosis, clubbing.  No edema. R and L toes purple.  RUE edema. Drug rash on lower legs Neuro: alert & orientedx3, cranial nerves grossly intact. moves all 4 extremities w/o difficulty. Affect pleasant  Telemetry: Reviewed personally, Sinus Rhythm -Tach 80-110s  Labs: Basic Metabolic Panel:  Recent Labs Lab 09/25/15 0400 09/26/15 0330 09/27/15 0325 09/27/15 0700 09/28/15 0525 09/29/15 0530  NA 134* 132* 131*  --  136 135  K 4.4 3.5 3.9  --  4.1 3.5  CL 97* 96* 95*  --  98* 98*  CO2 28 26 26   --  26 28  GLUCOSE 95 132* 130*  --  88 86  BUN 12 16 20   --  20 17  CREATININE 0.81 0.75 0.72  --  0.81 0.71  CALCIUM 8.8* 8.7* 8.5*  --  8.9 8.5*  MG  --   --   --  1.8  --   --     Liver Function Tests:  Recent Labs Lab 09/25/15 0400 09/28/15 0525  AST 48* 85*  ALT 60 76*  ALKPHOS 87 89  BILITOT 1.8* 1.3*  PROT 6.6 6.3*  ALBUMIN 2.5* 2.5*   No results for input(s): LIPASE, AMYLASE in the last 168 hours. No results for input(s): AMMONIA in the last 168 hours.  CBC:  Recent Labs Lab 09/24/15 1400 09/25/15 0400 09/26/15 0330 09/28/15 0525 09/29/15 0530  WBC 17.3* 17.5* 12.3* 7.7 6.5  NEUTROABS 13.9* 13.7*  --   --   --   HGB 10.4* 10.2* 9.2* 9.3* 9.7*  HCT 33.3* 31.6* 30.0* 30.5* 31.7*  MCV 82.0 82.1 81.7 81.6 81.5  PLT 463* 444* 396 357 325    Cardiac Enzymes: No results for input(s): CKTOTAL, CKMB, CKMBINDEX, TROPONINI in the last 168 hours.  BNP: BNP (last 3 results)  Recent Labs  09/14/15 1610  BNP 1312.6*    ProBNP (last 3 results) No results for input(s): PROBNP in the last 8760 hours.    Other results:  Imaging: Mr Card Morphology Wo/w Cm  09/28/2015  CLINICAL DATA:  Cardiomyopathy of uncertain etiology. EXAM: CARDIAC MRI TECHNIQUE: The patient was scanned on a 1.5 Tesla GE magnet. A dedicated cardiac coil was used.  Functional imaging was done using Fiesta sequences. 2,3, and 4 chamber views were done to assess for RWMA's. Modified Simpson's rule using a short axis stack was used to calculate an ejection fraction on a dedicated work Conservation officer, nature. The patient received 36 cc of Multihance. After 10 minutes inversion recovery sequences were used to assess for infiltration and scar tissue. CONTRAST:  36 cc Multihance FINDINGS: Limited images of lung fields showed a small left lower lower infiltrate. Moderately dilated left ventricle with normal wall thickness. Severe diffuse hypokinesis, EF 29%. The right ventricle was mildly dilated with mildly decreased systolic function. Normal right atrial size, mild left atrial enlargement. Moderate mitral regurgitation and moderate tricuspid regurgitation. Trileaflet aortic valve with no stenosis or regurgitation. Delayed enhancement images were difficult to interpret, unable to null the myocardium. MEASUREMENTS: MEASUREMENTS LV EDV 360 mL LV SV 105 mL LV EF 29% IMPRESSION: 1.  Possible LLL infiltrate (small). 2.  Moderate LV dilation with severe diffuse hypokinesis, EF 29%. 3.  Mildly dilated RV with mildly decreased systolic function. 4.  Moderate MR and moderate TR. 5. Delayed enhancement images were difficult to interpret, unable to null myocardium. James Bennett Electronically Signed   By: Loralie Champagne M.D.   On: 09/28/2015 18:26     Medications:     Scheduled Medications: . alteplase  2 mg Intracatheter Once  . antiseptic oral rinse  7 mL Mouth Rinse BID  . ceFEPime (MAXIPIME) IV  1 g Intravenous 3 times per day  . citalopram  10 mg Oral Daily  . digoxin  0.25 mg Oral Daily  . enoxaparin (LOVENOX) injection  120 mg Subcutaneous Q12H  . famotidine  20 mg Oral BID  . ivabradine  7.5 mg Oral BID WC  . magnesium oxide  400 mg Oral BID  . potassium chloride  40 mEq Oral Daily  . sacubitril-valsartan  1 tablet Oral BID  . sodium chloride flush  10-40 mL  Intracatheter Q12H  . spironolactone  25 mg Oral Daily  . torsemide  40 mg Oral BID  . vancomycin  1,500 mg Intravenous Q12H  . Warfarin - Pharmacist Dosing Inpatient   Does not apply q1800    Infusions: . sodium chloride Stopped (09/23/15 0700)  . milrinone 0.25 mcg/kg/min (09/30/15 0356)    PRN Medications: sodium chloride, acetaminophen, ALPRAZolam, benzonatate, dextromethorphan-guaiFENesin, diphenhydrAMINE, hydrocortisone cream, ondansetron (ZOFRAN) IV, polyethylene glycol, sodium chloride flush, zolpidem   Assessment/Plan/ Discussion     1. Cardiogenic shock 2. Acute systolic CHF: EF 81% by echo with mildly dilated and mildly dysfunctional RV. Low output HF  with marked volume overload initially.  Was on milrinone but able to titrate off initially.  [place] on 2/23 showed good cardiac output off milrinone but still very volume overloaded. Milrinone restarted 2/25 for recurrent profound shock.  Concern for viral myocarditis, has had viral-type symptoms for several weeks and has had antibiotic courses at home prior to admission. Coxsackie Antibody A + so suspect viral myocarditis (IgG rather than IgM, so not totally sure how recent exposure was).  cMRI with EF 29%, poor contrast study so not able to comment on infiltrative disease.  Failed milrinone wean x2.   - Volume status stable. Continue torsemide 40 mg bid for now, may be able to go home on once daily. Weight down another pound. Overall he has diuresed 75 pounds. - Concerned he may need advanced therapies. He would be LVAD candidate and weight is now down in range where transplant would be feasible. RA/PCWP on cath < 0.50.    - Continue digoxin 0.25, level ok.   - BP on the higher side today, increase Entresto to 49/51 bid.   - Continue spironolactone 25 daily.  - Tolerating ivabradine 7.5 bid. Remains mildly tachycardic. .     3. Acute respiratory failure: Suspect pulmonary edema plays a role but cannot rule out autoimmune  pneumonitis process => serologies all negative so far. He is now off oxygen.  4. Rash: Suspect initial rash was due to excoriation. Immune w/u normal.  Now has clear drug rash on LEs. Switched different coumadin strength. Febrile last night. ? Drug fever vs PNA.  5. Ischemic toes: Suspect due to low flow/low cardiac output => think unlikely to be cardioembolic with symmetry. No LV thrombus noted on echo. He has dopplerable and now palpable lower extremity pulses.  Vascular has evaluated, will need followup with vascular as outpatient.   6. Elevated LFTs: Prominent elevated bilirubin. Suspect this is related to congestive hepatopathy. LFTs now down with hemodynamic support.   7. ID: Had course of vancomycin/Zosyn, ? PNA component.  PCT was very elevated.  Cultures negative. Off antibiotics now. + Coxsackie A Antibody, suspect this is may be cause of viral myocarditis.  Low grade temps persist. Cultures from 3/3 --> UA negative, blood cultures - NGTD.  - CXR 09/27/15 with RLL pneumonia. Day 3 of  Cefepime and Vancomycin. Continue antibiotics 7 days.  8. Rheum: ?Autoimmune process like vasculitis. Elevated CRP, normal ESR. Autoimmune serologies all negative so far.Now off steroids.  9. AKI: Evaluated by renal, do not think there is autoimmune process involving kidneys, probably cardiorenal situation.  BUN/creatinine now normal with hemodynamic support. 10. Anxiety/depression: Celexa and prn Xanax started 2/25 11. Blood Type A+ 12. RUE DVT: Confirmed on Ultrasound 2/27. Was on heparin gtt but PTT had been low. Now on bivalirudin.   Hematology input appreciated. Platelets stable, unlikely HIT. 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  Coumadin started, INR pending. Off bivallirudin and on Lovenox. 13. NSVT:  [facility] arranged on discharge. Will need with NSVT and using milrinone.   PICC clotted off. Lab called to obtain blood work.  [place] set up.[place] set up.   Amy Clegg NP-C    09/30/2015   Advanced Heart Failure Team Pager [phone] (M-F; 7a - 4p)  Please contact [facility] for night-coverage after hours (4p -7a ) and weekends on [email or web address]  Patient seen with NP, agree with the above note.  Doing well this morning.  Still waiting for labs.  BP is higher.  No fever  overnight.  - Continue empiric abx for PNA.  - Now on Lovenox/coumadin overlap, awaiting INR.  - Can increase Entresto to 49/51 bid.  - Volume status looks ok on torsemide, weight down about 75 lbs overall.  - Will go home on milrinone with [facility], plan slow wean off milrinone.   Hopefully home tomorrow.   Loralie Champagne 09/30/2015 8:42 AM

## 2015-09-30 NOTE — Progress Notes (Signed)
Subjective  -  Planning to go home tomorrow   Physical Exam:  Toes with slight improvement, starting to demarcate       Assessment/Plan:    Will schedule follow up in 2 weeks  [place], [place] 09/30/2015 3:19 PM --  Filed Vitals:   09/30/15 1200 09/30/15 1249  BP: 154/97   Pulse:  113  Temp:  98.3 F (36.8 C)  Resp: 26     Intake/Output Summary (Last 24 hours) at 09/30/15 1519 Last data filed at 09/30/15 1400  Gross per 24 hour  Intake 3384.48 ml  Output   4751 ml  Net -1366.52 ml     Laboratory CBC    Component Value Date/Time   WBC 9.8 09/30/2015 0759   HGB 10.7* 09/30/2015 0759   HCT 33.2* 09/30/2015 0759   PLT 336 09/30/2015 0759    BMET    Component Value Date/Time   NA 135 09/30/2015 0759   K 3.7 09/30/2015 0759   CL 97* 09/30/2015 0759   CO2 26 09/30/2015 0759   GLUCOSE 81 09/30/2015 0759   BUN 14 09/30/2015 0759   CREATININE 0.70 09/30/2015 0759   CALCIUM 8.7* 09/30/2015 0759   GFRNONAA >60 09/30/2015 0759   GFRAA >60 09/30/2015 0759    COAG Lab Results  Component Value Date   INR 1.29 09/30/2015   INR 1.76* 09/29/2015   INR 1.63* 09/28/2015   No results found for: PTT  Antibiotics Anti-infectives    Start     Dose/Rate Route Frequency Ordered Stop   09/28/15 2000  vancomycin (VANCOCIN) 1,500 mg in sodium chloride 0.9 % 500 mL IVPB     1,500 mg 250 mL/hr over 120 Minutes Intravenous Every 12 hours 09/28/15 0751     09/28/15 0800  vancomycin (VANCOCIN) 2,000 mg in sodium chloride 0.9 % 500 mL IVPB     2,000 mg 250 mL/hr over 120 Minutes Intravenous  Once 09/28/15 0751 09/28/15 1210   09/28/15 0800  ceFEPIme (MAXIPIME) 1 g in dextrose 5 % 50 mL IVPB     1 g 100 mL/hr over 30 Minutes Intravenous 3 times per day 09/28/15 0751     09/19/15 0007  vancomycin (VANCOCIN) 1,500 mg in sodium chloride 0.9 % 500 mL IVPB  Status:  Discontinued     1,500 mg 250 mL/hr over 120 Minutes Intravenous Every 12 hours 09/18/15 1325 09/21/15  0818   09/15/15 0800  vancomycin (VANCOCIN) 1,500 mg in sodium chloride 0.9 % 500 mL IVPB  Status:  Discontinued     1,500 mg 250 mL/hr over 120 Minutes Intravenous Every 8 hours 09/14/15 1816 09/18/15 1325   09/15/15 0100  piperacillin-tazobactam (ZOSYN) IVPB 3.375 g  Status:  Discontinued     3.375 g 12.5 mL/hr over 240 Minutes Intravenous Every 8 hours 09/14/15 1650 09/21/15 0818   09/14/15 1907  vancomycin (VANCOCIN) 500 MG powder    Comments:  Tommi Rumps   : cabinet override      09/14/15 1907 09/14/15 1910   09/14/15 1900  vancomycin (VANCOCIN) 500 mg in sodium chloride 0.9 % 100 mL IVPB  Status:  Discontinued     500 mg 100 mL/hr over 60 Minutes Intravenous  Once 09/14/15 1714 09/18/15 1325   09/14/15 1800  vancomycin (VANCOCIN) 1,500 mg in sodium chloride 0.9 % 500 mL IVPB  Status:  Discontinued     1,500 mg 250 mL/hr over 120 Minutes Intravenous  Once 09/14/15 1647 09/14/15 1713   09/14/15 1800  vancomycin (VANCOCIN) IVPB 1000 mg/200 mL premix     1,000 mg 200 mL/hr over 60 Minutes Intravenous  Once 09/14/15 1714 09/14/15 1909   09/14/15 1700  vancomycin (VANCOCIN) IVPB 1000 mg/200 mL premix     1,000 mg 200 mL/hr over 60 Minutes Intravenous  Once 09/14/15 1647 09/14/15 1812   09/14/15 1630  piperacillin-tazobactam (ZOSYN) IVPB 3.375 g     3.375 g 100 mL/hr over 30 Minutes Intravenous  Once 09/14/15 1620 09/14/15 1707   09/14/15 1630  vancomycin (VANCOCIN) IVPB 1000 mg/200 mL premix  Status:  Discontinued     1,000 mg 200 mL/hr over 60 Minutes Intravenous  Once 09/14/15 1620 09/14/15 1646       V. Charlena Cross, M.D. [facility] Office: [phone] Pager:  [phone]

## 2015-09-30 NOTE — Progress Notes (Signed)
CARDIAC REHAB PHASE I   PRE:  Rate/Rhythm: 133-139 ST in bathroom  BP:  Supine:   Sitting: 152/103 had leg down  Standing:    SaO2: 98%RA  MODE:  Ambulation: 520 ft   POST:  Rate/Rhythm: 142 ST  BP:  Supine:   Sitting: 136/77  Leg up  Standing:    SaO2: 100%RA 1115-1150 Waited for pt to finish in bathroom. Heart rate up when in bathroom. Put on boots and pt walked 520 ft with rolling walker. Heart rate to 142. To recliner after walk. Encouraged pt to walk at home three times a day and increase distance as tolerated. No c/o DOE.   Luetta Nutting, RN BSN  09/30/2015 11:45 AM

## 2015-09-30 NOTE — Progress Notes (Signed)
ANTICOAGULATION CONSULT NOTE - Follow Up Consult  Pharmacy Consult for Lovenox and warfarin Indication: DVT  No Known Allergies  Patient Measurements: Height: 6\' 2"  (188 cm) Weight: 265 lb 12.8 oz (120.566 kg) IBW/kg (Calculated) : 82.2  Vital Signs: Temp: 98.8 F (37.1 C) (03/08 0756) Temp Source: Oral (03/08 0756) BP: 160/80 mmHg (03/08 1000) Pulse Rate: 114 (03/08 0756)  Labs:  Recent Labs  09/28/15 0525 09/29/15 0530 09/30/15 0759  HGB 9.3* 9.7* 10.7*  HCT 30.5* 31.7* 33.2*  PLT 357 325 336  APTT 63* 74*  --   LABPROT 19.4* 20.5* 16.2*  INR 1.63* 1.76* 1.29  CREATININE 0.81 0.71 0.70    Estimated Creatinine Clearance: 203.3 mL/min (by C-G formula based on Cr of 0.7).   Assessment: 20 yo m with no PMH admitted on 2/20 with bilateral LE swelling x 1 week and SOB x 2.5 months.Found to have an EF of 15% and newly diagnosed cardiomyopathy. Initial LE doppler negative for DVT, no VQ or CT scan and ECHO neg for clot. Now upper extremity doppler is positive for acute thrombus in right Internal jugular vein and subclavian vein. Right cephalic vein superificial vein thrombus. Pharmacy is consulted to dose Lovenox and warfarin.  INR 1.19 prior to bivalirudin start -may falsely elevate INR - INR yesterday 1.76. Bival was d/c'd and INR fell to 1.29 today.  CBC stable, no bleeding   Goal of Therapy:  Anti-Xa level 0.6-1 INR 2-3 Monitor platelets by anticoagulation protocol: Yes   Plan:  1) Lovenox 120 mg every 12 hours 2) Warfarin 15mg  x1 tonight  3) Daily CBC, aPTT, INR   [name], PharmD Clinical Pharmacy Resident Pager: [phone] 09/30/2015 12:41 PM

## 2015-10-01 LAB — CBC
HCT: 30.6 % — ABNORMAL LOW (ref 39.0–52.0)
Hemoglobin: 9.8 g/dL — ABNORMAL LOW (ref 13.0–17.0)
MCH: 25.9 pg — ABNORMAL LOW (ref 26.0–34.0)
MCHC: 32 g/dL (ref 30.0–36.0)
MCV: 80.7 fL (ref 78.0–100.0)
Platelets: 339 10*3/uL (ref 150–400)
RBC: 3.79 MIL/uL — ABNORMAL LOW (ref 4.22–5.81)
RDW: 20.2 % — AB (ref 11.5–15.5)
WBC: 11 10*3/uL — AB (ref 4.0–10.5)

## 2015-10-01 LAB — PROTIME-INR
INR: 1.75 — AB (ref 0.00–1.49)
PROTHROMBIN TIME: 20.4 s — AB (ref 11.6–15.2)

## 2015-10-01 LAB — CARBOXYHEMOGLOBIN
CARBOXYHEMOGLOBIN: 1.1 % (ref 0.5–1.5)
Methemoglobin: 0.8 % (ref 0.0–1.5)
O2 SAT: 59.7 %
Total hemoglobin: 11.6 g/dL — ABNORMAL LOW (ref 13.5–18.0)

## 2015-10-01 LAB — BASIC METABOLIC PANEL
ANION GAP: 13 (ref 5–15)
BUN: 13 mg/dL (ref 6–20)
CALCIUM: 8.7 mg/dL — AB (ref 8.9–10.3)
CO2: 28 mmol/L (ref 22–32)
CREATININE: 0.74 mg/dL (ref 0.61–1.24)
Chloride: 93 mmol/L — ABNORMAL LOW (ref 101–111)
Glucose, Bld: 90 mg/dL (ref 65–99)
Potassium: 3.8 mmol/L (ref 3.5–5.1)
SODIUM: 134 mmol/L — AB (ref 135–145)

## 2015-10-01 MED ORDER — TORSEMIDE 20 MG PO TABS: 40.0000 mg | ORAL_TABLET | Freq: Every day | ORAL | Status: DC

## 2015-10-01 MED ORDER — HYDROCORTISONE 1 % EX CREA: TOPICAL_CREAM | CUTANEOUS | Status: DC | PRN

## 2015-10-01 MED ORDER — DIGOXIN 250 MCG PO TABS: 0.2500 mg | ORAL_TABLET | Freq: Every day | ORAL | Status: DC

## 2015-10-01 MED ORDER — MILRINONE IN DEXTROSE 20 MG/100ML IV SOLN: 0.2500 ug/kg/min | INTRAVENOUS | Status: DC

## 2015-10-01 MED ORDER — WARFARIN SODIUM 5 MG PO TABS: 2.5000 mg | ORAL_TABLET | Freq: Once | ORAL | Status: DC

## 2015-10-01 MED ORDER — ENOXAPARIN SODIUM 120 MG/0.8ML ~~LOC~~ SOLN: 120.0000 mg | Freq: Two times a day (BID) | SUBCUTANEOUS | Status: DC

## 2015-10-01 MED ORDER — POTASSIUM CHLORIDE CRYS ER 20 MEQ PO TBCR: 40.0000 meq | EXTENDED_RELEASE_TABLET | Freq: Every day | ORAL | Status: DC

## 2015-10-01 MED ORDER — SACUBITRIL-VALSARTAN 24-26 MG PO TABS: 1.0000 | ORAL_TABLET | Freq: Two times a day (BID) | ORAL | Status: DC

## 2015-10-01 MED ORDER — MAGNESIUM OXIDE 400 (241.3 MG) MG PO TABS: 400.0000 mg | ORAL_TABLET | Freq: Two times a day (BID) | ORAL | Status: DC

## 2015-10-01 MED ORDER — IVABRADINE HCL 7.5 MG PO TABS: 7.5000 mg | ORAL_TABLET | Freq: Two times a day (BID) | ORAL | Status: DC

## 2015-10-01 MED ORDER — BENZONATATE 100 MG PO CAPS: 100.0000 mg | ORAL_CAPSULE | Freq: Three times a day (TID) | ORAL | Status: DC | PRN

## 2015-10-01 MED ORDER — FAMOTIDINE 20 MG PO TABS: 20.0000 mg | ORAL_TABLET | Freq: Two times a day (BID) | ORAL | Status: DC

## 2015-10-01 MED ORDER — SPIRONOLACTONE 25 MG PO TABS: 25.0000 mg | ORAL_TABLET | Freq: Every day | ORAL | Status: DC

## 2015-10-01 MED ORDER — WARFARIN SODIUM 10 MG PO TABS: 10.0000 mg | ORAL_TABLET | Freq: Every day | ORAL | Status: DC

## 2015-10-01 MED ORDER — WARFARIN SODIUM 2.5 MG PO TABS
12.5000 mg | ORAL_TABLET | Freq: Once | ORAL | Status: AC
  Administered 2015-10-01: 12.5 mg via ORAL
  Filled 2015-10-01: qty 1

## 2015-10-01 MED ORDER — ACETAMINOPHEN 325 MG PO TABS: 650.0000 mg | ORAL_TABLET | ORAL | Status: AC | PRN

## 2015-10-01 MED ORDER — DOXYCYCLINE HYCLATE 100 MG PO TABS: 100.0000 mg | ORAL_TABLET | Freq: Two times a day (BID) | ORAL | Status: AC

## 2015-10-01 MED ORDER — CITALOPRAM HYDROBROMIDE 10 MG PO TABS: 10.0000 mg | ORAL_TABLET | Freq: Every day | ORAL | Status: DC

## 2015-10-01 MED ORDER — COUMADIN BOOK
Freq: Once | Status: DC
  Filled 2015-10-01: qty 1

## 2015-10-01 NOTE — Progress Notes (Signed)
I spoke with Rajat and his parents about all of his discharge medications. We also brought in a Lovenox and Coumadin practice set to explain how to administer these medications and what to monitor. Social work also assisted [place] in obtaining some discount cards. The family is overwhelmed by the medication load but are eager to get started with the treatment plan. I am not concerned about adherence at this time.

## 2015-10-01 NOTE — Progress Notes (Signed)
Patient ID: James Bennett, male   DOB: 12/09/1995, 20 y.o.   MRN: [id number] Hematology: Platelet count remains normal on lovenox. Still subtherapeutic on warfarin. WBC now normal JAK-2 mutation not detected (excludes most bcr-abl negative myeloproliferative disorders) Impression: #1.Viral myocarditis #2.CHF due to #1. #3.Congestive hepatopathy due to #2. #4. Ischemic digits of feet due to low C.O. #5. Initial elevated WBC due to tissue necrosis/ischemia of toes #6. RUE DVT pt believes arm swollen prior to hospitalization. This may have also occurred secondary to low flow state or inflammatory/SIRS from #1. #7. Transient, minimal drop in platelet count likely due to all of the above. PF4 ELISA positive but Serotonin release assay (more specific test) negative & low pre-test probability for HIT  With respect to duration of anticoagulation, given cardiac low flow state, and unprovoked nature of RUE DVT, I would tend to keep him on extended anticoagulation with periodic re-evaluation.

## 2015-10-01 NOTE — Discharge Summary (Signed)
Advanced Heart Failure Discharge Note   Discharge Summary   Patient ID: James Bennett MRN: [id number], DOB/AGE: [birth year] 20 y.o. Admit date: 09/14/2015 D/C date:     10/01/2015   Primary Discharge Diagnoses:  1. Cardiogenic shock. 2. Acute systolic CHF - EF 40% by echo, 29% by cMRI 3. Acute respiratory failure 4. LE Rash, presumed drug related 5. Ischemic toes 6. Transaminitis 7. AKI 8. Anxiety/depression 9. Serology = Blood Type A+ 10. RUE DVT - treated with Bival. Started on coumadin.  11. NSVT - Home with [facility].   Consults: CT surgery, Nephrology, Vascular surgery, Internal Medicine/Hematology  Hospital Course:   [name] is a 20 y.o. male who initially presented to [place] with complaints of gradual onset, constant, worsening, bilateral lower leg swelling x 1 week. Had been recently treated for acute bronchitis with no relief. Had been having worsening DOE and orthopnea. Also noted to have discoloration of his toes. When he presented he was tachycardic, lactic acid was 8.6, and WBC 20. CXR with multilobar PNA so he was admitted to [facility] 15M. Placed on vanc zosyn. HF team consulted with worsening DOE and orthopnea + LE edema.  Problem based HPI as below.    1. Cardiogenic shock - He was started on milrinone 0.25 with concerns for low output.  Was able to be titrated off prior to [place] which showed relatively stable CO off milrinone.  He decompensated and was placed back on 2/25 as below.  He failed wean a second time once stabilized.  Will require milrinone at least short term.  2. Acute systolic CHF: EF 98% by echo with mildly dilated and mildly dysfunctional RV. Low output HF with marked volume overload initially. Was on milrinone but able to titrate off initially. [place] on 2/23 showed good cardiac output off milrinone but still very volume overloaded. Milrinone restarted 2/25 for recurrent profound shock. Concern for viral myocarditis, has had viral-type symptoms for several  weeks and has had antibiotic courses at home prior to admission. Coxsackie Antibody A + so suspect viral myocarditis (IgG rather than IgM, so not totally sure how recent exposure was). cMRI with EF 29%, poor contrast study so not able to comment on infiltrative disease. Failed milrinone wean x2. CVP 5 this morning with co-ox 60%.  - He will go home on milrinone 0.25. Will plan slow wean over time. He is not on beta blocker due to low output. - Concerned he may need advanced therapies. He would be LVAD candidate and weight is now down in range where transplant would be feasible. RA/PCWP on cath < 0.50.   Remains mildly tachycardic, though tolerating ivabradine 7.5 mg BID.   3. Acute respiratory failure: Initially treated for multilobar PNA with improvement.  Suspect pulmonary edema plays a role but cannot rule out autoimmune pneumonitis process => serologies all negative so far. He is now off oxygen, will not require for home.  4. Rash: Initially thought to be 2/2 excoriation but progressed to clear drug rash with no clear inciting factor.  Thought to possible be due to dosage change of warfarin (dye in tabs).  Resolved without intervention.  5. Ischemic toes: Suspect due to low flow/low cardiac output => think unlikely to be cardioembolic with symmetry. No LV thrombus noted on echo. He has dopplerable and now palpable lower extremity pulses. Vascular has evaluated, will followup with vascular as outpatient in 2 wks.  6. Elevated LFTs: Prominent elevated bilirubin. Suspect this is related to congestive hepatopathy. LFTs improved with  hemodynamic support.  7. ID: Had course of vancomycin/Zosyn initially during stay, ? PNA component. PCT was very elevated. Cultures negative. Off antibiotics now. + Coxsackie A Antibody, suspect this is may be cause of viral myocarditis. He redeveloped fever, possible PNA on CXR. Cultures from 3/3 --> UA negative, blood cultures - NGTD.  - Now day 4 of  Cefepime and Vancomycin. Home on doxycycline 100 mg bid to complete 7 days.  8. Rheum: ?Autoimmune process like vasculitis. Elevated CRP, normal ESR. Autoimmune serologies all negative so far.Now off steroids.  9. AKI: Evaluated by renal, do not think there is autoimmune process involving kidneys, probably cardiorenal situation. BUN/creatinine normalized with hemodynamic support. 10. Anxiety/depression: Celexa and prn Xanax started 2/25 11. Blood Type A+ 12. RUE DVT: Confirmed on Ultrasound 2/27. Was on heparin gtt but PTT had been low. Now on bivalirudin. Hematology input appreciated. Platelets stable, unlikely HIT. 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 Coumadin started, INR 1.75 on day of discharge. Will bridge with lovenox and have close follow up with coumadin clinic. 13. NSVT: [facility] arranged on discharge. Will need with NSVT and using milrinone.   Overall he diuresed 37.9 L and was down 61 lbs from highest weight this admission.  He will go home in stable condition on milrinone with [facility] to provide [facility] and PT. He will have close follow up in the HF clinic as below, with bridging of his INR with lovenox shots, and close follow up in the Coumadin Clinic, Monday 10/05/15.  Discharge Weight Range: 262 lbs Discharge Vitals: Blood pressure 154/126, pulse 102, temperature 97.7 F (36.5 C), temperature source Oral, resp. rate 30, height 6' 2"  (1.88 m), weight 262 lb 9.6 oz (119.115 kg), SpO2 97 %.  Labs: Lab Results  Component Value Date   WBC 11.0* 10/01/2015   HGB 9.8* 10/01/2015   HCT 30.6* 10/01/2015   MCV 80.7 10/01/2015   PLT 339 10/01/2015     Recent Labs Lab 09/30/15 0759 10/01/15 0500  NA 135 134*  K 3.7 3.8  CL 97* 93*  CO2 26 28  BUN 14 13  CREATININE 0.70 0.74  CALCIUM 8.7* 8.7*  PROT 7.0  --   BILITOT 1.0  --   ALKPHOS 100  --   ALT 80*  --   AST 70*  --   GLUCOSE 81 90   Lab Results  Component Value Date   CHOL 151  09/21/2015   HDL 16* 09/21/2015   LDLCALC 99 09/21/2015   TRIG 178* 09/21/2015   BNP (last 3 results)  Recent Labs  09/14/15 1610  BNP 1312.6*    ProBNP (last 3 results) No results for input(s): PROBNP in the last 8760 hours.   Diagnostic Studies/Procedures   Cardiac MRI (3/6) with EF 29%, moderately dilated LV, mildly decreased RV systolic function, unable to assess for late gadolinium enhancement (poor quality contrast).   Echo 09/15/15 LVEF 15%, Severe diffuse hypokinesis, Mild MR, moderate LAE, mild RVH, RV mildly reduced, Moderate TR, PA peak pressure 48 mm Hg  Right Heart Catheterization 09/17/15 [facility] Procedural Findings: Hemodynamics (mmHg) RA mean 19 RV 54/19 PA 51/32, mean 45 PCWP mean 40  Oxygen saturations: PA 57% AO 96%  Cardiac Output (Fick) 6.02  Cardiac Index (Fick) 2.24  Cardiac Output (Thermo) 6.88 Cardiac Index (Thermo) 2.56  Discharge Medications     Medication List    TAKE these medications        acetaminophen 325 MG tablet  Commonly known  as:  TYLENOL  Take 2 tablets (650 mg total) by mouth every 4 (four) hours as needed for headache or mild pain.     benzonatate 100 MG capsule  Commonly known as:  TESSALON  Take 1 capsule (100 mg total) by mouth 3 (three) times daily as needed for cough.     citalopram 10 MG tablet  Commonly known as:  CELEXA  Take 1 tablet (10 mg total) by mouth daily.     digoxin 0.25 MG tablet  Commonly known as:  LANOXIN  Take 1 tablet (0.25 mg total) by mouth daily.     doxycycline 100 MG tablet  Commonly known as:  VIBRA-TABS  Take 1 tablet (100 mg total) by mouth 2 (two) times daily. 1 tablet tonight 10/01/15, and then twice daily until finished.     enoxaparin 120 MG/0.8ML injection  Commonly known as:  LOVENOX  Inject 0.8 mLs (120 mg total) into the skin every 12 (twelve) hours.     famotidine 20 MG tablet  Commonly known as:  PEPCID  Take 1 tablet (20 mg total) by mouth 2 (two) times daily.      hydrocortisone cream 1 %  Apply topically every 2 (two) hours as needed for itching.     ivabradine 7.5 MG Tabs tablet  Commonly known as:  CORLANOR  Take 1 tablet (7.5 mg total) by mouth 2 (two) times daily with a meal.     magnesium oxide 400 (241.3 Mg) MG tablet  Commonly known as:  MAG-OX  Take 1 tablet (400 mg total) by mouth 2 (two) times daily.     milrinone 20 MG/100ML Soln infusion  Commonly known as:  PRIMACOR  Inject 32.175 mcg/min into the vein continuous.     potassium chloride SA 20 MEQ tablet  Commonly known as:  K-DUR,KLOR-CON  Take 2 tablets (40 mEq total) by mouth daily.     sacubitril-valsartan 24-26 MG  Commonly known as:  ENTRESTO  Take 1 tablet by mouth 2 (two) times daily.     spironolactone 25 MG tablet  Commonly known as:  ALDACTONE  Take 1 tablet (25 mg total) by mouth daily.     torsemide 20 MG tablet  Commonly known as:  DEMADEX  Take 2 tablets (40 mg total) by mouth daily.  Start taking on:  10/02/2015     warfarin 5 MG tablet  Commonly known as:  COUMADIN  Take 0.5 tablets (2.5 mg total) by mouth one time only at 6 PM.     warfarin 10 MG tablet  Commonly known as:  COUMADIN  Take 1 tablet (10 mg total) by mouth daily.        Disposition   The patient will be discharged in stable condition to home with [facility] to provide milrinone, HHRN, and HHPT  Discharge Instructions    Diet - low sodium heart healthy    Complete by:  As directed      Heart Failure patients record your daily weight using the same scale at the same time of day    Complete by:  As directed      Increase activity slowly    Complete by:  As directed           Follow-up Information    Follow up with Annamarie Major, MD In 2 weeks.   Specialties:  Vascular Surgery, Cardiology   Why:  Office will call you to arrange your appt (sent)   Contact information:   [place]  [place] [address] [phone]       Follow up with Loralie Champagne, MD On 10/09/2015.    Specialty:  Cardiology   Why:  at 1030 for post hospital follow up. Please bring all of your medications to your visit. The code for patient parking is 0010.   Contact information:   [place] [address] [phone]       Follow up with [facility].   Specialty:  Cardiology   Why:  at 1030 for coumadin/INR check.    Contact information:   [address] [place] [phone]        Duration of Discharge Encounter: Greater than 35 minutes   Signed, Annamaria Helling 10/01/2015, 5:24 PM

## 2015-10-01 NOTE — Progress Notes (Addendum)
Patient ID: Wilbur Oakland, male   DOB: [birth year], 20 y.o.   MRN: [id number]    Advanced Heart Failure Rounding Note   Subjective:   Ambulating in the hall with off loading shoes. Weight continues to fall. Denies SOB/Orthopnea. No lightheadedness.  Generally feels good.  No further fevers.  CVP 5, co-ox 60%.   Milrinone restarted on 2/25 due to low co-ox (30-40s). Failed repeat wean to 0.125.   Had ultrasound + DVT on 2/27. Heparin stopped (had been hard to keep PTT therapeutic) and switched to bivalirudin and coumadin started.  RUE swelling has decreased.  HIT negative so now on Lovenox injections.   ECHO 09/15/2015 EF 15% IVC dilated RV mildly decreased.   Cardiac MRI (3/6) with EF 29%, moderately dilated LV, mildly decreased RV systolic function, unable to assess for late gadolinium enhancement (poor quality contrast).   [facility] Procedural Findings (2/23): Hemodynamics (mmHg) RA mean 19 RV 54/19 PA 51/32, mean 45 PCWP mean 40 Oxygen saturations: PA 57% AO 96% Cardiac Output (Fick) 6.02  Cardiac Index (Fick) 2.24 Cardiac Output (Thermo) 6.88 Cardiac Index (Thermo) 2.56  Objective:   Weight Range:  Vital Signs:   Temp:  [98.3 F (36.8 C)-100.3 F (37.9 C)] 98.9 F (37.2 C) (03/09 0728) Pulse Rate:  [100-114] 102 (03/09 0422) Resp:  [13-33] 33 (03/09 0422) BP: (99-160)/(41-97) 108/41 mmHg (03/09 0422) SpO2:  [94 %-100 %] 95 % (03/09 0728) Weight:  [262 lb 9.6 oz (119.115 kg)] 262 lb 9.6 oz (119.115 kg) (03/09 0422) Last BM Date: 09/30/15  Weight change: Filed Weights   09/29/15 0223 09/30/15 0012 10/01/15 0422  Weight: 266 lb 8.6 oz (120.9 kg) 265 lb 12.8 oz (120.566 kg) 262 lb 9.6 oz (119.115 kg)    Intake/Output:   Intake/Output Summary (Last 24 hours) at 10/01/15 0753 Last data filed at 10/01/15 0600  Gross per 24 hour  Intake 2197.1 ml  Output   4476 ml  Net -2278.9 ml     Physical Exam: General:  NAD. In bed.  HEENT: normal. LIJ  Neck: Thick. JVP 6-7 cm.  Carotids 2+ bilat; no bruits. No thyromegaly or nodule noted. Cor: PMI laterally displaced.  Tachy, regular rate & rhythm. +S3.  No murmur.  Lungs: clear anteriorly Abdomen: obese, soft, NT, ND, no HSM. No bruits or masses. +BS  Extremities: no cyanosis, clubbing.  No edema. R and L toes purple.  RUE edema. Drug rash on lower legs Neuro: alert & orientedx3, cranial nerves grossly intact. moves all 4 extremities w/o difficulty. Affect pleasant  Telemetry: Reviewed personally, Sinus Rhythm -Tach 110s  Labs: Basic Metabolic Panel:  Recent Labs Lab 09/27/15 0325 09/27/15 0700 09/28/15 0525 09/29/15 0530 09/30/15 0759 10/01/15 0500  NA 131*  --  136 135 135 134*  K 3.9  --  4.1 3.5 3.7 3.8  CL 95*  --  98* 98* 97* 93*  CO2 26  --  26 28 26 28   GLUCOSE 130*  --  88 86 81 90  BUN 20  --  20 17 14 13   CREATININE 0.72  --  0.81 0.71 0.70 0.74  CALCIUM 8.5*  --  8.9 8.5* 8.7* 8.7*  MG  --  1.8  --   --   --   --     Liver Function Tests:  Recent Labs Lab 09/25/15 0400 09/28/15 0525 09/30/15 0759  AST 48* 85* 70*  ALT 60 76* 80*  ALKPHOS 87 89 100  BILITOT 1.8* 1.3* 1.0  PROT  6.6 6.3* 7.0  ALBUMIN 2.5* 2.5* 2.6*   No results for input(s): LIPASE, AMYLASE in the last 168 hours. No results for input(s): AMMONIA in the last 168 hours.  CBC:  Recent Labs Lab 09/24/15 1400 09/25/15 0400 09/26/15 0330 09/28/15 0525 09/29/15 0530 09/30/15 0759 10/01/15 0500  WBC 17.3* 17.5* 12.3* 7.7 6.5 9.8 11.0*  NEUTROABS 13.9* 13.7*  --   --   --   --   --   HGB 10.4* 10.2* 9.2* 9.3* 9.7* 10.7* 9.8*  HCT 33.3* 31.6* 30.0* 30.5* 31.7* 33.2* 30.6*  MCV 82.0 82.1 81.7 81.6 81.5 81.0 80.7  PLT 463* 444* 396 357 325 336 339    Cardiac Enzymes: No results for input(s): CKTOTAL, CKMB, CKMBINDEX, TROPONINI in the last 168 hours.  BNP: BNP (last 3 results)  Recent Labs  09/14/15 1610  BNP 1312.6*    ProBNP (last 3 results) No results for input(s): PROBNP in the last 8760  hours.    Other results:  Imaging: No results found.   Medications:     Scheduled Medications: . antiseptic oral rinse  7 mL Mouth Rinse BID  . ceFEPime (MAXIPIME) IV  1 g Intravenous 3 times per day  . citalopram  10 mg Oral Daily  . digoxin  0.25 mg Oral Daily  . enoxaparin (LOVENOX) injection  120 mg Subcutaneous Q12H  . famotidine  20 mg Oral BID  . ivabradine  7.5 mg Oral BID WC  . magnesium oxide  400 mg Oral BID  . potassium chloride  40 mEq Oral Daily  . sacubitril-valsartan  1 tablet Oral BID  . sodium chloride flush  10-40 mL Intracatheter Q12H  . spironolactone  25 mg Oral Daily  . vancomycin  1,500 mg Intravenous Q12H  . Warfarin - Pharmacist Dosing Inpatient   Does not apply q1800    Infusions: . sodium chloride Stopped (09/23/15 0700)  . milrinone 0.25 mcg/kg/min (10/01/15 0240)    PRN Medications: sodium chloride, acetaminophen, ALPRAZolam, benzonatate, dextromethorphan-guaiFENesin, diphenhydrAMINE, hydrocortisone cream, ondansetron (ZOFRAN) IV, polyethylene glycol, sodium chloride flush, zolpidem   Assessment/Plan/ Discussion     1. Cardiogenic shock 2. Acute systolic CHF: EF 82% by echo with mildly dilated and mildly dysfunctional RV. Low output HF with marked volume overload initially.  Was on milrinone but able to titrate off initially.  [place] on 2/23 showed good cardiac output off milrinone but still very volume overloaded. Milrinone restarted 2/25 for recurrent profound shock.  Concern for viral myocarditis, has had viral-type symptoms for several weeks and has had antibiotic courses at home prior to admission. Coxsackie Antibody A + so suspect viral myocarditis (IgG rather than IgM, so not totally sure how recent exposure was).  cMRI with EF 29%, poor contrast study so not able to comment on infiltrative disease.  Failed milrinone wean x2.  CVP 5 this morning with co-ox 60%.  - He will need to go home on milrinone 0.25.  Will plan slow wean over time.  He is not on beta blocker due to low output.  - Hold torsemide today, will send home on torsemide 40 daily to start tomorrow.  - Concerned he may need advanced therapies. He would be LVAD candidate and weight is now down in range where transplant would be feasible. RA/PCWP on cath < 0.50.    - Continue digoxin 0.25, level has been ok.   - Continue current Entresto and spironolactone.   - Tolerating ivabradine 7.5 bid. Remains mildly tachycardic.  3. Acute respiratory failure: Suspect pulmonary edema plays a role but cannot rule out autoimmune pneumonitis process => serologies all negative so far. He is now off oxygen.  4. Rash: Resolving.  5. Ischemic toes: Suspect due to low flow/low cardiac output => think unlikely to be cardioembolic with symmetry. No LV thrombus noted on echo. He has dopplerable and now palpable lower extremity pulses.  Vascular has evaluated, will need followup with vascular as outpatient in 2 wks.   6. Elevated LFTs: Prominent elevated bilirubin. Suspect this is related to congestive hepatopathy. LFTs now down with hemodynamic support.   7. ID: Had course of vancomycin/Zosyn initially during stay, ? PNA component.  PCT was very elevated.  Cultures negative. Off antibiotics now. + Coxsackie A Antibody, suspect this is may be cause of viral myocarditis.  He redeveloped fever, possible PNA on CXR. Cultures from 3/3 --> UA negative, blood cultures - NGTD.  - Now day 4 of Cefepime and Vancomycin. Home on doxycycline 100 mg bid to complete 7 days.  8. Rheum: ?Autoimmune process like vasculitis. Elevated CRP, normal ESR. Autoimmune serologies all negative so far.Now off steroids.  9. AKI: Evaluated by renal, do not think there is autoimmune process involving kidneys, probably cardiorenal situation.  BUN/creatinine now normal with hemodynamic support. 10. Anxiety/depression: Celexa and prn Xanax started 2/25 11. Blood Type A+ 12. RUE DVT: Confirmed on Ultrasound 2/27. Was on  heparin gtt but PTT had been low. Now on bivalirudin.   Hematology input appreciated. Platelets stable, unlikely HIT. 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  Coumadin started, INR still subtherapeutic. Off bivalirudin and on Lovenox. 13. NSVT:  [facility] arranged on discharge. Will need with NSVT and using milrinone.  14. Disposition: I think that he can go home today.  He will need followup with [facility] coumadin clinic as he will be on Lovenox/coumadin overlap.  He will need followup with me in 1 week.  He will need home health/PT.  He will need [facility].  He will need followup with Dr Trula Slade ([facility]) in 2 wks. Home meds: Milrinone 0.25, digoxin 0.25 daily, Entresto 24/26 bid, spironolactone 25 daily, Zantac for GI protection, Lovenox bid per pharmacy, coumadin per pharmacy, ivabradine 7.5 bid, citalopram 10 daily, magnesium oxide 400 bid, torsemide 40 daily (start tomorrow), KCl 40 daily, tessalon perles for cough, doxycycline 100 mg bid to complete 7 days abx (4 more days).    Loralie Champagne   10/01/2015

## 2015-10-01 NOTE — Discharge Instructions (Signed)
Information on my medicine - Coumadin   (Warfarin)  This medication education was reviewed with me or my healthcare representative as part of my discharge preparation.  The pharmacist that spoke with me during my hospital stay was:  Severiano Gilbert, [facility]  Why was Coumadin prescribed for you? Coumadin was prescribed for you because you have a blood clot or a medical condition that can cause an increased risk of forming blood clots. Blood clots can cause serious health problems by blocking the flow of blood to the heart, lung, or brain. Coumadin can prevent harmful blood clots from forming. As a reminder your indication for Coumadin is:   Deep Vein Thrombosis Treatment  What test will check on my response to Coumadin? While on Coumadin (warfarin) you will need to have an INR test regularly to ensure that your dose is keeping you in the desired range. The INR (international normalized ratio) number is calculated from the result of the laboratory test called prothrombin time (PT).  If an INR APPOINTMENT HAS NOT ALREADY BEEN MADE FOR YOU please schedule an appointment to have this lab work done by your health care provider within 7 days. Your INR goal is a number between:  2 to 3.  What  do you need to  know  About  COUMADIN? Take Coumadin (warfarin) exactly as prescribed by your healthcare provider about the same time each day.  DO NOT stop taking without talking to the doctor who prescribed the medication.  Stopping without other blood clot prevention medication to take the place of Coumadin may increase your risk of developing a new clot or stroke.  Get refills before you run out.  What do you do if you miss a dose? If you miss a dose, take it as soon as you remember on the same day then continue your regularly scheduled regimen the next day.  Do not take two doses of Coumadin at the same time.  Important Safety Information A possible side effect of Coumadin (Warfarin) is an increased risk of  bleeding. You should call your healthcare provider right away if you experience any of the following: ? Bleeding from an injury or your nose that does not stop. ? Unusual colored urine (red or dark brown) or unusual colored stools (red or black). ? Unusual bruising for unknown reasons. ? A serious fall or if you hit your head (even if there is no bleeding).  Some foods or medicines interact with Coumadin (warfarin) and might alter your response to warfarin. To help avoid this: ? Eat a balanced diet, maintaining a consistent amount of Vitamin K. ? Notify your provider about major diet changes you plan to make. ? Avoid alcohol or limit your intake to 1 drink for women and 2 drinks for men per day. (1 drink is 5 oz. wine, 12 oz. beer, or 1.5 oz. liquor.)  Make sure that ANY health care provider who prescribes medication for you knows that you are taking Coumadin (warfarin).  Also make sure the healthcare provider who is monitoring your Coumadin knows when you have started a new medication including herbals and non-prescription products.  Coumadin (Warfarin)  Major Drug Interactions  Increased Warfarin Effect Decreased Warfarin Effect  Alcohol (large quantities) Antibiotics (esp. Septra/Bactrim, Flagyl, Cipro) Amiodarone (Cordarone) Aspirin (ASA) Cimetidine (Tagamet) Megestrol (Megace) NSAIDs (ibuprofen, naproxen, etc.) Piroxicam (Feldene) Propafenone (Rythmol SR) Propranolol (Inderal) Isoniazid (INH) Posaconazole (Noxafil) Barbiturates (Phenobarbital) Carbamazepine (Tegretol) Chlordiazepoxide (Librium) Cholestyramine (Questran) Griseofulvin Oral Contraceptives Rifampin Sucralfate (Carafate) Vitamin K  Coumadin (Warfarin) Major Herbal Interactions  Increased Warfarin Effect Decreased Warfarin Effect  Garlic Ginseng Ginkgo biloba Coenzyme Q10 Green tea St. Johns wort    Coumadin (Warfarin) FOOD Interactions  Eat a consistent number of servings per week of foods HIGH in  Vitamin K (1 serving =  cup)  Collards (cooked, or boiled & drained) Kale (cooked, or boiled & drained) Mustard greens (cooked, or boiled & drained) Parsley *serving size only =  cup Spinach (cooked, or boiled & drained) Swiss chard (cooked, or boiled & drained) Turnip greens (cooked, or boiled & drained)  Eat a consistent number of servings per week of foods MEDIUM-HIGH in Vitamin K (1 serving = 1 cup)  Asparagus (cooked, or boiled & drained) Broccoli (cooked, boiled & drained, or raw & chopped) Brussel sprouts (cooked, or boiled & drained) *serving size only =  cup Lettuce, raw (green leaf, endive, romaine) Spinach, raw Turnip greens, raw & chopped   These websites have more information on Coumadin (warfarin):  FailFactory.se; VeganReport.com.au;

## 2015-10-01 NOTE — Progress Notes (Signed)
Have alerted pam w [facility] so home milrinone can be hooked up prior to disch. [facility] was fitted last evening.

## 2015-10-01 NOTE — Progress Notes (Signed)
ANTICOAGULATION CONSULT NOTE - Follow Up Consult  Pharmacy Consult for Lovenox and warfarin Indication: DVT  No Known Allergies  Patient Measurements: Height: 6\' 2"  (188 cm) Weight: 262 lb 9.6 oz (119.115 kg) IBW/kg (Calculated) : 82.2  Vital Signs: Temp: 98.9 F (37.2 C) (03/09 0728) Temp Source: Oral (03/09 0728) BP: 108/41 mmHg (03/09 0422) Pulse Rate: 102 (03/09 0422)  Labs:  Recent Labs  09/29/15 0530 09/30/15 0759 10/01/15 0500  HGB 9.7* 10.7* 9.8*  HCT 31.7* 33.2* 30.6*  PLT 325 336 339  APTT 74*  --   --   LABPROT 20.5* 16.2* 20.4*  INR 1.76* 1.29 1.75*  CREATININE 0.71 0.70 0.74    Estimated Creatinine Clearance: 202.1 mL/min (by C-G formula based on Cr of 0.74).   Assessment: 20 yo m with no PMH admitted on 2/20 with bilateral LE swelling x 1 week and SOB x 2.5 months.Found to have an EF of 15% and newly diagnosed cardiomyopathy. Initial LE doppler negative for DVT, no VQ or CT scan and ECHO neg for clot. Now upper extremity doppler is positive for acute thrombus in right Internal jugular vein and subclavian vein. Right cephalic vein superificial vein thrombus. Pharmacy is consulted to dose Lovenox and warfarin.  INR 1.75  CBC stable, no bleeding   Goal of Therapy:  Anti-Xa level 0.6-1 INR 2-3 Monitor platelets by anticoagulation protocol: Yes   Plan:  1) Lovenox 120 mg every 12 hours 2) Warfarin 12.5 mg x1 tonight  3) Daily CBC, aPTT, INR   [name], PharmD Clinical Pharmacy Resident Pager: [phone] 10/01/2015 8:24 AM

## 2015-10-02 ENCOUNTER — Telehealth: Payer: Self-pay | Admitting: Surgery

## 2015-10-02 MED FILL — Alteplase For Inj 2 MG: INTRAMUSCULAR | Qty: 2 | Status: AC

## 2015-10-02 NOTE — Telephone Encounter (Signed)
LM for pt re appt, dpm °

## 2015-10-02 NOTE — Telephone Encounter (Signed)
-----   Message from Sharee Pimple, RN sent at 09/30/2015  9:24 AM EST ----- Regarding: schedule   ----- Message -----    From: Dara Lords, PA-C    Sent: 09/30/2015   9:18 AM      To: [facility] Charge Pool  Dr. Myra Gianotti wants to see this pt back in 2 weeks.  No studies needed.  Thanks, Lelon Mast

## 2015-10-05 ENCOUNTER — Telehealth (HOSPITAL_COMMUNITY): Payer: Self-pay | Admitting: *Deleted

## 2015-10-05 ENCOUNTER — Ambulatory Visit (INDEPENDENT_AMBULATORY_CARE_PROVIDER_SITE_OTHER): Payer: BLUE CROSS/BLUE SHIELD | Admitting: *Deleted

## 2015-10-05 DIAGNOSIS — I82621 Acute embolism and thrombosis of deep veins of right upper extremity: Secondary | ICD-10-CM

## 2015-10-05 DIAGNOSIS — Z7901 Long term (current) use of anticoagulants: Secondary | ICD-10-CM | POA: Diagnosis not present

## 2015-10-05 LAB — POCT INR: INR: 4

## 2015-10-05 NOTE — Telephone Encounter (Signed)
Patty aware and agreeable

## 2015-10-05 NOTE — Telephone Encounter (Signed)
His HR was running high in hospital.  If he is feeling good, would probably not do anything differently for now.  Keep on meds.  Let us know if BP is low or he is feeling worse.  He has followup this week.

## 2015-10-05 NOTE — Patient Instructions (Signed)

## 2015-10-05 NOTE — Telephone Encounter (Signed)
Patty, RN w/[facility] called to let us know pt's HR continues to remain elevated, she states Fri it was 130s and yesterday it was 126, BP 130/80 wt 257 lb which was down 2 lb from d/c.  She states pt is not a complainer, denies SOB, has good appetite.  She states he has all medications and has been taking them as directed including Corlanor 7.5 mg Twice daily.  She is sch to see pt again Tue and [place]is Cao this week, with labs on [place] and pt is sch to see [place] on Fri 3/17.  Will send to Dr Shirlee Latch to review and let her know if changes need to be made.

## 2015-10-08 ENCOUNTER — Telehealth (HOSPITAL_COMMUNITY): Payer: Self-pay | Admitting: Pharmacist

## 2015-10-08 ENCOUNTER — Ambulatory Visit (INDEPENDENT_AMBULATORY_CARE_PROVIDER_SITE_OTHER): Payer: BLUE CROSS/BLUE SHIELD | Admitting: Pharmacist

## 2015-10-08 ENCOUNTER — Telehealth: Payer: Self-pay

## 2015-10-08 DIAGNOSIS — Z7901 Long term (current) use of anticoagulants: Secondary | ICD-10-CM

## 2015-10-08 DIAGNOSIS — I82621 Acute embolism and thrombosis of deep veins of right upper extremity: Secondary | ICD-10-CM

## 2015-10-08 LAB — POCT INR: INR: 5.4

## 2015-10-08 NOTE — Telephone Encounter (Signed)
Entresto 24-26 mg PA approved by [employer].   [name]. Bonnye Fava, PharmD, BCPS, CPP Clinical Pharmacist Pager: [phone] Phone: [phone] 10/08/2015 3:20 PM

## 2015-10-08 NOTE — Telephone Encounter (Signed)
Corlanor 7.5 mg PA approved by [employer].   [name]. Bonnye Fava, PharmD, BCPS, CPP Clinical Pharmacist Pager: [phone] Phone: [phone] 10/08/2015 3:18 PM

## 2015-10-08 NOTE — Telephone Encounter (Signed)
Spoke with pts father to r.s, dpm

## 2015-10-08 NOTE — Telephone Encounter (Addendum)
HH RN called re: left great toe medial ulcer, and posterior portion of 2nd and 3rd toes are open and draining a clear fluid; denied any edema.  Reported the base of 2nd and 3rd toes are showing beefy red granulation tissue.  Questioned any new treatment for toes?  Reported that the pt. Has a PICC lin in left arm, and DVT right arm.  Questions if she can draw his lab work peripherally from the right arm.  Advised that the pt. Is noted to have DVT of right IJ, Subclavian, Axillary, and Cephalic veins.  Deferred the question of peripheral blood draw to the MD that is medically managing pt's right arm DVT.  F/u appt. With Dr. Myra Gianotti changed from 3/24 to 3/20.  Advised that pt. Should keep the toes clean/ dry, until evaluated in office on 3/20.  Nurse will relay instructions/ appt. to patient.

## 2015-10-09 ENCOUNTER — Encounter: Payer: Self-pay | Admitting: Surgery

## 2015-10-09 ENCOUNTER — Ambulatory Visit (HOSPITAL_COMMUNITY)
Admit: 2015-10-09 | Discharge: 2015-10-09 | Disposition: A | Discharge status: Home / Self Care | Payer: BLUE CROSS/BLUE SHIELD | Source: Ambulatory Visit | Attending: Cardiology | Admitting: Cardiology

## 2015-10-09 VITALS — BP 130/70 | HR 129 | Wt 252.0 lb

## 2015-10-09 DIAGNOSIS — I472 Ventricular tachycardia: Secondary | ICD-10-CM | POA: Diagnosis not present

## 2015-10-09 DIAGNOSIS — I428 Other cardiomyopathies: Secondary | ICD-10-CM | POA: Diagnosis not present

## 2015-10-09 DIAGNOSIS — I5022 Chronic systolic (congestive) heart failure: Secondary | ICD-10-CM | POA: Insufficient documentation

## 2015-10-09 DIAGNOSIS — I5021 Acute systolic (congestive) heart failure: Secondary | ICD-10-CM

## 2015-10-09 DIAGNOSIS — Z79899 Other long term (current) drug therapy: Secondary | ICD-10-CM | POA: Insufficient documentation

## 2015-10-09 DIAGNOSIS — I998 Other disorder of circulatory system: Secondary | ICD-10-CM | POA: Diagnosis not present

## 2015-10-09 DIAGNOSIS — Z7901 Long term (current) use of anticoagulants: Secondary | ICD-10-CM | POA: Insufficient documentation

## 2015-10-09 DIAGNOSIS — I82401 Acute embolism and thrombosis of unspecified deep veins of right lower extremity: Secondary | ICD-10-CM | POA: Diagnosis not present

## 2015-10-09 DIAGNOSIS — Z86718 Personal history of other venous thrombosis and embolism: Secondary | ICD-10-CM | POA: Diagnosis not present

## 2015-10-09 LAB — CARBOXYHEMOGLOBIN
CARBOXYHEMOGLOBIN: 1.2 % (ref 0.5–1.5)
METHEMOGLOBIN: 0.7 % (ref 0.0–1.5)
O2 Saturation: 64.1 %
Total hemoglobin: 10.6 g/dL — ABNORMAL LOW (ref 13.5–18.0)

## 2015-10-09 MED ORDER — MILRINONE IN DEXTROSE 20 MG/100ML IV SOLN: 0.1250 ug/kg/min | INTRAVENOUS | Status: DC

## 2015-10-09 MED ORDER — SACUBITRIL-VALSARTAN 49-51 MG PO TABS: 1.0000 | ORAL_TABLET | Freq: Two times a day (BID) | ORAL | Status: DC

## 2015-10-09 MED ORDER — TORSEMIDE 20 MG PO TABS: 20.0000 mg | ORAL_TABLET | Freq: Every day | ORAL | Status: DC

## 2015-10-09 NOTE — Progress Notes (Signed)
Patient ID: James Bennett, male   DOB: 02-04-1996, 20 y.o.   MRN: [id number]  Primary Cardiologist: Dr Shirlee Latch  Vascular: Dr Myra Gianotti  HPI: James Bennett is a 20 year old with a no medical history until hospital admit February 20th, 2017  for acute dyspnea . He was admitted with acute respiratory failure complicated by cardiogenic shock, ischemic toes and RUE DVT. Due to low output he had ischemic toes. Vascular evaluated. He did not require surgical interventio. EF 15% by echo with mildly dilated and mildly dysfunctional RV. Low output HF with marked volume overload initially. Was on milrinone but able to titrate off initially. RHC on 2/23 showed good cardiac output off milrinone but still very volume overloaded. Milrinone restarted 2/25 for recurrent profound shock. Concern for viral myocarditis, has had viral-type symptoms for several weeks and has had antibiotic courses at home prior to admission. Coxsackie Antibody A + so suspect viral myocarditis (IgG rather than IgM, so not totally sure how recent exposure was). cMRI with EF 29%, poor contrast study so not able to comment on infiltrative disease. Failed milrinone wean x 2, went home on IV milrinone.  Today he returns for post hospital follow up. Ongoing cough. Overall feeling ok. Denies SOB/PND/Orthopnea. Weight at home trending down from 259 to 252 pounds. Taking all medications. No BRBPR/melena on warfarin.  Has [facility] for HH milrinone. Wearing Lifevest.  ECG: Sinus tachycardia at 129  Labs (3/17): HCT 30.6, K 3.8, creatinine 0.74  PMH: 1. Nonischemic cardiomyopathy: 2/17 admitted with cardiogenic shock.  Suspected acute myocarditis, coxsackievirus A positive.  He was started on milrinone, unable to titrate off.  - Echo (2/17) with EF 15%, diffuse hypokinesis, mildly decreased RV systolic function.  - Cardiac MRI (3/17) with EF 29%, moderately dilated LV, mildly decreased RV systolic function, unable to assess for late gadolinium enhancement (poor  quality contrast). - RHC (2/17) with mean RA 19, PA 51/32 mean 45, PCWP mean 40, CI 2.24 Fick/2.56 thermo.  - Blood type A+. 2. Ischemic toes: Suspect due to low output/cardiogenic shock. 3. RUE DVT: Now on coumadin.  4. NSVT: Lifevest  ROS: All systems negative except as listed in HPI, PMH and Problem List.  SH:  Social History   Social History  . Marital Status: Single    Spouse Name: N/A  . Number of Children: N/A  . Years of Education: N/A   Occupational History  . Not on file.   Social History Main Topics  . Smoking status: Never Smoker   . Smokeless tobacco: Not on file  . Alcohol Use: No  . Drug Use: No  . Sexual Activity: Not on file   Other Topics Concern  . 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 Bird exposure through his father's workplace of a small parrot. No mold exposure. No recent travel.    FH:  Family History  Problem Relation Age of Onset  . Lupus Mother     Current Outpatient Prescriptions  Medication Sig Dispense Refill  . acetaminophen (TYLENOL) 325 MG tablet Take 2 tablets (650 mg total) by mouth every 4 (four) hours as needed for headache or mild pain.    . benzonatate (TESSALON) 100 MG capsule Take 1 capsule (100 mg total) by mouth 3 (three) times daily as needed for cough. 45 capsule 0  . citalopram (CELEXA) 10 MG tablet Take 1 tablet (10 mg total) by mouth daily. 30  tablet 3  . digoxin (LANOXIN) 0.25 MG tablet Take 1 tablet (0.25 mg total) by mouth daily. 30 tablet 6  . famotidine (PEPCID) 20 MG tablet Take 1 tablet (20 mg total) by mouth 2 (two) times daily. 60 tablet 6  . hydrocortisone cream 1 % Apply topically every 2 (two) hours as needed for itching. 30 g 0  . ivabradine (CORLANOR) 7.5 MG TABS tablet Take 1 tablet (7.5 mg total) by mouth 2 (two) times daily with a meal. 60 tablet 6  . magnesium oxide (MAG-OX) 400 (241.3 Mg) MG tablet Take 1 tablet  (400 mg total) by mouth 2 (two) times daily. 60 tablet 6  . milrinone (PRIMACOR) 20 MG/100ML SOLN infusion Inject 32.175 mcg/min into the vein continuous. 100 mL 0  . potassium chloride SA (K-DUR,KLOR-CON) 20 MEQ tablet Take 2 tablets (40 mEq total) by mouth daily. 60 tablet 6  . sacubitril-valsartan (ENTRESTO) 24-26 MG Take 1 tablet by mouth 2 (two) times daily. 60 tablet 6  . spironolactone (ALDACTONE) 25 MG tablet Take 1 tablet (25 mg total) by mouth daily. 30 tablet 6  . torsemide (DEMADEX) 20 MG tablet Take 20 mg by mouth 2 (two) times daily.    [name] warfarin (COUMADIN) 10 MG tablet Take 1 tablet (10 mg total) by mouth daily. 30 tablet 6  . warfarin (COUMADIN) 5 MG tablet Take 0.5 tablets (2.5 mg total) by mouth one time only at 6 PM. 30 tablet 6   No current facility-administered medications for this encounter.    Filed Vitals:   10/09/15 1125  BP: 130/70  Pulse: 129  Weight: 252 lb (114.306 kg)  SpO2: 98%    PHYSICAL EXAM: General:  Well appearing. No resp difficulty. Wearing Lifevest  HEENT: normal Neck: supple. JVP 6-7  flat. Carotids 2+ bilaterally; no bruits. No lymphadenopathy or thryomegaly appreciated. Cor: PMI normal. Regular rate & rhythm. No rubs, gallops or murmurs. No S3  Lungs: clear Abdomen: soft, nontender, nondistended. No hepatosplenomegaly. No bruits or masses. Good bowel sounds. Extremities: no cyanosis, clubbing, rash, edema RUE PICC . Wearing off loading shoes.  Neuro: alert & orientedx3, cranial nerves grossly intact. Moves all 4 extremities w/o difficulty. Affect pleasant.  ASSESSMENT & PLAN: 1. Chronic systolic CHF:  2/17 admit with cardiogenic shock.  EF 15% by echo with mildly dilated and mildly dysfunctional RV. Was on milrinone but able to titrate off initially. RHC on 2/23 showed good cardiac output off milrinone but still very volume overloaded. Milrinone restarted 2/25 for recurrent profound shock. Concern for viral myocarditis, had had viral-type  symptoms for several weeks and had had antibiotic courses at home prior to admission. Coxsackie Antibody A + so suspect viral myocarditis (IgG rather than IgM, so not totally sure how recent exposure was). cMRI with EF 29%, poor contrast study so not able to comment on infiltrative disease. Failed milrinone wean x 2 in hospital. NYHA II. Volume status stable.  - Check CO-OX today =>64%.  Cut back milrinone to 0.125 mcg/kg/min - Cut back torsemide to 20 mg daily. - Continue digoxin 0.25, check level.   - Continue current spironolactone.  - Increase entresto to 49-51 mg twice a day, BMET in 1 week.  - Tolerating ivabradine 7.5 bid. Remains mildly tachycardic hopefully will improve with milrinone wean.  - Plan to repeat ECHO in 3 months after HF meds optimized. Will need Lifevest until repeat ECHO.   2. Ischemic toes: Suspect due to low flow/low cardiac output => think unlikely to be  cardioembolic with symmetry. No LV thrombus noted on echo.  He has follow up with Dr Dahlia Byes next week.  3. RUE DVT: Confirmed on Ultrasound 2/27. On coumadin, should continue at least 6 months.  Check CBC.  INR being drawn by [facility].  4. NSVT: Continue Lifevest arranged on discharge. Continue until ECHO repeated  Follow up in 1 week. Check CO-OX next appointment. Hopefully can stop milrinone.   Amy Clegg NP-C  2:04 PM   Patient seen with NP, agree with the above note.  He is stable, NYHA class II.  Not volume overloaded on exam.  Still with tachycardia.  Good co-ox today.  - Decrease milrinone to 0.125. Check co-ox next appt, if stable can stop milrinone.  - Increase Entresto to 49/51 bid. - Repeat echo in 3 months, will need to continue Lifevest until then.  - Check CBC, BMET, digoxin level today.  Repeat BMET 1 week.  - Continue coumadin at least 6 months.  - Ok to decrease torsemide to 20 mg daily.   Marca Ancona 10/10/2015

## 2015-10-09 NOTE — Progress Notes (Signed)
Advanced Heart Failure Medication Review by a Pharmacist  Does the patient  feel that his/her medications are working for him/her?  yes  Has the patient been experiencing any side effects to the medications prescribed?  no  Does the patient measure his/her own blood pressure or blood glucose at home?  yes   Does the patient have any problems obtaining medications due to transportation or finances?   no  Understanding of regimen: good Understanding of indications: good Potential of compliance: good Patient understands to avoid NSAIDs. Patient understands to avoid decongestants.  Issues to address at subsequent visits: None   Pharmacist comments: 20 YO pleasant male presents to clinic with his parents and a medication list.  Parents are helping pt with his medications and state that pt is compliant with his current meds. Pt and family deny s/sx of bleeding on warfarin but did have questions about what an INR value meant which I did spend time explaining to them. Pt denies SE to his medications or problems obtaining medications.   Time with patient: 10 min  Preparation and documentation time: 5 min  Total time: 15 min

## 2015-10-09 NOTE — Patient Instructions (Signed)
Will arrange your [facility] nurse to decrease milrinone drip in the home, as well as take lab work.  INCREASE Entresto to 49/51 mg tablet twice daily.  DECREASE Torsemide to 20 mg ONCE daily.  Follow up 1 week with Dr. Shirlee Latch.  Do the following things EVERYDAY: 1) Weigh yourself in the morning before breakfast. Write it down and keep it in a log. 2) Take your medicines as prescribed 3) Eat low salt foods-Limit salt (sodium) to 2000 mg per day.  4) Stay as active as you can everyday 5) Limit all fluids for the day to less than 2 liters

## 2015-10-12 ENCOUNTER — Encounter: Payer: Self-pay | Admitting: Surgery

## 2015-10-12 ENCOUNTER — Ambulatory Visit (INDEPENDENT_AMBULATORY_CARE_PROVIDER_SITE_OTHER): Payer: BLUE CROSS/BLUE SHIELD | Admitting: Surgery

## 2015-10-12 VITALS — BP 126/73 | HR 118 | Ht 74.0 in | Wt 255.0 lb

## 2015-10-12 DIAGNOSIS — B3322 Viral myocarditis: Secondary | ICD-10-CM | POA: Diagnosis not present

## 2015-10-12 NOTE — Progress Notes (Signed)
Patient name: James Bennett MRN: [id number] DOB: 1996/06/26 Sex: male     Chief Complaint  Patient presents with  . Re-evaluation    eval bilateral LE ulceration    HISTORY OF PRESENT ILLNESS: The patient is here for follow-up.  He presented to the hospital with viral myocarditis.  During his complicated course, he developed hypoperfusion to all 10 toes which became ischemic.  The patient's clinical condition improved were he can be discharged from the hospital.  I am following his toes for possible amputation.  He denies any pain in his feet.  Past Medical History  Diagnosis Date  . Bronchitis   . Deep vein thrombosis (DVT) of upper extremity (HCC) 09/22/2015    right  . Thrombocytopenia (HCC) 09/22/2015    Mild, transient    Past Surgical History  Procedure Laterality Date  . Cardiac catheterization N/A 09/17/2015    Procedure: Right Heart Cath;  Surgeon: Laurey Morale, MD;  Location: [facility] INVASIVE CV LAB;  Service: Cardiovascular;  Laterality: N/A;    Social History   Social History  . Marital Status: Single    Spouse Name: N/A  . Number of Children: N/A  . Years of Education: N/A   Occupational History  . Not on file.   Social History Main Topics  . Smoking status: Never Smoker   . Smokeless tobacco: Not on file  . Alcohol Use: No  . Drug Use: No  . Sexual Activity: Not on file   Other Topics Concern  . 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 Bird exposure through his father's workplace of a small parrot. No mold exposure. No recent travel.    Family History  Problem Relation Age of Onset  . Lupus Mother     Allergies as of 10/12/2015  . (No Known Allergies)    Current Outpatient Prescriptions on File Prior to Visit  Medication Sig Dispense Refill  . acetaminophen (TYLENOL) 325 MG tablet Take 2 tablets (650 mg total) by mouth every 4 (four) hours as needed for  headache or mild pain.    . benzonatate (TESSALON) 100 MG capsule Take 1 capsule (100 mg total) by mouth 3 (three) times daily as needed for cough. 45 capsule 0  . citalopram (CELEXA) 10 MG tablet Take 1 tablet (10 mg total) by mouth daily. 30 tablet 3  . digoxin (LANOXIN) 0.25 MG tablet Take 1 tablet (0.25 mg total) by mouth daily. 30 tablet 6  . famotidine (PEPCID) 20 MG tablet Take 1 tablet (20 mg total) by mouth 2 (two) times daily. 60 tablet 6  . hydrocortisone cream 1 % Apply topically every 2 (two) hours as needed for itching. 30 g 0  . ivabradine (CORLANOR) 7.5 MG TABS tablet Take 1 tablet (7.5 mg total) by mouth 2 (two) times daily with a meal. 60 tablet 6  . magnesium oxide (MAG-OX) 400 (241.3 Mg) MG tablet Take 1 tablet (400 mg total) by mouth 2 (two) times daily. 60 tablet 6  . milrinone (PRIMACOR) 20 MG/100ML SOLN infusion Inject 14.2875 mcg/min into the vein continuous. 100 mL 0  . potassium chloride SA (K-DUR,KLOR-CON) 20 MEQ tablet Take 2 tablets (40 mEq total) by mouth daily. 60 tablet 6  . sacubitril-valsartan (ENTRESTO) 49-51 MG Take 1 tablet by mouth 2 (two) times daily. 60 tablet 3  . spironolactone (ALDACTONE) 25 MG tablet Take 1 tablet (25  mg total) by mouth daily. 30 tablet 6  . torsemide (DEMADEX) 20 MG tablet Take 1 tablet (20 mg total) by mouth daily. 30 tablet 6  . warfarin (COUMADIN) 10 MG tablet Take 1 tablet (10 mg total) by mouth daily. 30 tablet 6  . warfarin (COUMADIN) 5 MG tablet Take 0.5 tablets (2.5 mg total) by mouth one time only at 6 PM. (Patient not taking: Reported on 10/12/2015) 30 tablet 6   No current facility-administered medications on file prior to visit.     REVIEW OF SYSTEMS: Cardiovascular: Viral myocarditis Pulmonary: Positive cough. Neurologic: No weakness, paresthesias, aphasia, or amaurosis. No dizziness. Hematologic: No bleeding problems or clotting disorders. Musculoskeletal: Dark-colored toes. Gastrointestinal: No blood in stool or  hematemesis Genitourinary: No dysuria or hematuria. Psychiatric:: No history of major depression. Integumentary: No rashes or ulcers. Constitutional: No fever or chills.  Weight loss  PHYSICAL EXAMINATION:   Vital signs are  Filed Vitals:   10/12/15 1154  BP: 126/73  Pulse: 118  Height:  (1.88 m)  Weight: 255 lb (115.667 kg)  SpO2: 94%   Body mass index is 32.73 kg/(m^2). General: The patient appears their stated age. HEENT:  No gross abnormalities Pulmonary:  Non labored breathing Neurologic: No focal weakness or paresthesias are detected, Skin: Ischemic/black discoloration to all 5 toes. Psychiatric: The patient has normal affect. Cardiovascular: Palpable pedal pulses bilaterally  I debrided some of the dead skin from his toes and was pleasantly surprised that there was healthy-appearing skin underneath several of the toes particularly the fifth toes bilaterally.  Diagnostic Studies None  Assessment: Ischemic toes 10 Plan: I discussed with the patient and his parents that he is most likely going to require amputation of some of the digits, however I think that we should continue to monitor these as there has been some improvement and this may limit the amount of amputation.  We will continue with antibiotic ointment to the toes.  I have encouraged him to soak his toes daily and wash them with soap and water.  He will follow-up with me in 10 days.  He continues to be on Coumadin for DVT  V. Charlena Cross, M.D. [facility] Office: [phone] Pager:  [phone]

## 2015-10-13 ENCOUNTER — Ambulatory Visit (INDEPENDENT_AMBULATORY_CARE_PROVIDER_SITE_OTHER): Payer: BLUE CROSS/BLUE SHIELD | Admitting: Cardiology

## 2015-10-13 DIAGNOSIS — Z7901 Long term (current) use of anticoagulants: Secondary | ICD-10-CM

## 2015-10-13 DIAGNOSIS — I82621 Acute embolism and thrombosis of deep veins of right upper extremity: Secondary | ICD-10-CM

## 2015-10-13 LAB — POCT INR: INR: 2.7

## 2015-10-16 ENCOUNTER — Ambulatory Visit (INDEPENDENT_AMBULATORY_CARE_PROVIDER_SITE_OTHER): Payer: BLUE CROSS/BLUE SHIELD | Admitting: Pharmacist

## 2015-10-16 ENCOUNTER — Encounter (HOSPITAL_COMMUNITY): Payer: Self-pay

## 2015-10-16 ENCOUNTER — Encounter: Payer: Self-pay | Admitting: Surgery

## 2015-10-16 ENCOUNTER — Ambulatory Visit (HOSPITAL_COMMUNITY)
Admission: RE | Admit: 2015-10-16 | Discharge: 2015-10-16 | Disposition: A | Discharge status: Home / Self Care | Payer: BLUE CROSS/BLUE SHIELD | Source: Ambulatory Visit | Attending: Cardiology | Admitting: Cardiology

## 2015-10-16 ENCOUNTER — Encounter: Payer: BLUE CROSS/BLUE SHIELD | Admitting: Surgery

## 2015-10-16 VITALS — BP 90/60 | HR 123 | Resp 18 | Wt 254.0 lb

## 2015-10-16 DIAGNOSIS — I428 Other cardiomyopathies: Secondary | ICD-10-CM | POA: Diagnosis not present

## 2015-10-16 DIAGNOSIS — I82621 Acute embolism and thrombosis of deep veins of right upper extremity: Secondary | ICD-10-CM | POA: Diagnosis not present

## 2015-10-16 DIAGNOSIS — Z79899 Other long term (current) drug therapy: Secondary | ICD-10-CM | POA: Insufficient documentation

## 2015-10-16 DIAGNOSIS — Z7901 Long term (current) use of anticoagulants: Secondary | ICD-10-CM

## 2015-10-16 DIAGNOSIS — I5022 Chronic systolic (congestive) heart failure: Secondary | ICD-10-CM

## 2015-10-16 DIAGNOSIS — Z86718 Personal history of other venous thrombosis and embolism: Secondary | ICD-10-CM | POA: Diagnosis not present

## 2015-10-16 DIAGNOSIS — I472 Ventricular tachycardia: Secondary | ICD-10-CM | POA: Insufficient documentation

## 2015-10-16 DIAGNOSIS — I5021 Acute systolic (congestive) heart failure: Secondary | ICD-10-CM | POA: Diagnosis present

## 2015-10-16 DIAGNOSIS — I998 Other disorder of circulatory system: Secondary | ICD-10-CM | POA: Diagnosis not present

## 2015-10-16 LAB — POCT INR: INR: 2.6

## 2015-10-16 LAB — CARBOXYHEMOGLOBIN
Carboxyhemoglobin: 1.7 % — ABNORMAL HIGH (ref 0.5–1.5)
Methemoglobin: 0.6 % (ref 0.0–1.5)
O2 Saturation: 82.7 %
Total hemoglobin: 10.5 g/dL — ABNORMAL LOW (ref 13.5–18.0)

## 2015-10-16 NOTE — Patient Instructions (Signed)
Will call with lab results  Your physician recommends that you schedule a follow-up appointment in: 1 week

## 2015-10-17 ENCOUNTER — Telehealth: Payer: Self-pay | Admitting: Physician Assistant

## 2015-10-17 NOTE — Telephone Encounter (Signed)
     Patients father called about stopping home milrinone. I spoke with Dr. Shirlee Latch who said he could stop. I will let home health care know that the milrinone can be stopped.  Cline Crock PA-C  [facility]

## 2015-10-18 DIAGNOSIS — I5022 Chronic systolic (congestive) heart failure: Secondary | ICD-10-CM | POA: Insufficient documentation

## 2015-10-18 NOTE — Progress Notes (Signed)
Patient ID: James Bennett, male   DOB: 02-04-1996, 20 y.o.   MRN: [id number]  Primary Cardiologist: Dr Shirlee Latch  Vascular: Dr Myra Gianotti  HPI: James Bennett is a 20 year old with a no medical history until hospital admit February 20th, 2017  for acute dyspnea . He was admitted with acute respiratory failure complicated by cardiogenic shock, ischemic toes and RUE DVT. Due to low output he had ischemic toes. Vascular evaluated. He did not require surgical interventio. EF 15% by echo with mildly dilated and mildly dysfunctional RV. Low output HF with marked volume overload initially. Was on milrinone but able to titrate off initially. RHC on 2/23 showed good cardiac output off milrinone but still very volume overloaded. Milrinone restarted 2/25 for recurrent profound shock. Concern for viral myocarditis, has had viral-type symptoms for several weeks and has had antibiotic courses at home prior to admission. Coxsackie Antibody A + so suspect viral myocarditis (IgG rather than IgM, so not totally sure how recent exposure was). cMRI with EF 29%, poor contrast study so not able to comment on infiltrative disease. Failed milrinone wean x 2, went home on IV milrinone.  At last appointment, milrinone was turned down to 0.125 mcg/kg/min.   Today he returns for followup. Wearing Lifevest.  No exertional dyspnea.  HR still elevated.  No lightheadedness or syncope.  No palpitations.  No problems walking into the office today.  No chest pain.  Recently saw vascular surgery; toes slowly improving.   Labs (3/17): HCT 30.6 => 31, K 3.8, creatinine 0.74 => 0.66, co-ox 82%, digoxin 0.8  PMH: 1. Nonischemic cardiomyopathy: 2/17 admitted with cardiogenic shock.  Suspected acute myocarditis, coxsackievirus A positive.  He was started on milrinone, unable to titrate off.  - Echo (2/17) with EF 15%, diffuse hypokinesis, mildly decreased RV systolic function.  - Cardiac MRI (3/17) with EF 29%, moderately dilated LV, mildly decreased RV  systolic function, unable to assess for late gadolinium enhancement (poor quality contrast). - RHC (2/17) with mean RA 19, PA 51/32 mean 45, PCWP mean 40, CI 2.24 Fick/2.56 thermo.  - Blood type A+. 2. Ischemic toes: Suspect due to low output/cardiogenic shock. 3. RUE DVT: Now on coumadin.  4. NSVT: Lifevest  ROS: All systems negative except as listed in HPI, PMH and Problem List.  SH:  Social History   Social History  . Marital Status: Single    Spouse Name: N/A  . Number of Children: N/A  . Years of Education: N/A   Occupational History  . Not on file.   Social History Main Topics  . Smoking status: Never Smoker   . Smokeless tobacco: Not on file  . Alcohol Use: No  . Drug Use: No  . Sexual Activity: Not on file   Other Topics Concern  . 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 Bird exposure through his father's workplace of a small parrot. No mold exposure. No recent travel.    FH:  Family History  Problem Relation Age of Onset  . Lupus Mother     Current Outpatient Prescriptions  Medication Sig Dispense Refill  . acetaminophen (TYLENOL) 325 MG tablet Take 2 tablets (650 mg total) by mouth every 4 (four) hours as needed for headache or mild pain.    . benzonatate (TESSALON) 100 MG capsule Take 1 capsule (100 mg total) by mouth 3 (three) times daily as needed for cough. 45 capsule  0  . citalopram (CELEXA) 10 MG tablet Take 1 tablet (10 mg total) by mouth daily. 30 tablet 3  . digoxin (LANOXIN) 0.25 MG tablet Take 1 tablet (0.25 mg total) by mouth daily. 30 tablet 6  . famotidine (PEPCID) 20 MG tablet Take 1 tablet (20 mg total) by mouth 2 (two) times daily. 60 tablet 6  . hydrocortisone cream 1 % Apply topically every 2 (two) hours as needed for itching. 30 g 0  . ivabradine (CORLANOR) 7.5 MG TABS tablet Take 1 tablet (7.5 mg total) by mouth 2 (two) times daily with a meal. 60  tablet 6  . magnesium oxide (MAG-OX) 400 (241.3 Mg) MG tablet Take 1 tablet (400 mg total) by mouth 2 (two) times daily. 60 tablet 6  . milrinone (PRIMACOR) 20 MG/100ML SOLN infusion Inject 14.2875 mcg/min into the vein continuous. 100 mL 0  . potassium chloride SA (K-DUR,KLOR-CON) 20 MEQ tablet Take 2 tablets (40 mEq total) by mouth daily. 60 tablet 6  . sacubitril-valsartan (ENTRESTO) 49-51 MG Take 1 tablet by mouth 2 (two) times daily. 60 tablet 3  . spironolactone (ALDACTONE) 25 MG tablet Take 1 tablet (25 mg total) by mouth daily. 30 tablet 6  . torsemide (DEMADEX) 20 MG tablet Take 1 tablet (20 mg total) by mouth daily. 30 tablet 6  . warfarin (COUMADIN) 10 MG tablet Take 1 tablet (10 mg total) by mouth daily. 30 tablet 6  . warfarin (COUMADIN) 5 MG tablet Take 0.5 tablets (2.5 mg total) by mouth one time only at 6 PM. 30 tablet 6   No current facility-administered medications for this encounter.    Filed Vitals:   10/16/15 1142  BP: 90/60  Pulse: 123  Resp: 18  Weight: 254 lb (115.214 kg)  SpO2: 100%    PHYSICAL EXAM: General:  Well appearing. No resp difficulty. Wearing Lifevest  HEENT: normal Neck: supple. JVP 6-7  flat. Carotids 2+ bilaterally; no bruits. No lymphadenopathy or thryomegaly appreciated. Cor: PMI normal. Regular rate & rhythm. No rubs, gallops or murmurs. No S3  Lungs: clear Abdomen: soft, nontender, nondistended. No hepatosplenomegaly. No bruits or masses. Good bowel sounds. Extremities: no cyanosis, clubbing, rash, edema RUE PICC . Wearing off-loading shoes.  Neuro: alert & orientedx3, cranial nerves grossly intact. Moves all 4 extremities w/o difficulty. Affect pleasant.  ASSESSMENT & PLAN: 1. Chronic systolic CHF:  2/17 admit with cardiogenic shock.  EF 15% by echo with mildly dilated and mildly dysfunctional RV. Was on milrinone but able to titrate off initially. RHC on 2/23 showed good cardiac output off milrinone but still very volume overloaded.  Milrinone restarted 2/25 for recurrent profound shock. Concern for viral myocarditis, had had viral-type symptoms for several weeks and had had antibiotic courses at home prior to admission. Coxsackie Antibody A + so suspect viral myocarditis (IgG rather than IgM, so not totally sure how recent exposure was). cMRI with EF 29%, poor contrast study so not able to comment on infiltrative disease. Failed milrinone wean x 2 in hospital. NYHA II. Volume status stable.  - Check CO-OX today =>82%.  I will stop milrinone today.  Come back in 1 week and recheck co-ox.  Leave PICC in for now.  - Continue torsemide to 20 mg daily. - Continue digoxin 0.25, check level.   - Continue current spironolactone.  - Continue Entresto 49-51 mg twice a day, BMET in 1 week.  - Tolerating ivabradine 7.5 bid. Remains mildly tachycardic hopefully will improve with milrinone wean.  -  Plan to repeat ECHO in 3 months after HF meds optimized. Will need Lifevest until repeat ECHO.   2. Ischemic toes: Suspect due to low flow/low cardiac output => think unlikely to be cardioembolic with symmetry. No LV thrombus noted on echo.  Following with Dr Myra Gianotti.  3. RUE DVT: Confirmed on Ultrasound 2/27. On coumadin, should continue at least 6 months.  INR being drawn by [facility].  4. NSVT: Continue Lifevest until ECHO repeated  Marca Ancona 10/18/2015

## 2015-10-19 ENCOUNTER — Telehealth (HOSPITAL_COMMUNITY): Payer: Self-pay

## 2015-10-19 NOTE — Telephone Encounter (Signed)
Nurse with [facility] called to report milrinone drip was DC'd over the weekend on Saturday. States patient is feeling great, BP 148/76, HR 108, wt 248.6 lbs, no edema, SOB, or dizziness noted. VO given per Dr. Gala Romney to see patient once this week and next week for PICC line maintenance (dsg changes, flushes).  Ave Filter

## 2015-10-21 ENCOUNTER — Ambulatory Visit (INDEPENDENT_AMBULATORY_CARE_PROVIDER_SITE_OTHER): Payer: BLUE CROSS/BLUE SHIELD | Admitting: Cardiology

## 2015-10-21 DIAGNOSIS — Z7901 Long term (current) use of anticoagulants: Secondary | ICD-10-CM

## 2015-10-21 DIAGNOSIS — I82621 Acute embolism and thrombosis of deep veins of right upper extremity: Secondary | ICD-10-CM

## 2015-10-21 LAB — POCT INR: INR: 3.7

## 2015-10-22 ENCOUNTER — Encounter (HOSPITAL_COMMUNITY): Payer: Self-pay

## 2015-10-23 ENCOUNTER — Telehealth (HOSPITAL_COMMUNITY): Payer: Self-pay | Admitting: Vascular Surgery

## 2015-10-23 ENCOUNTER — Ambulatory Visit (HOSPITAL_COMMUNITY)
Admission: RE | Admit: 2015-10-23 | Discharge: 2015-10-23 | Disposition: A | Discharge status: Home / Self Care | Payer: BLUE CROSS/BLUE SHIELD | Source: Ambulatory Visit | Attending: Cardiology | Admitting: Cardiology

## 2015-10-23 ENCOUNTER — Encounter (HOSPITAL_COMMUNITY): Payer: Self-pay

## 2015-10-23 VITALS — BP 124/54 | HR 112 | Ht 74.0 in | Wt 253.1 lb

## 2015-10-23 DIAGNOSIS — I5022 Chronic systolic (congestive) heart failure: Secondary | ICD-10-CM | POA: Diagnosis not present

## 2015-10-23 DIAGNOSIS — I472 Ventricular tachycardia: Secondary | ICD-10-CM | POA: Insufficient documentation

## 2015-10-23 DIAGNOSIS — Z7901 Long term (current) use of anticoagulants: Secondary | ICD-10-CM | POA: Diagnosis not present

## 2015-10-23 DIAGNOSIS — I428 Other cardiomyopathies: Secondary | ICD-10-CM | POA: Insufficient documentation

## 2015-10-23 DIAGNOSIS — Z86718 Personal history of other venous thrombosis and embolism: Secondary | ICD-10-CM | POA: Insufficient documentation

## 2015-10-23 DIAGNOSIS — I998 Other disorder of circulatory system: Secondary | ICD-10-CM | POA: Insufficient documentation

## 2015-10-23 DIAGNOSIS — Z79899 Other long term (current) drug therapy: Secondary | ICD-10-CM | POA: Insufficient documentation

## 2015-10-23 LAB — CARBOXYHEMOGLOBIN
CARBOXYHEMOGLOBIN: 1.9 % — AB (ref 0.5–1.5)
METHEMOGLOBIN: 0.7 % (ref 0.0–1.5)
O2 SAT: 55.6 %
TOTAL HEMOGLOBIN: 10.5 g/dL — AB (ref 13.5–18.0)

## 2015-10-23 LAB — BASIC METABOLIC PANEL
Anion gap: 12 (ref 5–15)
BUN: 13 mg/dL (ref 6–20)
CALCIUM: 9.9 mg/dL (ref 8.9–10.3)
CO2: 23 mmol/L (ref 22–32)
Chloride: 100 mmol/L — ABNORMAL LOW (ref 101–111)
Creatinine, Ser: 0.71 mg/dL (ref 0.61–1.24)
GFR calc Af Amer: >60 mL/min (ref >60)
GLUCOSE: 94 mg/dL (ref 65–99)
Potassium: 4.5 mmol/L (ref 3.5–5.1)
Sodium: 135 mmol/L (ref 135–145)

## 2015-10-23 LAB — DIGOXIN LEVEL: DIGOXIN LVL: 0.9 ng/mL (ref 0.8–2.0)

## 2015-10-23 LAB — BRAIN NATRIURETIC PEPTIDE: B Natriuretic Peptide: 173.5 pg/mL — ABNORMAL HIGH (ref 0.0–100.0)

## 2015-10-23 MED ORDER — CARVEDILOL 3.125 MG PO TABS: 3.1250 mg | ORAL_TABLET | Freq: Two times a day (BID) | ORAL | Status: DC

## 2015-10-23 NOTE — Telephone Encounter (Signed)
Left pt mother message on next appt 11/10/15 @ 11:30 w/ mclean

## 2015-10-23 NOTE — Patient Instructions (Addendum)
Take coreg 3.125 twice a day.  Follow up in 2 weeks.  Labs drawn today.

## 2015-10-23 NOTE — Progress Notes (Signed)
Advanced Heart Failure Medication Review by a Pharmacist  Does the patient  feel that his/her medications are working for him/her?  yes  Has the patient been experiencing any side effects to the medications prescribed?  no  Does the patient measure his/her own blood pressure or blood glucose at home?  yes   Does the patient have any problems obtaining medications due to transportation or finances?   no  Understanding of regimen: good Understanding of indications: good Potential of compliance: good Patient understands to avoid NSAIDs. Patient understands to avoid decongestants.  Issues to address at subsequent visits: None   Pharmacist comments:  James Bennett is a pleasant 20 yo M presenting with his parents and a current medication list. He reports excellent compliance with his regimen and has felt well since milrinone was discontinued last week. He reports no significant side effects with any of his current medications and states that his heart rate has significantly decreased since milrinone was turned off.  James Bennett. James Bennett, PharmD, BCPS, CPP Clinical Pharmacist Pager: [phone] Phone: [phone] 10/23/2015 11:03 AM    Time with patient: 10 minutes Preparation and documentation time: 2 minutes Total time: 12 minutes

## 2015-10-25 NOTE — Progress Notes (Signed)
Patient ID: James Bennett, male   DOB: March 17, 1996, 20 y.o.   MRN: [id number]  Primary Cardiologist: Dr Shirlee Latch  Vascular: Dr Myra Gianotti  HPI: Quantae is a 20 year old with a no medical history until hospital admit February 20th, 2017  for acute dyspnea . He was admitted with acute respiratory failure complicated by cardiogenic shock, ischemic toes and RUE DVT. Due to low output he had ischemic toes. Vascular evaluated. He did not require surgical interventio. EF 15% by echo with mildly dilated and mildly dysfunctional RV. Low output HF with marked volume overload initially. Was on milrinone but able to titrate off initially. RHC on 2/23 showed good cardiac output off milrinone but still very volume overloaded. Milrinone restarted 2/25 for recurrent profound shock. Concern for viral myocarditis, has had viral-type symptoms for several weeks and has had antibiotic courses at home prior to admission. Coxsackie Antibody A + so suspect viral myocarditis (IgG rather than IgM, so not totally sure how recent exposure was). cMRI with EF 29%, poor contrast study so not able to comment on infiltrative disease. Failed milrinone wean x 2, went home on IV milrinone.  He continues to do well.  At last appointment, I stopped milrinone.  Co-ox today is 56%.  HR is now down to the 90s at home.  He is walking around and getting out of the house now.  No significant exertional dyspnea.  No orthopnea/PND.  Weight is down 1 lb.  Wearing Lifevest.  Labs (3/17): HCT 30.6, K 3.8, creatinine 0.74, co-ox 56%.   PMH: 1. Nonischemic cardiomyopathy: 2/17 admitted with cardiogenic shock.  Suspected acute myocarditis, coxsackievirus A positive.  He was started on milrinone, unable to titrate off.  - Echo (2/17) with EF 15%, diffuse hypokinesis, mildly decreased RV systolic function.  - Cardiac MRI (3/17) with EF 29%, moderately dilated LV, mildly decreased RV systolic function, unable to assess for late gadolinium enhancement (poor  quality contrast). - RHC (2/17) with mean RA 19, PA 51/32 mean 45, PCWP mean 40, CI 2.24 Fick/2.56 thermo.  - Blood type A+. 2. Ischemic toes: Suspect due to low output/cardiogenic shock. 3. RUE DVT: Now on coumadin.  4. NSVT: Lifevest  ROS: All systems negative except as listed in HPI, PMH and Problem List.  SH:  Social History   Social History  . Marital Status: Single    Spouse Name: N/A  . Number of Children: N/A  . Years of Education: N/A   Occupational History  . Not on file.   Social History Main Topics  . Smoking status: Never Smoker   . Smokeless tobacco: Not on file  . Alcohol Use: No  . Drug Use: No  . Sexual Activity: Not on file   Other Topics Concern  . 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 Bird exposure through his father's workplace of a small parrot. No mold exposure. No recent travel.    FH:  Family History  Problem Relation Age of Onset  . Lupus Mother     Current Outpatient Prescriptions  Medication Sig Dispense Refill  . acetaminophen (TYLENOL) 325 MG tablet Take 2 tablets (650 mg total) by mouth every 4 (four) hours as needed for headache or mild pain.    . benzonatate (TESSALON) 100 MG capsule Take 1 capsule (100 mg total) by mouth 3 (three) times daily as needed for cough. 45 capsule 0  . citalopram (CELEXA)  10 MG tablet Take 1 tablet (10 mg total) by mouth daily. 30 tablet 3  . digoxin (LANOXIN) 0.25 MG tablet Take 1 tablet (0.25 mg total) by mouth daily. 30 tablet 6  . famotidine (PEPCID) 20 MG tablet Take 1 tablet (20 mg total) by mouth 2 (two) times daily. 60 tablet 6  . hydrocortisone cream 1 % Apply topically every 2 (two) hours as needed for itching. 30 g 0  . ivabradine (CORLANOR) 7.5 MG TABS tablet Take 1 tablet (7.5 mg total) by mouth 2 (two) times daily with a meal. 60 tablet 6  . magnesium oxide (MAG-OX) 400 (241.3 Mg) MG tablet Take 1 tablet  (400 mg total) by mouth 2 (two) times daily. 60 tablet 6  . potassium chloride SA (K-DUR,KLOR-CON) 20 MEQ tablet Take 2 tablets (40 mEq total) by mouth daily. 60 tablet 6  . sacubitril-valsartan (ENTRESTO) 49-51 MG Take 1 tablet by mouth 2 (two) times daily. 60 tablet 3  . spironolactone (ALDACTONE) 25 MG tablet Take 1 tablet (25 mg total) by mouth daily. 30 tablet 6  . torsemide (DEMADEX) 20 MG tablet Take 1 tablet (20 mg total) by mouth daily. 30 tablet 6  . warfarin (COUMADIN) 10 MG tablet Take 1 tablet (10 mg total) by mouth daily. 30 tablet 6  . carvedilol (COREG) 3.125 MG tablet Take 1 tablet (3.125 mg total) by mouth 2 (two) times daily with a meal. 60 tablet 3   No current facility-administered medications for this encounter.    Filed Vitals:   10/23/15 1046  BP: 124/54  Pulse: 112  Height: 6\' 2"  (1.88 m)  Weight: 253 lb 1.9 oz (114.814 kg)  SpO2: 100%    PHYSICAL EXAM: General:  Well appearing. No resp difficulty. Wearing Lifevest  HEENT: normal Neck: supple. JVP 6-7  flat. Carotids 2+ bilaterally; no bruits. No lymphadenopathy or thryomegaly appreciated. Cor: PMI normal. Mildly tachy. Regular rate & rhythm. No rubs, gallops or murmurs. No S3  Lungs: clear Abdomen: soft, nontender, nondistended. No hepatosplenomegaly. No bruits or masses. Good bowel sounds. Extremities: no cyanosis, clubbing, rash, edema RUE PICC . Wearing off loading shoes.  Neuro: alert & orientedx3, cranial nerves grossly intact. Moves all 4 extremities w/o difficulty. Affect pleasant.  ASSESSMENT & PLAN: 1. Chronic systolic CHF:  2/17 admit with cardiogenic shock.  EF 15% by echo with mildly dilated and mildly dysfunctional RV. Was on milrinone but able to titrate off initially. RHC on 2/23 showed good cardiac output off milrinone but still very volume overloaded. Milrinone restarted 2/25 for recurrent profound shock. Concern for viral myocarditis, had had viral-type symptoms for several weeks and had  had antibiotic courses at home prior to admission. Coxsackie Antibody A + so suspect viral myocarditis (IgG rather than IgM, so not totally sure how recent exposure was). cMRI with EF 29%, poor contrast study so not able to comment on infiltrative disease. Failed milrinone wean x 2 in hospital. NYHA II. Volume status stable. We have weaned him off milrinone at home, co-ox today is acceptable at 56%. - He can stay off milrinone.  Will remove PICC.  - Continue torsemide 20 mg daily. - Continue digoxin 0.25, check level.   - Continue current spironolactone.  - Continue Entresto 49-51 mg twice a day.  - Continue ivabradine 7.5 bid.  - Start Coreg 3.125 mg bid.   - Check BMET/BNP today.  - Plan to repeat ECHO in 3 months after HF meds optimized (late 5/17). Will need Lifevest until repeat  ECHO.   2. Ischemic toes: Suspect due to low flow/low cardiac output => think unlikely to be cardioembolic with symmetry. No LV thrombus noted on echo.  Seeing Dr Myra Gianotti.   3. RUE DVT: Confirmed on Ultrasound 2/27. On coumadin, should continue at least 6 months.  [facility]R being drawn by [facility].  4. NSVT: Continue Lifevest arranged on discharge. Continue until ECHO repeated in 5/17.  Followup in 2 wks.   Marca Ancona 10/25/2015

## 2015-10-26 ENCOUNTER — Encounter: Payer: Self-pay | Admitting: Surgery

## 2015-10-26 ENCOUNTER — Ambulatory Visit (INDEPENDENT_AMBULATORY_CARE_PROVIDER_SITE_OTHER): Payer: BLUE CROSS/BLUE SHIELD | Admitting: Surgery

## 2015-10-26 VITALS — BP 102/65 | HR 96 | Temp 99.4°F | Ht 74.0 in | Wt 253.0 lb

## 2015-10-26 DIAGNOSIS — B3322 Viral myocarditis: Secondary | ICD-10-CM | POA: Diagnosis not present

## 2015-10-26 NOTE — Progress Notes (Signed)
Patient name: James Bennett MRN: [id number] DOB: 07-Oct-1995 Sex: male     Chief Complaint  Patient presents with  . Re-evaluation    2 wk f/u     HISTORY OF PRESENT ILLNESS: The patient initially presented to the hospital with viral myocarditis.  His ejection fraction went down to 15%.  During this time all 10 toes turned black.  He had palpable pulses.  We have been watching them demarcate.  He is placing Neosporin on them and using protective shoes.  He is on Coumadin for upper extremity DVT  Past Medical History  Diagnosis Date  . Bronchitis   . Deep vein thrombosis (DVT) of upper extremity (HCC) 09/22/2015    right  . Thrombocytopenia (HCC) 09/22/2015    Mild, transient    Past Surgical History  Procedure Laterality Date  . Cardiac catheterization N/A 09/17/2015    Procedure: Right Heart Cath;  Surgeon: Laurey Morale, MD;  Location: [facility] INVASIVE CV LAB;  Service: Cardiovascular;  Laterality: N/A;    Social History   Social History  . Marital Status: Single    Spouse Name: N/A  . Number of Children: N/A  . Years of Education: N/A   Occupational History  . Not on file.   Social History Main Topics  . Smoking status: Never Smoker   . Smokeless tobacco: Not on file  . Alcohol Use: No  . Drug Use: No  . Sexual Activity: Not on file   Other Topics Concern  . 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 Bird exposure through his father's workplace of a small parrot. No mold exposure. No recent travel.    Family History  Problem Relation Age of Onset  . Lupus Mother     Allergies as of 10/26/2015  . (No Known Allergies)    Current Outpatient Prescriptions on File Prior to Visit  Medication Sig Dispense Refill  . acetaminophen (TYLENOL) 325 MG tablet Take 2 tablets (650 mg total) by mouth every 4 (four) hours as needed for headache or mild pain.    . benzonatate (TESSALON) 100  MG capsule Take 1 capsule (100 mg total) by mouth 3 (three) times daily as needed for cough. 45 capsule 0  . carvedilol (COREG) 3.125 MG tablet Take 1 tablet (3.125 mg total) by mouth 2 (two) times daily with a meal. 60 tablet 3  . citalopram (CELEXA) 10 MG tablet Take 1 tablet (10 mg total) by mouth daily. 30 tablet 3  . digoxin (LANOXIN) 0.25 MG tablet Take 1 tablet (0.25 mg total) by mouth daily. 30 tablet 6  . famotidine (PEPCID) 20 MG tablet Take 1 tablet (20 mg total) by mouth 2 (two) times daily. 60 tablet 6  . hydrocortisone cream 1 % Apply topically every 2 (two) hours as needed for itching. 30 g 0  . ivabradine (CORLANOR) 7.5 MG TABS tablet Take 1 tablet (7.5 mg total) by mouth 2 (two) times daily with a meal. 60 tablet 6  . magnesium oxide (MAG-OX) 400 (241.3 Mg) MG tablet Take 1 tablet (400 mg total) by mouth 2 (two) times daily. 60 tablet 6  . potassium chloride SA (K-DUR,KLOR-CON) 20 MEQ tablet Take 2 tablets (40 mEq total) by mouth daily. 60 tablet 6  . sacubitril-valsartan (ENTRESTO) 49-51 MG Take 1 tablet by mouth 2 (two) times daily. 60 tablet 3  . spironolactone (ALDACTONE) 25 MG  tablet Take 1 tablet (25 mg total) by mouth daily. 30 tablet 6  . torsemide (DEMADEX) 20 MG tablet Take 1 tablet (20 mg total) by mouth daily. 30 tablet 6  . warfarin (COUMADIN) 10 MG tablet Take 1 tablet (10 mg total) by mouth daily. 30 tablet 6   No current facility-administered medications on file prior to visit.     REVIEW OF SYSTEMS: No changes from prior visit  PHYSICAL EXAMINATION:   Vital signs are  Filed Vitals:   10/26/15 1328  BP: 102/65  Pulse: 96  Temp: 99.4 F (37.4 C)  TempSrc: Oral  Height: 6\' 2"  (1.88 m)  Weight: 253 lb (114.76 kg)  SpO2: 97%   Body mass index is 32.47 kg/(m^2). General: The patient appears their stated age. HEENT:  No gross abnormalities Pulmonary:  Non labored breathing Musculoskeletal: There are no major deformities. Neurologic: No focal weakness  or paresthesias are detected, Skin: The tips of toes right 1 through 4 are with a dark eschar.  Underlying tissue is marginal.  No surrounding erythema.  The tips on the left toes 1 through 5 have a dry eschar on the tip.  This does appear to begin at the PIP joint.  No erythema Psychiatric: The patient has normal affect. 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  No indication for amputation currently.  The patient will likely need some form of amputation future, however there is still a chance that this area could auto amputate.  He will contact me if he develops any signs or symptoms of infection.  Otherwise he will follow up in one month  V. Charlena Cross, M.D. [facility] Office: [phone] Pager:  [phone]

## 2015-10-28 ENCOUNTER — Ambulatory Visit (INDEPENDENT_AMBULATORY_CARE_PROVIDER_SITE_OTHER): Payer: BLUE CROSS/BLUE SHIELD | Admitting: Internal Medicine

## 2015-10-28 DIAGNOSIS — I82621 Acute embolism and thrombosis of deep veins of right upper extremity: Secondary | ICD-10-CM

## 2015-10-28 DIAGNOSIS — Z7901 Long term (current) use of anticoagulants: Secondary | ICD-10-CM

## 2015-10-28 LAB — POCT INR: INR: 3.5

## 2015-11-04 ENCOUNTER — Ambulatory Visit (INDEPENDENT_AMBULATORY_CARE_PROVIDER_SITE_OTHER): Payer: BLUE CROSS/BLUE SHIELD | Admitting: *Deleted

## 2015-11-04 DIAGNOSIS — Z7901 Long term (current) use of anticoagulants: Secondary | ICD-10-CM

## 2015-11-04 DIAGNOSIS — I82621 Acute embolism and thrombosis of deep veins of right upper extremity: Secondary | ICD-10-CM | POA: Diagnosis not present

## 2015-11-04 LAB — POCT INR: INR: 2.7

## 2015-11-10 ENCOUNTER — Ambulatory Visit (HOSPITAL_COMMUNITY)
Admission: RE | Admit: 2015-11-10 | Discharge: 2015-11-10 | Disposition: A | Discharge status: Home / Self Care | Payer: BLUE CROSS/BLUE SHIELD | Source: Ambulatory Visit | Attending: Cardiology | Admitting: Cardiology

## 2015-11-10 VITALS — BP 122/78 | HR 92 | Wt 251.5 lb

## 2015-11-10 DIAGNOSIS — I428 Other cardiomyopathies: Secondary | ICD-10-CM | POA: Insufficient documentation

## 2015-11-10 DIAGNOSIS — Z7901 Long term (current) use of anticoagulants: Secondary | ICD-10-CM | POA: Insufficient documentation

## 2015-11-10 DIAGNOSIS — I471 Supraventricular tachycardia: Secondary | ICD-10-CM | POA: Insufficient documentation

## 2015-11-10 DIAGNOSIS — I5022 Chronic systolic (congestive) heart failure: Secondary | ICD-10-CM | POA: Diagnosis not present

## 2015-11-10 DIAGNOSIS — Z79899 Other long term (current) drug therapy: Secondary | ICD-10-CM | POA: Diagnosis not present

## 2015-11-10 DIAGNOSIS — I96 Gangrene, not elsewhere classified: Secondary | ICD-10-CM | POA: Insufficient documentation

## 2015-11-10 DIAGNOSIS — Z86718 Personal history of other venous thrombosis and embolism: Secondary | ICD-10-CM | POA: Diagnosis not present

## 2015-11-10 LAB — BASIC METABOLIC PANEL
ANION GAP: 13 (ref 5–15)
BUN: 14 mg/dL (ref 6–20)
CO2: 24 mmol/L (ref 22–32)
Calcium: 10.1 mg/dL (ref 8.9–10.3)
Chloride: 101 mmol/L (ref 101–111)
Creatinine, Ser: 0.73 mg/dL (ref 0.61–1.24)
GFR calc Af Amer: >60 mL/min (ref >60)
Glucose, Bld: 98 mg/dL (ref 65–99)
POTASSIUM: 4.2 mmol/L (ref 3.5–5.1)
SODIUM: 138 mmol/L (ref 135–145)

## 2015-11-10 MED ORDER — CARVEDILOL 6.25 MG PO TABS: ORAL_TABLET | ORAL | Status: DC

## 2015-11-10 NOTE — Progress Notes (Signed)
Patient ID: James Bennett, male   DOB: Apr 24, 1996, 20 y.o.   MRN: [id number]  Primary Cardiologist: Dr Shirlee Latch  Vascular: Dr Myra Gianotti  HPI: James Bennett is a 20 year old with a no medical history until hospital admit February 20th, 2017  for acute dyspnea . He was admitted with acute respiratory failure complicated by cardiogenic shock, ischemic toes and RUE DVT. Due to low output he had ischemic toes. Vascular evaluated. He did not require surgical interventio. EF 15% by echo with mildly dilated and mildly dysfunctional RV. Low output HF with marked volume overload initially. Was on milrinone but able to titrate off initially. RHC on 2/23 showed good cardiac output off milrinone but still very volume overloaded. Milrinone restarted 2/25 for recurrent profound shock. Concern for viral myocarditis, has had viral-type symptoms for several weeks and has had antibiotic courses at home prior to admission. Coxsackie Antibody A + so suspect viral myocarditis (IgG rather than IgM, so not totally sure how recent exposure was). cMRI with EF 29%, poor contrast study so not able to comment on infiltrative disease. Failed milrinone wean x 2, went home on IV milrinone.  However, we have been able to wean off milrinone as an outpatient.   He continues to do well.  HR is down a lot.  He is walking around and getting out of the house now.  No significant exertional dyspnea.  He has even driven a couple of times. No orthopnea/PND.  No palpitations or lightheadedness.  Weight is down 2 lbs.  Wearing Lifevest. Has dry gangrene of toe tips, followed by Dr Myra Gianotti.   Labs (3/17): HCT 30.6, K 3.8, creatinine 0.74, co-ox 56%.  Labs (4/17): K 4.5, creatinine 0.71, BNP 174, digoxin 0.9  PMH: 1. Nonischemic cardiomyopathy: 2/17 admitted with cardiogenic shock.  Suspected acute myocarditis, coxsackievirus A positive.  He was started on milrinone, titrated off in 3/17.  - Echo (2/17) with EF 15%, diffuse hypokinesis, mildly decreased  RV systolic function.  - Cardiac MRI (3/17) with EF 29%, moderately dilated LV, mildly decreased RV systolic function, unable to assess for late gadolinium enhancement (poor quality contrast). - RHC (2/17) with mean RA 19, PA 51/32 mean 45, PCWP mean 40, CI 2.24 Fick/2.56 thermo.  - Blood type A+. 2. Ischemic toes: Suspect due to low output/cardiogenic shock.  Now with dry gangrene.  3. RUE DVT: Now on coumadin.  4. NSVT: Lifevest  ROS: All systems negative except as listed in HPI, PMH and Problem List.  SH:  Social History   Social History  . Marital Status: Single    Spouse Name: N/A  . Number of Children: N/A  . Years of Education: N/A   Occupational History  . Not on file.   Social History Main Topics  . Smoking status: Never Smoker   . Smokeless tobacco: Not on file  . Alcohol Use: No  . Drug Use: No  . Sexual Activity: Not on file   Other Topics Concern  . 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 Bird exposure through his father's workplace of a small parrot. No mold exposure. No recent travel.    FH:  Family History  Problem Relation Age of Onset  . Lupus Mother     Current Outpatient Prescriptions  Medication Sig Dispense Refill  . acetaminophen (TYLENOL) 325 MG tablet Take 2 tablets (650 mg total) by mouth every 4 (four) hours as  needed for headache or mild pain.    . benzonatate (TESSALON) 100 MG capsule Take 1 capsule (100 mg total) by mouth 3 (three) times daily as needed for cough. 45 capsule 0  . carvedilol (COREG) 6.25 MG tablet Take 1 tab twice daily for 1 WEEK, then increase to 1.5 tabs (9.375 mg) twice daily. 90 tablet 6  . citalopram (CELEXA) 10 MG tablet Take 1 tablet (10 mg total) by mouth daily. 30 tablet 3  . digoxin (LANOXIN) 0.25 MG tablet Take 1 tablet (0.25 mg total) by mouth daily. 30 tablet 6  . famotidine (PEPCID) 20 MG tablet Take 1 tablet (20 mg total)  by mouth 2 (two) times daily. 60 tablet 6  . hydrocortisone cream 1 % Apply topically every 2 (two) hours as needed for itching. 30 g 0  . ivabradine (CORLANOR) 7.5 MG TABS tablet Take 1 tablet (7.5 mg total) by mouth 2 (two) times daily with a meal. 60 tablet 6  . magnesium oxide (MAG-OX) 400 (241.3 Mg) MG tablet Take 1 tablet (400 mg total) by mouth 2 (two) times daily. 60 tablet 6  . potassium chloride SA (K-DUR,KLOR-CON) 20 MEQ tablet Take 2 tablets (40 mEq total) by mouth daily. 60 tablet 6  . sacubitril-valsartan (ENTRESTO) 49-51 MG Take 1 tablet by mouth 2 (two) times daily. 60 tablet 3  . spironolactone (ALDACTONE) 25 MG tablet Take 1 tablet (25 mg total) by mouth daily. 30 tablet 6  . torsemide (DEMADEX) 20 MG tablet Take 1 tablet (20 mg total) by mouth daily. 30 tablet 6  . warfarin (COUMADIN) 10 MG tablet Take 1 tablet (10 mg total) by mouth daily. 30 tablet 6   No current facility-administered medications for this encounter.    Filed Vitals:   11/10/15 1150  BP: 122/78  Pulse: 92  Weight: 251 lb 8 oz (114.08 kg)  SpO2: 98%    PHYSICAL EXAM: General:  Well appearing. No resp difficulty. Wearing Lifevest  HEENT: normal Neck: supple. JVP 6-7  flat. Carotids 2+ bilaterally; no bruits. No lymphadenopathy or thryomegaly appreciated. Cor: PMI normal. Regular rate & rhythm. No rubs, gallops or murmurs. No S3  Lungs: clear Abdomen: soft, nontender, nondistended. No hepatosplenomegaly. No bruits or masses. Good bowel sounds. Extremities: no cyanosis, clubbing, rash, edema. Wearing off loading shoes.  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   Neuro: alert & orientedx3, cranial nerves grossly intact. Moves all 4 extremities w/o difficulty. Affect pleasant.  ASSESSMENT & PLAN: 1. Chronic systolic CHF:  2/17 admit with cardiogenic shock.  EF 15% by echo with mildly dilated and mildly dysfunctional RV. Was on milrinone but able to titrate off initially. RHC on 2/23 showed good  cardiac output off milrinone but still very volume overloaded. Milrinone restarted 2/25 for recurrent profound shock. Concern for viral myocarditis, had had viral-type symptoms for several weeks and had had antibiotic courses at home prior to admission. Coxsackie Antibody A + so suspect viral myocarditis (IgG rather than IgM, so not totally sure how recent exposure was). cMRI with EF 29%, poor contrast study so not able to comment on infiltrative disease. Failed milrinone wean x 2 in hospital. NYHA II. Volume status stable. We have been able to wean him off milrinone at home.  - Continue torsemide 20 mg daily. - Continue digoxin 0.25, level ok at 0.9 in 4/17.   - Continue current spironolactone.  - Continue Entresto 49-51 mg twice a day.  - Continue ivabradine 7.5 bid.  - Increase  Coreg to 6.25 mg bid.  In 1 week, if he tolerates 6.25 mg bid, increase to 9.375 mg bid.  - Plan to repeat ECHO in late 5/17. Will need Lifevest until repeat ECHO.   2. Ischemic toes: Now with dry gangrene.  Suspect due to low flow/low cardiac output => think unlikely to be cardioembolic with symmetry. No LV thrombus noted on echo.  Seeing Dr Myra Gianotti.   3. RUE DVT: Confirmed on Ultrasound 2/27. On coumadin, should continue at least 6 months.  INR being drawn by [facility].  4. NSVT: Continue Lifevest arranged on discharge. Continue until ECHO repeated in 5/17.  May need to appeal to his insurance, would keep if possible given runs of NSVT.   Followup in 3 wks.   Marca Ancona 11/10/2015

## 2015-11-10 NOTE — Progress Notes (Signed)
Advanced Heart Failure Medication Review by a Pharmacist  Does the patient  feel that his/her medications are working for him/her?  yes  Has the patient been experiencing any side effects to the medications prescribed?  no  Does the patient measure his/her own blood pressure or blood glucose at home?  yes   Does the patient have any problems obtaining medications due to transportation or finances?   no  Understanding of regimen: good Understanding of indications: good Potential of compliance: good Patient understands to avoid NSAIDs. Patient understands to avoid decongestants.  Issues to address at subsequent visits: None   Pharmacist comments:  James Bennett is a pleasant 20 yo M presenting with his parents but without a medication list. They have a great understanding of his regimen though and report great compliance. He does state that he is still having a productive cough without relief from use of benzonatate. I have provided them with a list of cough and cold OTC products which are safe for him to take. They did not have any other specific medication-related questions or concerns for me at this time.   [name]. Bonnye Fava, PharmD, BCPS, CPP Clinical Pharmacist Pager: [phone] Phone: [phone] 11/10/2015 12:25 PM    Time with patient: 10 minutes Preparation and documentation time: 2 minutes Total time: 12 minutes

## 2015-11-10 NOTE — Patient Instructions (Signed)
INCREASE Carvedilol (Coreg) to 6.25 mg x 1 WEEK, then increase to 9.375mg  (1.5 tabs) twice daily.  Routine lab work today. Will notify you of abnormal results, otherwise no news is good news!  Follow up 3 weeks with Dr. Shirlee Latch.  Do the following things EVERYDAY: 1) Weigh yourself in the morning before breakfast. Write it down and keep it in a log. 2) Take your medicines as prescribed 3) Eat low salt foods-Limit salt (sodium) to 2000 mg per day.  4) Stay as active as you can everyday 5) Limit all fluids for the day to less than 2 liters

## 2015-11-17 ENCOUNTER — Ambulatory Visit (INDEPENDENT_AMBULATORY_CARE_PROVIDER_SITE_OTHER): Payer: BLUE CROSS/BLUE SHIELD

## 2015-11-17 DIAGNOSIS — I82621 Acute embolism and thrombosis of deep veins of right upper extremity: Secondary | ICD-10-CM

## 2015-11-17 DIAGNOSIS — Z7901 Long term (current) use of anticoagulants: Secondary | ICD-10-CM | POA: Diagnosis not present

## 2015-11-17 LAB — POCT INR: INR: 2.5

## 2015-11-20 ENCOUNTER — Encounter: Payer: Self-pay | Admitting: Surgery

## 2015-11-29 ENCOUNTER — Telehealth: Payer: Self-pay | Admitting: Internal Medicine

## 2015-11-29 NOTE — Telephone Encounter (Signed)
Jillyn [place] called; father because with titration of coreg to 9.375, his son is experiencing slower HR ~ 40s and occasionally 30s. He may feel slightly more tired but not pre-syncopal or passing out. We discussed holding tonight's dose, and calling clinic tomorrow AM if remains low with goal HR between 50-60. We discussed warning signs and if he develops dizziness, hypotension or any other concerns then to call 911 or come to ER immediately. He expressed understanding.   Leeann Must, MD

## 2015-11-29 NOTE — Telephone Encounter (Signed)
Stay on Coreg at 6.25 mg bid and decrease ivabradine to 5 mg bid.

## 2015-11-30 ENCOUNTER — Ambulatory Visit (INDEPENDENT_AMBULATORY_CARE_PROVIDER_SITE_OTHER): Payer: BLUE CROSS/BLUE SHIELD | Admitting: Surgery

## 2015-11-30 ENCOUNTER — Encounter: Payer: Self-pay | Admitting: Surgery

## 2015-11-30 VITALS — BP 119/65 | HR 74 | Ht 74.0 in | Wt 251.0 lb

## 2015-11-30 DIAGNOSIS — B3322 Viral myocarditis: Secondary | ICD-10-CM | POA: Diagnosis not present

## 2015-11-30 NOTE — Progress Notes (Signed)
[name] name: James Bennett MRN: [id number] DOB: 30-Jun-1996 Sex: male  REASON FOR VISIT: f/u   HPI: James Bennett is a 20 y.o. male presents for follow-up of his gangrenous toes. He initially presented to the hospital with viral myocarditis. His ejection fraction dropped to 15%. We were consulted for ischemia of all ten toes.   The patient reports improvement in this sensation of his feet bilaterally. He says his toes look better. He does complain of pain to his right heel. He has been ambulating with Darco shoes. He has been applying Neosporin to his toes.  He no longer had a LifeVest. He is currently on Coumadin for a right upper extremity DVT. He will see cardiology soon.  Past Medical History  Diagnosis Date  . Bronchitis   . Deep vein thrombosis (DVT) of upper extremity (HCC) 09/22/2015    right  . Thrombocytopenia (HCC) 09/22/2015    Mild, transient    Family History  Problem Relation Age of Onset  . Lupus Mother     SOCIAL HISTORY: Social History  Substance Use Topics  . Smoking status: Never Smoker   . Smokeless tobacco: Not on file  . Alcohol Use: No    No Known Allergies  Current Outpatient Prescriptions  Medication Sig Dispense Refill  . acetaminophen (TYLENOL) 325 MG tablet Take 2 tablets (650 mg total) by mouth every 4 (four) hours as needed for headache or mild pain.    . benzonatate (TESSALON) 100 MG capsule Take 1 capsule (100 mg total) by mouth 3 (three) times daily as needed for cough. 45 capsule 0  . carvedilol (COREG) 6.25 MG tablet Take 1 tab twice daily for 1 WEEK, then increase to 1.5 tabs (9.375 mg) twice daily. 90 tablet 6  . citalopram (CELEXA) 10 MG tablet Take 1 tablet (10 mg total) by mouth daily. 30 tablet 3  . digoxin (LANOXIN) 0.25 MG tablet Take 1 tablet (0.25 mg total) by mouth daily. 30 tablet 6  . famotidine (PEPCID) 20 MG tablet Take 1 tablet (20 mg total) by mouth 2 (two) times daily. 60 tablet 6    . hydrocortisone cream 1 % Apply topically every 2 (two) hours as needed for itching. 30 g 0  . ivabradine (CORLANOR) 7.5 MG TABS tablet Take 1 tablet (7.5 mg total) by mouth 2 (two) times daily with a meal. 60 tablet 6  . magnesium oxide (MAG-OX) 400 (241.3 Mg) MG tablet Take 1 tablet (400 mg total) by mouth 2 (two) times daily. 60 tablet 6  . potassium chloride SA (K-DUR,KLOR-CON) 20 MEQ tablet Take 2 tablets (40 mEq total) by mouth daily. 60 tablet 6  . sacubitril-valsartan (ENTRESTO) 49-51 MG Take 1 tablet by mouth 2 (two) times daily. 60 tablet 3  . spironolactone (ALDACTONE) 25 MG tablet Take 1 tablet (25 mg total) by mouth daily. 30 tablet 6  . torsemide (DEMADEX) 20 MG tablet Take 1 tablet (20 mg total) by mouth daily. 30 tablet 6  . warfarin (COUMADIN) 10 MG tablet Take 1 tablet (10 mg total) by mouth daily. 30 tablet 6   No current facility-administered medications for this visit.    REVIEW OF SYSTEMS:   denotes positive finding,  denotes negative finding Cardiac  Comments:  Chest pain or chest pressure:    Shortness of breath upon exertion:    Short of breath when lying flat:    Irregular heart rhythm:  Vascular    Pain in calf, thigh, or hip brought on by ambulation:    Pain in feet at night that wakes you up from your sleep:     Blood clot in your veins:    Leg swelling:         Pulmonary    Oxygen at home:    Productive cough:     Wheezing:         Neurologic    Sudden weakness in arms or legs:     Sudden numbness in arms or legs:     Sudden onset of difficulty speaking or slurred speech:    Temporary loss of vision in one eye:     Problems with dizziness:         Gastrointestinal    Blood in stool:     Vomited blood:         Genitourinary    Burning when urinating:     Blood in urine:        Psychiatric    Major depression:         Hematologic    Bleeding problems:    Problems with blood clotting too easily:        Skin    Rashes or  ulcers:        Constitutional    Fever or chills:      PHYSICAL EXAM: Filed Vitals:   11/30/15 1354  BP: 119/65  Pulse: 74  Height: 6\' 2"  (1.88 m)  Weight: 251 lb (113.853 kg)  SpO2: 98%    GENERAL: The patient is a well-nourished male, in no acute distress. The vital signs are documented above. VASCULAR: Palpable pedal pulses bilaterally. MUSCULOSKELETAL: There are no major deformities or cyanosis. NEUROLOGIC: No focal weakness or paresthesias are detected. SKIN: Dry gangrene to tip of left second through fourth toes. Dry gangrene to tips of right first through fourth toes. Right heel with black eschar and yellow fibrinous tissue. Appears to be pressure sore.   MEDICAL ISSUES: Dry gangrene of toes bilaterally  The patient's toes continue to demarcate. There has been significant improvement since his last office visit one month ago. He has good blood flow to his feet bilaterally. The distal toes will likely auto amputate. He has a sore on the right heel that is likely a pressure sore. Advised the patient to ambulate on the ball of his right foot. He may discontinue his protective shoes. We will follow-up with him in 1 month.  Maris Berger, PA-C [facility]      I agree with the above.  The patient's toes continue to improve and demarcate.  He will continue with triple antibiotic ointment to the area.  Hopefully the toes will auto amputate.  We will need to pay close attention to the right heel ulcer.  I recommended nonweightbearing to that area.  He will follow up again in 1 month  [name]

## 2015-12-02 ENCOUNTER — Ambulatory Visit (INDEPENDENT_AMBULATORY_CARE_PROVIDER_SITE_OTHER): Payer: BLUE CROSS/BLUE SHIELD | Admitting: *Deleted

## 2015-12-02 ENCOUNTER — Ambulatory Visit (HOSPITAL_COMMUNITY)
Admission: RE | Admit: 2015-12-02 | Discharge: 2015-12-02 | Disposition: A | Discharge status: Home / Self Care | Payer: BLUE CROSS/BLUE SHIELD | Source: Ambulatory Visit | Attending: Cardiology | Admitting: Cardiology

## 2015-12-02 VITALS — BP 146/64 | HR 74 | Resp 18 | Wt 251.2 lb

## 2015-12-02 DIAGNOSIS — I428 Other cardiomyopathies: Secondary | ICD-10-CM | POA: Diagnosis not present

## 2015-12-02 DIAGNOSIS — Z86718 Personal history of other venous thrombosis and embolism: Secondary | ICD-10-CM | POA: Insufficient documentation

## 2015-12-02 DIAGNOSIS — I5022 Chronic systolic (congestive) heart failure: Secondary | ICD-10-CM | POA: Diagnosis not present

## 2015-12-02 DIAGNOSIS — I82621 Acute embolism and thrombosis of deep veins of right upper extremity: Secondary | ICD-10-CM | POA: Diagnosis not present

## 2015-12-02 DIAGNOSIS — Z79899 Other long term (current) drug therapy: Secondary | ICD-10-CM | POA: Diagnosis not present

## 2015-12-02 DIAGNOSIS — I96 Gangrene, not elsewhere classified: Secondary | ICD-10-CM | POA: Diagnosis not present

## 2015-12-02 DIAGNOSIS — F329 Major depressive disorder, single episode, unspecified: Secondary | ICD-10-CM | POA: Diagnosis not present

## 2015-12-02 DIAGNOSIS — Z7901 Long term (current) use of anticoagulants: Secondary | ICD-10-CM | POA: Diagnosis not present

## 2015-12-02 LAB — BASIC METABOLIC PANEL
Anion gap: 11 (ref 5–15)
BUN: 16 mg/dL (ref 6–20)
CHLORIDE: 106 mmol/L (ref 101–111)
CO2: 23 mmol/L (ref 22–32)
CREATININE: 0.79 mg/dL (ref 0.61–1.24)
Calcium: 10.4 mg/dL — ABNORMAL HIGH (ref 8.9–10.3)
GFR calc Af Amer: >60 mL/min (ref >60)
GFR calc non Af Amer: >60 mL/min (ref >60)
GLUCOSE: 95 mg/dL (ref 65–99)
Potassium: 4.6 mmol/L (ref 3.5–5.1)
SODIUM: 140 mmol/L (ref 135–145)

## 2015-12-02 LAB — DIGOXIN LEVEL: Digoxin Level: 1 ng/mL (ref 0.8–2.0)

## 2015-12-02 LAB — CBC
HCT: 38 % — ABNORMAL LOW (ref 39.0–52.0)
Hemoglobin: 11.6 g/dL — ABNORMAL LOW (ref 13.0–17.0)
MCH: 25.8 pg — ABNORMAL LOW (ref 26.0–34.0)
MCHC: 30.5 g/dL (ref 30.0–36.0)
MCV: 84.6 fL (ref 78.0–100.0)
PLATELETS: 345 10*3/uL (ref 150–400)
RBC: 4.49 MIL/uL (ref 4.22–5.81)
RDW: 19.9 % — AB (ref 11.5–15.5)
WBC: 6 10*3/uL (ref 4.0–10.5)

## 2015-12-02 LAB — POCT INR: INR: 2.6

## 2015-12-02 MED ORDER — SACUBITRIL-VALSARTAN 97-103 MG PO TABS: 1.0000 | ORAL_TABLET | Freq: Two times a day (BID) | ORAL | Status: DC

## 2015-12-02 MED ORDER — CITALOPRAM HYDROBROMIDE 20 MG PO TABS: 20.0000 mg | ORAL_TABLET | Freq: Every day | ORAL | Status: DC

## 2015-12-02 NOTE — Telephone Encounter (Signed)
Ok to file patient seen and evaluated  by MD on 12/02/15

## 2015-12-02 NOTE — Patient Instructions (Addendum)
INCREASE Celexa to 20 mg once daily.  INCREASE Entresto to 97/103 mg tabs twice daily.  Routine lab work today. Will notify you of abnormal results, otherwise no news is good news!  Follow up 1 month with Dr. Shirlee Latch and echocardiogram.  Do the following things EVERYDAY: 1) Weigh yourself in the morning before breakfast. Write it down and keep it in a log. 2) Take your medicines as prescribed 3) Eat low salt foods-Limit salt (sodium) to 2000 mg per day.  4) Stay as active as you can everyday 5) Limit all fluids for the day to less than 2 liters

## 2015-12-03 NOTE — Progress Notes (Signed)
Patient ID: James Bennett, male   DOB: 03-26-1996, 20 y.o.   MRN: [id number]  Primary Cardiologist: Dr Shirlee Latch  Vascular: Dr Myra Gianotti  HPI: James Bennett is a 20 year old with a no medical history until hospital admit February 20th, 2017  for acute dyspnea . He was admitted with acute respiratory failure complicated by cardiogenic shock, ischemic toes and RUE DVT. Due to low output he had ischemic toes. Vascular evaluated. He did not require surgical interventio. EF 15% by echo with mildly dilated and mildly dysfunctional RV. Low output HF with marked volume overload initially. Was on milrinone but able to titrate off initially. RHC on 2/23 showed good cardiac output off milrinone but still very volume overloaded. Milrinone restarted 2/25 for recurrent profound shock. Concern for viral myocarditis, has had viral-type symptoms for several weeks and has had antibiotic courses at home prior to admission. Coxsackie Antibody A + so suspect viral myocarditis (IgG rather than IgM, so not totally sure how recent exposure was). cMRI with EF 29%, poor contrast study so not able to comment on infiltrative disease. Failed milrinone wean x 2, went home on IV milrinone.  However, we were able to wean off milrinone as an outpatient.   He continues to do well.  At last appointment, I increase Coreg to 9.375 mg bid.  HR decreased to the upper 30s at times so he cut it back to 9.375 qam/6.25 qpm.  On this regimen, HR has been ok.  No exertional dyspnea now.  He is driving.  No orthopnea/PND.  No palpitations or lightheadedness.  He has some depression about his medical situation.  Weight is stable.  He has stopped wearing the Lifevest. Has dry gangrene of toe tips, followed by Dr Myra Gianotti.   Labs (3/17): HCT 30.6, K 3.8, creatinine 0.74, co-ox 56%.  Labs (4/17): K 4.5, creatinine 0.71 => 0.73, BNP 174, digoxin 0.9  PMH: 1. Nonischemic cardiomyopathy: 2/17 admitted with cardiogenic shock.  Suspected acute myocarditis,  coxsackievirus A positive.  He was started on milrinone, titrated off in 3/17.  - Echo (2/17) with EF 15%, diffuse hypokinesis, mildly decreased RV systolic function.  - Cardiac MRI (3/17) with EF 29%, moderately dilated LV, mildly decreased RV systolic function, unable to assess for late gadolinium enhancement (poor quality contrast). - RHC (2/17) with mean RA 19, PA 51/32 mean 45, PCWP mean 40, CI 2.24 Fick/2.56 thermo.  - Blood type A+. 2. Ischemic toes: Suspect due to low output/cardiogenic shock.  Now with dry gangrene.  3. RUE DVT: Now on coumadin.  4. NSVT: Lifevest 5. Depression  ROS: All systems negative except as listed in HPI, PMH and Problem List.  SH:  Social History   Social History  . Marital Status: Single    Spouse Name: N/A  . Number of Children: N/A  . Years of Education: N/A   Occupational History  . Not on file.   Social History Main Topics  . Smoking status: Never Smoker   . Smokeless tobacco: Not on file  . Alcohol Use: No  . Drug Use: No  . Sexual Activity: Not on file   Other Topics Concern  . 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 Bird exposure through his father's workplace of a small parrot. No mold exposure. No recent travel.    FH:  Family History  Problem Relation Age of Onset  . Lupus Mother  Current Outpatient Prescriptions  Medication Sig Dispense Refill  . acetaminophen (TYLENOL) 325 MG tablet Take 2 tablets (650 mg total) by mouth every 4 (four) hours as needed for headache or mild pain.    . benzonatate (TESSALON) 100 MG capsule Take 1 capsule (100 mg total) by mouth 3 (three) times daily as needed for cough. 45 capsule 0  . carvedilol (COREG) 6.25 MG tablet Take 6.25 mg by mouth 2 (two) times daily with a meal. Take 1.5 tabs in am and 1 tab in pm    . citalopram (CELEXA) 20 MG tablet Take 1 tablet (20 mg total) by mouth daily. 30 tablet 6  .  digoxin (LANOXIN) 0.25 MG tablet Take 1 tablet (0.25 mg total) by mouth daily. 30 tablet 6  . famotidine (PEPCID) 20 MG tablet Take 1 tablet (20 mg total) by mouth 2 (two) times daily. 60 tablet 6  . hydrocortisone cream 1 % Apply topically every 2 (two) hours as needed for itching. 30 g 0  . ivabradine (CORLANOR) 7.5 MG TABS tablet Take 1 tablet (7.5 mg total) by mouth 2 (two) times daily with a meal. 60 tablet 6  . magnesium oxide (MAG-OX) 400 (241.3 Mg) MG tablet Take 1 tablet (400 mg total) by mouth 2 (two) times daily. 60 tablet 6  . potassium chloride SA (K-DUR,KLOR-CON) 20 MEQ tablet Take 2 tablets (40 mEq total) by mouth daily. 60 tablet 6  . spironolactone (ALDACTONE) 25 MG tablet Take 1 tablet (25 mg total) by mouth daily. 30 tablet 6  . torsemide (DEMADEX) 20 MG tablet Take 1 tablet (20 mg total) by mouth daily. 30 tablet 6  . warfarin (COUMADIN) 10 MG tablet Take 1 tablet (10 mg total) by mouth daily. 30 tablet 6  . sacubitril-valsartan (ENTRESTO) 97-103 MG Take 1 tablet by mouth 2 (two) times daily. 60 tablet 6   No current facility-administered medications for this encounter.    Filed Vitals:   12/02/15 1359  BP: 146/64  Pulse: 74  Resp: 18  Weight: 251 lb 4 oz (113.966 kg)  SpO2: 100%    PHYSICAL EXAM: General:  Well appearing. No resp difficulty.  HEENT: normal Neck: supple. JVP 6-7. Carotids 2+ bilaterally; no bruits. No lymphadenopathy or thryomegaly appreciated. Cor: PMI normal. Regular rate & rhythm. No rubs, gallops or murmurs. No S3  Lungs: clear Abdomen: soft, nontender, nondistended. No hepatosplenomegaly. No bruits or masses. Good bowel sounds. Extremities: no cyanosis, clubbing, rash, edema. 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   Neuro: alert & orientedx3, cranial nerves grossly intact. Moves all 4 extremities w/o difficulty. Affect pleasant.  ASSESSMENT & PLAN: 1. Chronic systolic CHF:  2/17 admit with cardiogenic shock.  EF 15% by echo with  mildly dilated and mildly dysfunctional RV. Was on milrinone but able to titrate off initially. RHC on 2/23 showed good cardiac output off milrinone but still very volume overloaded. Milrinone restarted 2/25 for recurrent profound shock. Concern for viral myocarditis, had had viral-type symptoms for several weeks and had had antibiotic courses at home prior to admission. Coxsackie Antibody A + so suspect viral myocarditis (IgG rather than IgM, so not totally sure how recent exposure was). cMRI with EF 29%, poor contrast study so not able to comment on infiltrative disease. Failed milrinone wean x 2 in hospital. NYHA II. Volume status stable. We have been able to wean him off milrinone at home.  - Continue torsemide 20 mg daily.  BMET today.  - Continue digoxin 0.25,  level ok at 0.9 in 4/17. Repeat digoxin level today.  - Continue current spironolactone.  - Increase Entresto to 97/103 bid.  BMET in 2 wks.  - Continue ivabradine 7.5 bid => ideally would decrease this now to allow titration up of Coreg, but he just refilled it and wants to use what he has.  - Continue Coreg 9.375 qam/6.25 qpm.  At next appointment, will work work on cutting back on ivabradine to allow increased Coreg.  - Repeat echo to evaluate for need for ICD.  He would not be CRT candidate with narrow QRS.    2. Ischemic toes: Now with dry gangrene.  Suspect due to low flow/low cardiac output => think unlikely to be cardioembolic with symmetry. No LV thrombus noted on echo.  Seeing Dr Myra Gianotti.   3. RUE DVT: Confirmed on Ultrasound 2/27. On coumadin, should continue at least 6 months.   4. Depression: Increase Celexa to 20 mg daily.   Followup in 1 month.   Marca Ancona 12/03/2015

## 2015-12-08 ENCOUNTER — Telehealth (HOSPITAL_COMMUNITY): Payer: Self-pay | Admitting: *Deleted

## 2015-12-08 NOTE — Telephone Encounter (Signed)
Pt's dad called concerned about pt, he states for past couple of days pt has been having some dizziness/lightheadedness w/standing, once he stands a few minutes it passes.  He states when they check pt's BP at home it has been running good, maybe a little lower than it use to, around 117/60.  Pt's Sherryll Burger was just increased 5/10 to 97/103 BID, he also feels with hot weather pt may not be drinking enough fluid. He will monitor pt for next day or 2 and see if it improves, if not he will call us back, advised we may need to reduce Entresto or Torsemide dose if it continues.

## 2015-12-25 ENCOUNTER — Encounter: Payer: Self-pay | Admitting: Surgery

## 2016-01-04 ENCOUNTER — Encounter: Payer: Self-pay | Admitting: Surgery

## 2016-01-04 ENCOUNTER — Ambulatory Visit (INDEPENDENT_AMBULATORY_CARE_PROVIDER_SITE_OTHER): Payer: BLUE CROSS/BLUE SHIELD | Admitting: Surgery

## 2016-01-04 ENCOUNTER — Ambulatory Visit (INDEPENDENT_AMBULATORY_CARE_PROVIDER_SITE_OTHER): Payer: BLUE CROSS/BLUE SHIELD | Admitting: *Deleted

## 2016-01-04 VITALS — BP 113/68 | HR 60 | Temp 98.1°F | Resp 16 | Ht 74.0 in | Wt 246.0 lb

## 2016-01-04 DIAGNOSIS — Z7901 Long term (current) use of anticoagulants: Secondary | ICD-10-CM

## 2016-01-04 DIAGNOSIS — B3322 Viral myocarditis: Secondary | ICD-10-CM

## 2016-01-04 DIAGNOSIS — I82621 Acute embolism and thrombosis of deep veins of right upper extremity: Secondary | ICD-10-CM | POA: Diagnosis not present

## 2016-01-04 LAB — POCT INR: INR: 3.6

## 2016-01-04 NOTE — Progress Notes (Signed)
POST OPERATIVE OFFICE NOTE    CC:  F/u from hospital secondary to myocarditis dry gangrene multiple toes.  HPI:  This is a 20 y.o. male who is  presents for follow-up of his gangrenous toes. He initially presented to the hospital with viral myocarditis. His ejection fraction dropped to 15%. We were consulted for ischemia of all ten toes. He is now wearing saddles and is more active.  He is currently on Coumadin for a right upper extremity DVT.  He will be on coumadin for 6 months.    At his last cardiologist appointment they increase Coreg to 9.375 mg bid. HR decreased to the upper 30s at times so he cut it back to 9.375 qam/6.25 qpm. On this regimen, HR has been ok. No exertional dyspnea now. He is driving. No orthopnea/PND. No palpitations or lightheadedness.  He has developed some depression which is being managed with Celexa.  This was increased to 20 mg /day.    No Known Allergies  Current Outpatient Prescriptions  Medication Sig Dispense Refill  . acetaminophen (TYLENOL) 325 MG tablet Take 2 tablets (650 mg total) by mouth every 4 (four) hours as needed for headache or mild pain.    . benzonatate (TESSALON) 100 MG capsule Take 1 capsule (100 mg total) by mouth 3 (three) times daily as needed for cough. 45 capsule 0  . carvedilol (COREG) 6.25 MG tablet Take 6.25 mg by mouth 2 (two) times daily with a meal. Take 1.5 tabs in am and 1 tab in pm    . citalopram (CELEXA) 20 MG tablet Take 1 tablet (20 mg total) by mouth daily. 30 tablet 6  . digoxin (LANOXIN) 0.25 MG tablet Take 1 tablet (0.25 mg total) by mouth daily. 30 tablet 6  . famotidine (PEPCID) 20 MG tablet Take 1 tablet (20 mg total) by mouth 2 (two) times daily. 60 tablet 6  . hydrocortisone cream 1 % Apply topically every 2 (two) hours as needed for itching. 30 g 0  . ivabradine (CORLANOR) 7.5 MG TABS tablet Take 1 tablet (7.5 mg total) by mouth 2 (two) times daily with a meal. 60 tablet 6  . magnesium oxide (MAG-OX) 400  (241.3 Mg) MG tablet Take 1 tablet (400 mg total) by mouth 2 (two) times daily. 60 tablet 6  . potassium chloride SA (K-DUR,KLOR-CON) 20 MEQ tablet Take 2 tablets (40 mEq total) by mouth daily. 60 tablet 6  . sacubitril-valsartan (ENTRESTO) 97-103 MG Take 1 tablet by mouth 2 (two) times daily. 60 tablet 6  . spironolactone (ALDACTONE) 25 MG tablet Take 1 tablet (25 mg total) by mouth daily. 30 tablet 6  . torsemide (DEMADEX) 20 MG tablet Take 1 tablet (20 mg total) by mouth daily. 30 tablet 6  . warfarin (COUMADIN) 10 MG tablet Take 1 tablet (10 mg total) by mouth daily. 30 tablet 6   No current facility-administered medications for this visit.     ROS:  See HPI  Physical Exam:  Filed Vitals:   01/04/16 1335  BP: 113/68  Pulse: 60  Temp: 98.1 F (36.7 C)  Resp: 16    Heart RRR Extremities:  Right 2-4th toes tip gangrene and left 1-4th toes tip gangrene.  He has palpable DP 2+ pulses equal bilaterally Right plantar heel beefy red base with serious drainage Lungs CTA  Assessment/Plan:  This is a 20 y.o. male who is s/p: acute respiratory failure complicated by cardiogenic shock, ischemic toes and RUE DVT. Due to low output he  had ischemic toes.   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  We will let the toes continue to declare themselves.  Wet to dry dressing on the right hell.      Thomasena Edis, EMMA MAUREEN PA-C [facility]   Clinic MD:  Pt seen and examined with Dr. Myra Gianotti  I debrided the right third and fourth toe and remove the eschar.  There is bone exposed on the right third toe.  I also debrided off the end of one of the left toes.  There was excellent bleeding.  I'm then have the patient come back in one month.  I suspect he'll need to go to the operating room to trim up the tips of the bone to get the skin to heal over it.  I like to try to do all toes of the same time.  Therefore, I'll give him 1 more month to try to maximize  skin and toe salvage  Wells Laira Penninger

## 2016-01-11 ENCOUNTER — Encounter (HOSPITAL_COMMUNITY): Payer: Self-pay

## 2016-01-11 ENCOUNTER — Ambulatory Visit (HOSPITAL_BASED_OUTPATIENT_CLINIC_OR_DEPARTMENT_OTHER)
Admission: RE | Admit: 2016-01-11 | Discharge: 2016-01-11 | Disposition: A | Discharge status: Home / Self Care | Payer: BLUE CROSS/BLUE SHIELD | Source: Ambulatory Visit | Attending: Cardiology | Admitting: Cardiology

## 2016-01-11 ENCOUNTER — Ambulatory Visit (HOSPITAL_COMMUNITY)
Admission: RE | Admit: 2016-01-11 | Discharge: 2016-01-11 | Disposition: A | Discharge status: Home / Self Care | Payer: BLUE CROSS/BLUE SHIELD | Source: Ambulatory Visit | Attending: Cardiology | Admitting: Cardiology

## 2016-01-11 VITALS — BP 118/68 | HR 63 | Wt 249.0 lb

## 2016-01-11 DIAGNOSIS — I5022 Chronic systolic (congestive) heart failure: Secondary | ICD-10-CM | POA: Diagnosis not present

## 2016-01-11 DIAGNOSIS — I5021 Acute systolic (congestive) heart failure: Secondary | ICD-10-CM

## 2016-01-11 DIAGNOSIS — I82621 Acute embolism and thrombosis of deep veins of right upper extremity: Secondary | ICD-10-CM

## 2016-01-11 DIAGNOSIS — I517 Cardiomegaly: Secondary | ICD-10-CM | POA: Insufficient documentation

## 2016-01-11 DIAGNOSIS — I340 Nonrheumatic mitral (valve) insufficiency: Secondary | ICD-10-CM | POA: Insufficient documentation

## 2016-01-11 DIAGNOSIS — I272 Other secondary pulmonary hypertension: Secondary | ICD-10-CM | POA: Insufficient documentation

## 2016-01-11 LAB — BASIC METABOLIC PANEL
ANION GAP: 9 (ref 5–15)
BUN: 8 mg/dL (ref 6–20)
CALCIUM: 10.3 mg/dL (ref 8.9–10.3)
CO2: 27 mmol/L (ref 22–32)
Chloride: 104 mmol/L (ref 101–111)
Creatinine, Ser: 0.75 mg/dL (ref 0.61–1.24)
GFR calc Af Amer: >60 mL/min (ref >60)
Glucose, Bld: 89 mg/dL (ref 65–99)
Potassium: 3.8 mmol/L (ref 3.5–5.1)
Sodium: 140 mmol/L (ref 135–145)

## 2016-01-11 LAB — ECHOCARDIOGRAM COMPLETE
CHL CUP MV DEC (S): 162
CHL CUP TV REG PEAK VELOCITY: 312 cm/s
E decel time: 162 msec
E/e' ratio: 5.36
FS: 15 % — AB (ref 28–44)
IV/PV OW: 0.97
LA ID, A-P, ES: 44 mm
LA diam end sys: 44 mm
LA vol: 71.8 mL
LADIAMINDEX: 1.86 cm/m2
LAVOLA4C: 61.8 mL
LAVOLIN: 30.3 mL/m2
LV PW d: 12.6 mm — AB (ref 0.6–1.1)
LVEEAVG: 5.36
LVEEMED: 5.36
LVELAT: 16 cm/s
LVOT area: 3.46 cm2
LVOT diameter: 21 mm
MV Peak grad: 3 mmHg
MV pk E vel: 85.7 m/s
MVPKAVEL: 79.3 m/s
TAPSE: 23.1 mm
TDI e' lateral: 16
TDI e' medial: 9.25
TR max vel: 312 cm/s

## 2016-01-11 MED ORDER — IVABRADINE HCL 5 MG PO TABS: 5.0000 mg | ORAL_TABLET | Freq: Two times a day (BID) | ORAL | Status: DC

## 2016-01-11 MED ORDER — CARVEDILOL 6.25 MG PO TABS: 9.3750 mg | ORAL_TABLET | Freq: Two times a day (BID) | ORAL | Status: DC

## 2016-01-11 MED ORDER — DIGOXIN 250 MCG PO TABS: 0.1250 mg | ORAL_TABLET | Freq: Every day | ORAL | Status: DC

## 2016-01-11 NOTE — Patient Instructions (Signed)
Decrease Digoxin to 0.125mg  daily.  Decrease Corlanor to 5mg  twice daily.  Increase Carvedilol to 9.375mg  twice daily.  Routine lab work today. Will notify you of abnormal results  Follow up with Dr.McLean in 6 weeks

## 2016-01-11 NOTE — Progress Notes (Signed)
*  PRELIMINARY RESULTS* Echocardiogram 2D Echocardiogram has been performed.  James Bennett 01/11/2016, 10:49 AM

## 2016-01-12 NOTE — Progress Notes (Signed)
Patient ID: James Bennett, male   DOB: 15-Sep-1995, 20 y.o.   MRN: [id number]  Primary Cardiologist: Dr James Bennett  Vascular: Dr James Bennett  HPI: James Bennett is a 20 year old with a no medical history until hospital admit February 20th, 2017  for acute dyspnea . He was admitted with acute respiratory failure complicated by cardiogenic shock, ischemic toes and RUE DVT. Due to low output he had ischemic toes. Vascular evaluated. He did not require surgical interventio. EF 15% by echo with mildly dilated and mildly dysfunctional RV. Low output HF with marked volume overload initially. Was on milrinone but able to titrate off initially. RHC on 2/23 showed good cardiac output off milrinone but still very volume overloaded. Milrinone restarted 2/25 for recurrent profound shock. Concern for viral myocarditis, has had viral-type symptoms for several weeks and has had antibiotic courses at home prior to admission. Coxsackie Antibody A + so suspect viral myocarditis (IgG rather than IgM, so not totally sure how recent exposure was). cMRI with EF 29%, poor contrast study so not able to comment on infiltrative disease. Failed milrinone wean x 2, went home on IV milrinone.  However, we were able to wean off milrinone as an outpatient.   He continues to do well.  Weight down 2 lbs. 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  No exertional dyspnea.  No fatigue.  No orthopnea/PND.  No lightheadedness/syncope. Has dry gangrene of toe tips, followed by Dr James Bennett. Limited by toe soreness more than anything else.    Echo today showed EF 35-40% with normal diastolic function and normal RV.   Labs (3/17): HCT 30.6, K 3.8, creatinine 0.74, co-ox 56%.  Labs (4/17): K 4.5, creatinine 0.71 => 0.73, BNP 174, digoxin 0.9 Labs (5/17): K 4.6, creatinine 0.79, digoxin 1.0, HCT 38  PMH: 1. Nonischemic cardiomyopathy: 2/17 admitted with cardiogenic shock.  Suspected acute myocarditis, coxsackievirus A positive.  He  was started on milrinone, titrated off in 3/17.  - Echo (2/17) with EF 15%, diffuse hypokinesis, mildly decreased RV systolic function.  - Cardiac MRI (3/17) with EF 29%, moderately dilated LV, mildly decreased RV systolic function, unable to assess for late gadolinium enhancement (poor quality contrast). - RHC (2/17) with mean RA 19, PA 51/32 mean 45, PCWP mean 40, CI 2.24 Fick/2.56 thermo.  - Blood type A+. - Echo (6/17): EF 35-40%, normal diastolic function, normal RV size and systolic function, PASP 42 mmHg, GLS-16.4%.  2. Ischemic toes: Suspect due to low output/cardiogenic shock.  Now with dry gangrene.  3. RUE DVT: Now on coumadin.  4. NSVT: Lifevest 5. Depression  ROS: All systems negative except as listed in HPI, PMH and Problem List.  SH:  Social History   Social History  . Marital Status: Single    Spouse Name: James Bennett  . Number of Children: James Bennett  . Years of Education: James Bennett   Occupational History  . Not on file.   Social History Main Topics  . Smoking status: Never Smoker   . Smokeless tobacco: Not on file  . Alcohol Use: No  . Drug Use: No  . Sexual Activity: Not on file   Other Topics Concern  . 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 Bird exposure through his father's workplace of a small parrot. No mold exposure. No recent travel.    FH:  Family History  Problem Relation Age of Onset  .  Lupus Mother     Current Outpatient Prescriptions  Medication Sig Dispense Refill  . acetaminophen (TYLENOL) 325 MG tablet Take 2 tablets (650 mg total) by mouth every 4 (four) hours as needed for headache or mild pain.    . carvedilol (COREG) 6.25 MG tablet Take 1.5 tablets (9.375 mg total) by mouth 2 (two) times daily with a meal. 90 tablet 3  . citalopram (CELEXA) 20 MG tablet Take 1 tablet (20 mg total) by mouth daily. 30 tablet 6  . digoxin (LANOXIN) 0.25 MG tablet Take 0.5 tablets  (0.125 mg total) by mouth daily. 15 tablet 6  . famotidine (PEPCID) 20 MG tablet Take 1 tablet (20 mg total) by mouth 2 (two) times daily. 60 tablet 6  . hydrocortisone cream 1 % Apply topically every 2 (two) hours as needed for itching. 30 g 0  . ivabradine (CORLANOR) 5 MG TABS tablet Take 1 tablet (5 mg total) by mouth 2 (two) times daily with a meal. 60 tablet 3  . magnesium oxide (MAG-OX) 400 (241.3 Mg) MG tablet Take 1 tablet (400 mg total) by mouth 2 (two) times daily. 60 tablet 6  . potassium chloride SA (K-DUR,KLOR-CON) 20 MEQ tablet Take 2 tablets (40 mEq total) by mouth daily. 60 tablet 6  . sacubitril-valsartan (ENTRESTO) 97-103 MG Take 1 tablet by mouth 2 (two) times daily. 60 tablet 6  . spironolactone (ALDACTONE) 25 MG tablet Take 1 tablet (25 mg total) by mouth daily. 30 tablet 6  . torsemide (DEMADEX) 20 MG tablet Take 1 tablet (20 mg total) by mouth daily. 30 tablet 6  . warfarin (COUMADIN) 10 MG tablet Take 1 tablet (10 mg total) by mouth daily. 30 tablet 6   No current facility-administered medications for this encounter.    Filed Vitals:   01/11/16 1109  BP: 118/68  Pulse: 63  Weight: 249 lb (112.946 kg)  SpO2: 99%    PHYSICAL EXAM: General:  Well appearing. No resp difficulty.  HEENT: normal Neck: supple. JVP 6-7. Carotids 2+ bilaterally; no bruits. No lymphadenopathy or thryomegaly appreciated. Cor: PMI normal. Regular rate & rhythm. No rubs, gallops or murmurs. No S3  Lungs: clear Abdomen: soft, nontender, nondistended. No hepatosplenomegaly. No bruits or masses. Good bowel sounds. Extremities: no cyanosis, clubbing, rash, edema. 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   Neuro: alert & orientedx3, cranial nerves grossly intact. Moves all 4 extremities w/o difficulty. Affect pleasant.  ASSESSMENT & PLAN: 1. Chronic systolic CHF:  2/17 admit with cardiogenic shock.  EF 15% by echo with mildly dilated and mildly dysfunctional RV. Was on milrinone but able  to titrate off initially. RHC on 2/23 showed good cardiac output off milrinone but still very volume overloaded. Milrinone restarted 2/25 for recurrent profound shock. Concern for viral myocarditis, had had viral-type symptoms for several weeks and had had antibiotic courses at home prior to admission. Coxsackie Antibody A + so suspect viral myocarditis (IgG rather than IgM, so not totally sure how recent exposure was). cMRI with EF 29%, poor contrast study so not able to comment on infiltrative disease. Failed milrinone wean x 2 in hospital but weaned off at home. Echo today with EF up to 35-40% and normal diastolic function, normal RV.  NYHA II. Volume status stable.  - Digoxin level has been on the higher side, decrease to 0.125 mg daily.   - Continue torsemide 20 mg daily.  BMET today.  - Continue current spironolactone.  - Continue Entresto 97/103 bid.   -  HR dropped last time I increased Coreg.  Today, I will decrease ivabradine to 5 mg bid and increase Coreg to 9.375 mg bid.   - Repeat echo at 6 months.  Currently appears just outside ICD range.  He would not be CRT candidate with narrow QRS.    2. Ischemic toes: Now with dry gangrene.  Suspect due to low flow/low cardiac output => think unlikely to be cardioembolic with symmetry. No LV thrombus noted on echo.  Seeing Dr James Bennett.   3. RUE DVT: Confirmed on Ultrasound 2/27. On coumadin, should continue at least 6 months to end of 8/17.   4. Depression: Continue Celexa 20 mg daily.   Followup in 6 wks.   Marca Ancona 01/12/2016

## 2016-01-20 ENCOUNTER — Encounter: Payer: Self-pay | Admitting: Surgery

## 2016-01-21 ENCOUNTER — Ambulatory Visit (INDEPENDENT_AMBULATORY_CARE_PROVIDER_SITE_OTHER): Payer: BLUE CROSS/BLUE SHIELD | Admitting: *Deleted

## 2016-01-21 DIAGNOSIS — Z7901 Long term (current) use of anticoagulants: Secondary | ICD-10-CM | POA: Diagnosis not present

## 2016-01-21 DIAGNOSIS — I82621 Acute embolism and thrombosis of deep veins of right upper extremity: Secondary | ICD-10-CM | POA: Diagnosis not present

## 2016-01-21 LAB — POCT INR: INR: 2.8

## 2016-02-01 ENCOUNTER — Ambulatory Visit: Payer: BLUE CROSS/BLUE SHIELD | Admitting: Surgery

## 2016-02-04 ENCOUNTER — Encounter: Payer: Self-pay | Admitting: Surgery

## 2016-02-08 ENCOUNTER — Ambulatory Visit: Payer: BLUE CROSS/BLUE SHIELD | Admitting: Surgery

## 2016-02-22 ENCOUNTER — Ambulatory Visit (HOSPITAL_COMMUNITY)
Admission: RE | Admit: 2016-02-22 | Discharge: 2016-02-22 | Disposition: A | Discharge status: Home / Self Care | Payer: BLUE CROSS/BLUE SHIELD | Source: Ambulatory Visit | Attending: Cardiology | Admitting: Cardiology

## 2016-02-22 ENCOUNTER — Encounter (HOSPITAL_COMMUNITY): Payer: Self-pay

## 2016-02-22 VITALS — BP 128/68 | HR 86 | Wt 252.8 lb

## 2016-02-22 DIAGNOSIS — I96 Gangrene, not elsewhere classified: Secondary | ICD-10-CM | POA: Insufficient documentation

## 2016-02-22 DIAGNOSIS — I428 Other cardiomyopathies: Secondary | ICD-10-CM | POA: Diagnosis not present

## 2016-02-22 DIAGNOSIS — Z7901 Long term (current) use of anticoagulants: Secondary | ICD-10-CM | POA: Diagnosis not present

## 2016-02-22 DIAGNOSIS — Z86718 Personal history of other venous thrombosis and embolism: Secondary | ICD-10-CM | POA: Diagnosis not present

## 2016-02-22 DIAGNOSIS — Z79899 Other long term (current) drug therapy: Secondary | ICD-10-CM | POA: Diagnosis not present

## 2016-02-22 DIAGNOSIS — I5022 Chronic systolic (congestive) heart failure: Secondary | ICD-10-CM | POA: Diagnosis present

## 2016-02-22 DIAGNOSIS — F329 Major depressive disorder, single episode, unspecified: Secondary | ICD-10-CM | POA: Diagnosis not present

## 2016-02-22 LAB — DIGOXIN LEVEL: Digoxin Level: 0.5 ng/mL — ABNORMAL LOW (ref 0.8–2.0)

## 2016-02-22 LAB — BASIC METABOLIC PANEL
Anion gap: 10 (ref 5–15)
BUN: 12 mg/dL (ref 6–20)
CALCIUM: 9.9 mg/dL (ref 8.9–10.3)
CHLORIDE: 104 mmol/L (ref 101–111)
CO2: 22 mmol/L (ref 22–32)
CREATININE: 0.65 mg/dL (ref 0.61–1.24)
Glucose, Bld: 132 mg/dL — ABNORMAL HIGH (ref 65–99)
Potassium: 3.9 mmol/L (ref 3.5–5.1)
SODIUM: 136 mmol/L (ref 135–145)

## 2016-02-22 LAB — PROTIME-INR
INR: 1.94
PROTHROMBIN TIME: 22.4 s — AB (ref 11.4–15.2)

## 2016-02-22 MED ORDER — CARVEDILOL 12.5 MG PO TABS: 12.5000 mg | ORAL_TABLET | Freq: Two times a day (BID) | ORAL | 3 refills | Status: DC

## 2016-02-22 MED ORDER — IVABRADINE HCL 5 MG PO TABS: 2.5000 mg | ORAL_TABLET | Freq: Two times a day (BID) | ORAL | 3 refills | Status: DC

## 2016-02-22 NOTE — Patient Instructions (Signed)
Routine lab work today. Will notify you of abnormal results  Increase Coreg to 12.5mg  twice daily.  Decrease Corlanor to 2.5mg  twice daily.  Stop Coumadin on August 31st 2017.  Follow up with Dr.McLean in 6 weeks.

## 2016-02-22 NOTE — Progress Notes (Signed)
Patient ID: James Bennett, male   DOB: 01-07-1996, 20 y.o.   MRN: [id number]  Primary Cardiologist: Dr Shirlee Latch  Vascular: Dr Myra Gianotti  HPI: James Bennett is a 20 year old with a no medical history until hospital admit February 20th, 2017  for acute dyspnea . He was admitted with acute respiratory failure complicated by cardiogenic shock, ischemic toes and RUE DVT. Due to low output he had ischemic toes. Vascular evaluated. He did not require surgical interventio. EF 15% by echo with mildly dilated and mildly dysfunctional RV. Low output HF with marked volume overload initially. Was on milrinone but able to titrate off initially. RHC on 2/23 showed good cardiac output off milrinone but still very volume overloaded. Milrinone restarted 2/25 for recurrent profound shock. Concern for viral myocarditis, has had viral-type symptoms for several weeks and has had antibiotic courses at home prior to admission. Coxsackie Antibody A + so suspect viral myocarditis (IgG rather than IgM, so not totally sure how recent exposure was). cMRI with EF 29%, poor contrast study so not able to comment on infiltrative disease. Failed milrinone wean x 2, went home on IV milrinone.  However, we were able to wean off milrinone as an outpatient.   He continues to do well.  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  No exertional dyspnea.  No fatigue. No orthopnea/PND.  No lightheadedness/syncope. Has dry gangrene of toe tips, followed by Dr Myra Gianotti. Limited by toe soreness more than anything else.    Echo in 6/17 showed EF 35-40% with normal diastolic function and normal RV.   Labs (3/17): HCT 30.6, K 3.8, creatinine 0.74, co-ox 56%.  Labs (4/17): K 4.5, creatinine 0.71 => 0.73, BNP 174, digoxin 0.9 Labs (5/17): K 4.6, creatinine 0.79, digoxin 1.0, HCT 38  PMH: 1. Nonischemic cardiomyopathy: 2/17 admitted with cardiogenic shock.  Suspected acute myocarditis, coxsackievirus A positive.  He was started on  milrinone, titrated off in 3/17.  - Echo (2/17) with EF 15%, diffuse hypokinesis, mildly decreased RV systolic function.  - Cardiac MRI (3/17) with EF 29%, moderately dilated LV, mildly decreased RV systolic function, unable to assess for late gadolinium enhancement (poor quality contrast). - RHC (2/17) with mean RA 19, PA 51/32 mean 45, PCWP mean 40, CI 2.24 Fick/2.56 thermo.  - Blood type A+. - Echo (6/17): EF 35-40%, normal diastolic function, normal RV size and systolic function, PASP 42 mmHg, GLS-16.4%.  2. Ischemic toes: Suspect due to low output/cardiogenic shock.  Now with dry gangrene.  3. RUE DVT: Now on coumadin.  4. NSVT: Lifevest 5. Depression  ROS: All systems negative except as listed in HPI, PMH and Problem List.  SH:  Social History   Social History  . Marital status: Single    Spouse name: N/A  . Number of children: N/A  . Years of education: N/A   Occupational History  . Not on file.   Social History Main Topics  . Smoking status: Never Smoker  . Smokeless tobacco: Not on file  . Alcohol use No  . Drug use: No  . Sexual activity: Not on file   Other Topics Concern  . 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 Bird exposure through his father's workplace of a small parrot. No mold exposure. No recent travel.    FH:  Family History  Problem Relation Age of Onset  . Lupus Mother  Current Outpatient Prescriptions  Medication Sig Dispense Refill  . acetaminophen (TYLENOL) 325 MG tablet Take 2 tablets (650 mg total) by mouth every 4 (four) hours as needed for headache or mild pain.    . carvedilol (COREG) 12.5 MG tablet Take 1 tablet (12.5 mg total) by mouth 2 (two) times daily with a meal. 60 tablet 3  . citalopram (CELEXA) 20 MG tablet Take 1 tablet (20 mg total) by mouth daily. 30 tablet 6  . digoxin (LANOXIN) 0.25 MG tablet Take 0.5 tablets (0.125 mg total) by mouth  daily. 15 tablet 6  . famotidine (PEPCID) 20 MG tablet Take 1 tablet (20 mg total) by mouth 2 (two) times daily. 60 tablet 6  . hydrocortisone cream 1 % Apply topically every 2 (two) hours as needed for itching. 30 g 0  . ivabradine (CORLANOR) 5 MG TABS tablet Take 0.5 tablets (2.5 mg total) by mouth 2 (two) times daily with a meal. 30 tablet 3  . magnesium oxide (MAG-OX) 400 (241.3 Mg) MG tablet Take 1 tablet (400 mg total) by mouth 2 (two) times daily. 60 tablet 6  . potassium chloride SA (K-DUR,KLOR-CON) 20 MEQ tablet Take 2 tablets (40 mEq total) by mouth daily. 60 tablet 6  . sacubitril-valsartan (ENTRESTO) 97-103 MG Take 1 tablet by mouth 2 (two) times daily. 60 tablet 6  . spironolactone (ALDACTONE) 25 MG tablet Take 1 tablet (25 mg total) by mouth daily. 30 tablet 6  . torsemide (DEMADEX) 20 MG tablet Take 1 tablet (20 mg total) by mouth daily. 30 tablet 6  . warfarin (COUMADIN) 10 MG tablet Take 1 tablet (10 mg total) by mouth daily. 30 tablet 6   No current facility-administered medications for this encounter.     Vitals:   02/22/16 1118  BP: 128/68  Pulse: 86  SpO2: 100%  Weight: 252 lb 12 oz (114.6 kg)    PHYSICAL EXAM: General:  Well appearing. No resp difficulty.  HEENT: normal Neck: supple. JVP 6-7. Carotids 2+ bilaterally; no bruits. No lymphadenopathy or thryomegaly appreciated. Cor: PMI normal. Regular rate & rhythm. No rubs, gallops or murmurs. No S3  Lungs: clear Abdomen: soft, nontender, nondistended. No hepatosplenomegaly. No bruits or masses. Good bowel sounds. Extremities: no cyanosis, clubbing, rash, edema. 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   Neuro: alert & orientedx3, cranial nerves grossly intact. Moves all 4 extremities w/o difficulty. Affect pleasant.  ASSESSMENT & PLAN: 1. Chronic systolic CHF:  2/17 admit with cardiogenic shock.  EF 15% by echo with mildly dilated and mildly dysfunctional RV. Was on milrinone but able to titrate off  initially. RHC on 2/23 showed good cardiac output off milrinone but still very volume overloaded. Milrinone restarted 2/25 for recurrent profound shock. Concern for viral myocarditis, had had viral-type symptoms for several weeks and had had antibiotic courses at home prior to admission. Coxsackie Antibody A + so suspect viral myocarditis (IgG rather than IgM, so not totally sure how recent exposure was). cMRI with EF 29%, poor contrast study so not able to comment on infiltrative disease. Failed milrinone wean x 2 in hospital but weaned off at home. Echo in 6/17 with EF up to 35-40% and normal diastolic function, normal RV.  NYHA II. Volume status stable.  - Continue digoxin, check level today.  - Continue torsemide 20 mg daily.  BMET today.  - Continue current spironolactone.  - Continue Entresto 97/103 bid.   - Increase Coreg to 12.5 mg bid and decrease ivabradine to  2.5 mg bid.    - Repeat echo in 12/17.  Echo in 6/17 showed EF just outside ICD range.  He would not be CRT candidate with narrow QRS.  2. Ischemic toes: Now with dry gangrene.  Suspect due to low flow/low cardiac output => think unlikely to be cardioembolic with symmetry. No LV thrombus noted on echo.  Seeing Dr Myra Gianotti.   3. RUE DVT: Confirmed on Ultrasound 2/27. On coumadin, may stop at the end of 8/17 and start ASA 81 daily.   4. Depression: Continue Celexa 20 mg daily.   Followup in 6 wks.   Marca Ancona 02/22/2016

## 2016-02-23 ENCOUNTER — Ambulatory Visit (INDEPENDENT_AMBULATORY_CARE_PROVIDER_SITE_OTHER): Payer: BLUE CROSS/BLUE SHIELD | Admitting: Interventional Cardiology

## 2016-02-23 DIAGNOSIS — Z7901 Long term (current) use of anticoagulants: Secondary | ICD-10-CM

## 2016-02-23 DIAGNOSIS — I82621 Acute embolism and thrombosis of deep veins of right upper extremity: Secondary | ICD-10-CM

## 2016-03-04 ENCOUNTER — Encounter: Payer: Self-pay | Admitting: Surgery

## 2016-03-07 ENCOUNTER — Ambulatory Visit (INDEPENDENT_AMBULATORY_CARE_PROVIDER_SITE_OTHER): Payer: BLUE CROSS/BLUE SHIELD | Admitting: Surgery

## 2016-03-07 ENCOUNTER — Ambulatory Visit (INDEPENDENT_AMBULATORY_CARE_PROVIDER_SITE_OTHER): Payer: BLUE CROSS/BLUE SHIELD | Admitting: Pharmacist

## 2016-03-07 ENCOUNTER — Encounter: Payer: Self-pay | Admitting: Surgery

## 2016-03-07 VITALS — BP 109/67 | HR 76 | Temp 97.7°F | Ht 74.0 in | Wt 255.0 lb

## 2016-03-07 DIAGNOSIS — I82621 Acute embolism and thrombosis of deep veins of right upper extremity: Secondary | ICD-10-CM

## 2016-03-07 DIAGNOSIS — Z7901 Long term (current) use of anticoagulants: Secondary | ICD-10-CM | POA: Diagnosis not present

## 2016-03-07 DIAGNOSIS — B3322 Viral myocarditis: Secondary | ICD-10-CM

## 2016-03-07 LAB — POCT INR: INR: 2

## 2016-03-07 NOTE — Progress Notes (Signed)
[facility] [place]  Patient name: James Bennett MRN: [id number] DOB: [birth year] Sex: male  REASON FOR VISIT: Follow up  HPI: James Bennett is a 20 y.o. male who developed a viral myocarditis.  During his treatment, his ejection fraction dropped to approximately 10-15 percent.  He developed ischemia of all 10 toes.  I have been following his toes to let them demarcate.  He has made amazing progress.  He also is on Coumadin for right arm DVT.  Past Medical History:  Diagnosis Date  . Bronchitis   . Deep vein thrombosis (DVT) of upper extremity (HCC) 09/22/2015   right  . Thrombocytopenia (HCC) 09/22/2015   Mild, transient    Family History  Problem Relation Age of Onset  . Lupus Mother     SOCIAL HISTORY: Social History  Substance Use Topics  . Smoking status: Never Smoker  . Smokeless tobacco: Not on file  . Alcohol use No    No Known Allergies  Current Outpatient Prescriptions  Medication Sig Dispense Refill  . acetaminophen (TYLENOL) 325 MG tablet Take 2 tablets (650 mg total) by mouth every 4 (four) hours as needed for headache or mild pain.    . carvedilol (COREG) 12.5 MG tablet Take 1 tablet (12.5 mg total) by mouth 2 (two) times daily with a meal. 60 tablet 3  . citalopram (CELEXA) 20 MG tablet Take 1 tablet (20 mg total) by mouth daily. 30 tablet 6  . digoxin (LANOXIN) 0.25 MG tablet Take 0.5 tablets (0.125 mg total) by mouth daily. 15 tablet 6  . famotidine (PEPCID) 20 MG tablet Take 1 tablet (20 mg total) by mouth 2 (two) times daily. 60 tablet 6  . hydrocortisone cream 1 % Apply topically every 2 (two) hours as needed for itching. 30 g 0  . ivabradine (CORLANOR) 5 MG TABS tablet Take 0.5 tablets (2.5 mg total) by mouth 2 (two) times daily with a meal. 30 tablet 3  . magnesium oxide (MAG-OX) 400 (241.3 Mg) MG tablet Take 1 tablet (400 mg total) by mouth 2 (two) times daily. 60 tablet 6  . potassium chloride SA  (K-DUR,KLOR-CON) 20 MEQ tablet Take 2 tablets (40 mEq total) by mouth daily. 60 tablet 6  . sacubitril-valsartan (ENTRESTO) 97-103 MG Take 1 tablet by mouth 2 (two) times daily. 60 tablet 6  . spironolactone (ALDACTONE) 25 MG tablet Take 1 tablet (25 mg total) by mouth daily. 30 tablet 6  . torsemide (DEMADEX) 20 MG tablet Take 1 tablet (20 mg total) by mouth daily. 30 tablet 6  . warfarin (COUMADIN) 10 MG tablet Take 1 tablet (10 mg total) by mouth daily. 30 tablet 6   No current facility-administered medications for this visit.     REVIEW OF SYSTEMS:  [X]  denotes positive finding, [ ]  denotes negative finding Cardiac  Comments:  Chest pain or chest pressure:    Shortness of breath upon exertion:    Short of breath when lying flat:    Irregular heart rhythm:        Vascular    Pain in calf, thigh, or hip brought on by ambulation:    Pain in feet at night that wakes you up from your sleep:     Blood clot in your veins:    Leg swelling:         Pulmonary    Oxygen at home:    Productive cough:     Wheezing:  Neurologic    Sudden weakness in arms or legs:     Sudden numbness in arms or legs:     Sudden onset of difficulty speaking or slurred speech:    Temporary loss of vision in one eye:     Problems with dizziness:         Gastrointestinal    Blood in stool:     Vomited blood:         Genitourinary    Burning when urinating:     Blood in urine:        Psychiatric    Major depression:         Hematologic    Bleeding problems:    Problems with blood clotting too easily:        Skin    Rashes or ulcers:        Constitutional    Fever or chills:      PHYSICAL EXAM: Vitals:   03/07/16 1400  BP: 109/67  Pulse: 76  Temp: 97.7 F (36.5 C)  TempSrc: Oral  SpO2: 97%  Weight: 255 lb (115.7 kg)  Height: 6\' 2"  (1.88 m)    GENERAL: The patient is a well-nourished male, in no acute distress. The vital signs are documented above. CARDIAC: There is a  regular rate and rhythm.  VASCULAR: Palpable pedal pulses PULMONARY: There is good air exchange bilaterally without wheezing or rales. ABDOMEN: Soft and non-tender with normal pitched bowel sounds.  MUSCULOSKELETAL: There are no major deformities or cyanosis. NEUROLOGIC: No focal weakness or paresthesias are detected. SKIN: The toes continue to demarcate and heel.  Several toes bilaterally do have areas of bone exposed.  Others have areas where the skin has tried to close over top of the bone. PSYCHIATRIC: The patient has a normal affect.  DATA:  I debrided the remaining eschar off of the right third toe.  There was excellent bleeding.  MEDICAL ISSUES: I have scheduled the patient for follow-up in 2 months.  He potentially will have to go to the operating room to remove the exposed bone, however he has done an excellent job of autoamputation and subsequent healing.    Durene CalWells Giancarlo Askren, MD [facility] Tel [phone] Pager [phone]

## 2016-03-21 ENCOUNTER — Other Ambulatory Visit (HOSPITAL_COMMUNITY): Payer: Self-pay | Admitting: *Deleted

## 2016-03-21 MED ORDER — SPIRONOLACTONE 25 MG PO TABS: 25.0000 mg | ORAL_TABLET | Freq: Every day | ORAL | 6 refills | Status: DC

## 2016-04-06 ENCOUNTER — Encounter (HOSPITAL_COMMUNITY): Payer: Self-pay

## 2016-04-06 ENCOUNTER — Ambulatory Visit (HOSPITAL_COMMUNITY)
Admission: RE | Admit: 2016-04-06 | Discharge: 2016-04-06 | Disposition: A | Discharge status: Home / Self Care | Payer: BLUE CROSS/BLUE SHIELD | Source: Ambulatory Visit | Attending: Cardiology | Admitting: Cardiology

## 2016-04-06 VITALS — BP 134/74 | HR 83 | Wt 255.2 lb

## 2016-04-06 DIAGNOSIS — F329 Major depressive disorder, single episode, unspecified: Secondary | ICD-10-CM | POA: Insufficient documentation

## 2016-04-06 DIAGNOSIS — I998 Other disorder of circulatory system: Secondary | ICD-10-CM | POA: Insufficient documentation

## 2016-04-06 DIAGNOSIS — I5022 Chronic systolic (congestive) heart failure: Secondary | ICD-10-CM | POA: Diagnosis not present

## 2016-04-06 DIAGNOSIS — Z86718 Personal history of other venous thrombosis and embolism: Secondary | ICD-10-CM | POA: Insufficient documentation

## 2016-04-06 DIAGNOSIS — I82621 Acute embolism and thrombosis of deep veins of right upper extremity: Secondary | ICD-10-CM

## 2016-04-06 DIAGNOSIS — Z7982 Long term (current) use of aspirin: Secondary | ICD-10-CM | POA: Diagnosis not present

## 2016-04-06 DIAGNOSIS — I429 Cardiomyopathy, unspecified: Secondary | ICD-10-CM | POA: Diagnosis not present

## 2016-04-06 LAB — BASIC METABOLIC PANEL
Anion gap: 11 (ref 5–15)
BUN: 12 mg/dL (ref 6–20)
CHLORIDE: 104 mmol/L (ref 101–111)
CO2: 22 mmol/L (ref 22–32)
Calcium: 10.3 mg/dL (ref 8.9–10.3)
Creatinine, Ser: 0.69 mg/dL (ref 0.61–1.24)
GFR calc Af Amer: >60 mL/min (ref >60)
GFR calc non Af Amer: >60 mL/min (ref >60)
Glucose, Bld: 102 mg/dL — ABNORMAL HIGH (ref 65–99)
POTASSIUM: 4.1 mmol/L (ref 3.5–5.1)
SODIUM: 137 mmol/L (ref 135–145)

## 2016-04-06 LAB — DIGOXIN LEVEL: Digoxin Level: 0.6 ng/mL — ABNORMAL LOW (ref 0.8–2.0)

## 2016-04-06 LAB — BRAIN NATRIURETIC PEPTIDE: B NATRIURETIC PEPTIDE 5: 23 pg/mL (ref 0.0–100.0)

## 2016-04-06 MED ORDER — CARVEDILOL 12.5 MG PO TABS: 18.7500 mg | ORAL_TABLET | Freq: Two times a day (BID) | ORAL | 3 refills | Status: DC

## 2016-04-06 MED ORDER — ASPIRIN EC 81 MG PO TBEC: 81.0000 mg | DELAYED_RELEASE_TABLET | Freq: Every day | ORAL | 3 refills | Status: DC

## 2016-04-06 NOTE — Patient Instructions (Signed)
Stop Corlanor  Increase Carvedilol to 18.75 mg (1 & 1/2 tabs) Twice daily   Start Aspirin 81 mg daily  Labs today  Your physician has requested that you have an echocardiogram. Echocardiography is a painless test that uses sound waves to create images of your heart. It provides your doctor with information about the size and shape of your heart and how well your heart's chambers and valves are working. This procedure takes approximately one hour. There are no restrictions for this procedure.  In December  Your physician recommends that you schedule a follow-up appointment in: December

## 2016-04-07 NOTE — Progress Notes (Signed)
Patient ID: James Bennett, male   DOB: [birth year], 20 y.o.   MRN: [id number]  Primary Cardiologist: Dr Shirlee Latch  Vascular: Dr Myra Gianotti  HPI: James Bennett is a 20 year old with a no medical history until hospital admit February 20th, 2017  for acute dyspnea . He was admitted with acute respiratory failure complicated by cardiogenic shock, ischemic toes and RUE DVT. Due to low output he had ischemic toes. Vascular evaluated. He did not require surgical interventio. EF 15% by echo with mildly dilated and mildly dysfunctional RV. Low output HF with marked volume overload initially. Was on milrinone but able to titrate off initially. RHC on 2/23 showed good cardiac output off milrinone but still very volume overloaded. Milrinone restarted 2/25 for recurrent profound shock. Concern for viral myocarditis, has had viral-type symptoms for several weeks and has had antibiotic courses at home prior to admission. Coxsackie Antibody A + so suspect viral myocarditis (IgG rather than IgM, so not totally sure how recent exposure was). cMRI with EF 29%, poor contrast study so not able to comment on infiltrative disease. Failed milrinone wean x 2, went home on IV milrinone.  However, we were able to wean off milrinone as an outpatient.   He continues to do well.  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  No exertional dyspnea.  No fatigue. No orthopnea/PND.  No lightheadedness/syncope. Has dry gangrene of toe tips, followed by Dr Myra Gianotti.  Still no definite plans for OR.    Echo in 6/17 showed EF 35-40% with normal diastolic function and normal RV.   Labs (3/17): HCT 30.6, K 3.8, creatinine 0.74, co-ox 56%.  Labs (4/17): K 4.5, creatinine 0.71 => 0.73, BNP 174, digoxin 0.9 Labs (5/17): K 4.6, creatinine 0.79, digoxin 1.0, HCT 38 Labs (7/17): K 3.9, creatinine 0.65, digoxin 0.5  PMH: 1. Nonischemic cardiomyopathy: 2/17 admitted with cardiogenic shock.  Suspected acute myocarditis, coxsackievirus A  positive.  He was started on milrinone, titrated off in 3/17.  - Echo (2/17) with EF 15%, diffuse hypokinesis, mildly decreased RV systolic function.  - Cardiac MRI (3/17) with EF 29%, moderately dilated LV, mildly decreased RV systolic function, unable to assess for late gadolinium enhancement (poor quality contrast). - RHC (2/17) with mean RA 19, PA 51/32 mean 45, PCWP mean 40, CI 2.24 Fick/2.56 thermo.  - Blood type A+. - Echo (6/17): EF 35-40%, normal diastolic function, normal RV size and systolic function, PASP 42 mmHg, GLS-16.4%.  2. Ischemic toes: Suspect due to low output/cardiogenic shock.  Now with dry gangrene.  3. RUE DVT: Now on coumadin.  4. NSVT: Lifevest 5. Depression  ROS: All systems negative except as listed in HPI, PMH and Problem List.  SH:  Social History   Social History  . Marital status: Single    Spouse name: N/A  . Number of children: N/A  . Years of education: N/A   Occupational History  . Not on file.   Social History Main Topics  . Smoking status: Never Smoker  . Smokeless tobacco: Not on file  . Alcohol use No  . Drug use: No  . Sexual activity: Not on file   Other Topics Concern  . 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 Bird exposure through his father's workplace of a small parrot. No mold exposure. No recent travel.    FH:  Family History  Problem Relation Age of  Onset  . Lupus Mother     Current Outpatient Prescriptions  Medication Sig Dispense Refill  . acetaminophen (TYLENOL) 325 MG tablet Take 2 tablets (650 mg total) by mouth every 4 (four) hours as needed for headache or mild pain.    . carvedilol (COREG) 12.5 MG tablet Take 1.5 tablets (18.75 mg total) by mouth 2 (two) times daily with a meal. 90 tablet 3  . citalopram (CELEXA) 20 MG tablet Take 1 tablet (20 mg total) by mouth daily. 30 tablet 6  . digoxin (LANOXIN) 0.25 MG tablet Take 0.5  tablets (0.125 mg total) by mouth daily. 15 tablet 6  . famotidine (PEPCID) 20 MG tablet Take 1 tablet (20 mg total) by mouth 2 (two) times daily. 60 tablet 6  . hydrocortisone cream 1 % Apply topically every 2 (two) hours as needed for itching. 30 g 0  . magnesium oxide (MAG-OX) 400 (241.3 Mg) MG tablet Take 1 tablet (400 mg total) by mouth 2 (two) times daily. 60 tablet 6  . potassium chloride SA (K-DUR,KLOR-CON) 20 MEQ tablet Take 2 tablets (40 mEq total) by mouth daily. 60 tablet 6  . sacubitril-valsartan (ENTRESTO) 97-103 MG Take 1 tablet by mouth 2 (two) times daily. 60 tablet 6  . spironolactone (ALDACTONE) 25 MG tablet Take 1 tablet (25 mg total) by mouth daily. 30 tablet 6  . torsemide (DEMADEX) 20 MG tablet Take 1 tablet (20 mg total) by mouth daily. 30 tablet 6  . aspirin EC 81 MG tablet Take 1 tablet (81 mg total) by mouth daily. 90 tablet 3   No current facility-administered medications for this encounter.     Vitals:   04/06/16 1147  BP: 134/74  Pulse: 83  SpO2: 100%  Weight: 255 lb 4 oz (115.8 kg)    PHYSICAL EXAM: General:  Well appearing. No resp difficulty.  HEENT: normal Neck: supple. JVP 6-7. Carotids 2+ bilaterally; no bruits. No lymphadenopathy or thryomegaly appreciated. Cor: PMI normal. Regular rate & rhythm. No rubs, gallops or murmurs. No S3  Lungs: clear Abdomen: soft, nontender, nondistended. No hepatosplenomegaly. No bruits or masses. Good bowel sounds. Extremities: no cyanosis, clubbing, rash, edema. 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   Neuro: alert & orientedx3, cranial nerves grossly intact. Moves all 4 extremities w/o difficulty. Affect pleasant.  ASSESSMENT & PLAN: 1. Chronic systolic CHF:  2/17 admit with cardiogenic shock.  EF 15% by echo with mildly dilated and mildly dysfunctional RV. Was on milrinone but able to titrate off initially. RHC on 2/23 showed good cardiac output off milrinone but still very volume overloaded. Milrinone  restarted 2/25 for recurrent profound shock. Concern for viral myocarditis, had had viral-type symptoms for several weeks and had had antibiotic courses at home prior to admission. Coxsackie Antibody A + so suspect viral myocarditis (IgG rather than IgM, so not totally sure how recent exposure was). cMRI with EF 29%, poor contrast study so not able to comment on infiltrative disease. Failed milrinone wean x 2 in hospital but weaned off at home. Echo in 6/17 with EF up to 35-40% and normal diastolic function, normal RV.  NYHA II. Volume status stable.  - Continue digoxin, check level today.  - Continue torsemide 20 mg daily.  BMET today.  - Continue current spironolactone.  - Continue Entresto 97/103 bid.   - Increase Coreg to 18.75 mg bid and stop ivabradine.     - Repeat echo in 12/17.  Echo in 6/17 showed EF just outside ICD  range.  He would not be CRT candidate with narrow QRS.   2. Ischemic toes: Now with dry gangrene.  Suspect due to low flow/low cardiac output => think unlikely to be cardioembolic with symmetry. No LV thrombus noted on echo.  Seeing Dr Myra GianottiBrabham.   3. RUE DVT: Confirmed on Ultrasound 2/27. Off coumadin in 8/17, needs to start ASA 81 daily.   4. Depression: Continue Celexa 20 mg daily.   Followup in 12/17 with echo.    Marca AnconaDalton Keileigh Vahey 04/07/2016

## 2016-04-25 ENCOUNTER — Other Ambulatory Visit (HOSPITAL_COMMUNITY): Payer: Self-pay | Admitting: *Deleted

## 2016-04-25 MED ORDER — FAMOTIDINE 20 MG PO TABS: 20.0000 mg | ORAL_TABLET | Freq: Two times a day (BID) | ORAL | 6 refills | Status: DC

## 2016-05-09 ENCOUNTER — Ambulatory Visit: Payer: BLUE CROSS/BLUE SHIELD | Admitting: Surgery

## 2016-05-09 ENCOUNTER — Other Ambulatory Visit (HOSPITAL_COMMUNITY): Payer: Self-pay | Admitting: *Deleted

## 2016-05-09 MED ORDER — TORSEMIDE 20 MG PO TABS: 20.0000 mg | ORAL_TABLET | Freq: Every day | ORAL | 6 refills | Status: DC

## 2016-05-17 ENCOUNTER — Other Ambulatory Visit (HOSPITAL_COMMUNITY): Payer: Self-pay | Admitting: Student

## 2016-05-23 ENCOUNTER — Other Ambulatory Visit (HOSPITAL_COMMUNITY): Payer: Self-pay | Admitting: Student

## 2016-06-08 ENCOUNTER — Encounter: Payer: Self-pay | Admitting: Surgery

## 2016-06-13 ENCOUNTER — Ambulatory Visit: Payer: BLUE CROSS/BLUE SHIELD | Admitting: Surgery

## 2016-06-14 ENCOUNTER — Other Ambulatory Visit (HOSPITAL_COMMUNITY): Payer: Self-pay | Admitting: *Deleted

## 2016-06-14 MED ORDER — TORSEMIDE 20 MG PO TABS: 20.0000 mg | ORAL_TABLET | Freq: Every day | ORAL | 6 refills | Status: DC

## 2016-06-20 ENCOUNTER — Encounter: Payer: Self-pay | Admitting: Surgery

## 2016-06-20 ENCOUNTER — Other Ambulatory Visit (HOSPITAL_COMMUNITY): Payer: Self-pay | Admitting: *Deleted

## 2016-06-20 ENCOUNTER — Ambulatory Visit (INDEPENDENT_AMBULATORY_CARE_PROVIDER_SITE_OTHER): Payer: BLUE CROSS/BLUE SHIELD | Admitting: Surgery

## 2016-06-20 VITALS — BP 122/76 | HR 95 | Temp 99.7°F | Resp 20 | Ht 74.0 in | Wt 265.1 lb

## 2016-06-20 DIAGNOSIS — B3322 Viral myocarditis: Secondary | ICD-10-CM | POA: Diagnosis not present

## 2016-06-20 MED ORDER — CITALOPRAM HYDROBROMIDE 20 MG PO TABS: 20.0000 mg | ORAL_TABLET | Freq: Every day | ORAL | 6 refills | Status: DC

## 2016-06-20 NOTE — Progress Notes (Signed)
[facility] [place]  Patient name: James Bennett MRN: [id number] DOB: 07/02/1996 Sex: male  REASON FOR VISIT: follow up  HPI: James Bennett is a 20 y.o. male who developed a viral myocarditis.  During his treatment, his ejection fraction dropped to approximately 10-15 percent.  He developed ischemia of all 10 toes.  I have been following his toes to let them demarcate.  He has made amazing progress.  He also is on Coumadin for right arm DVT.  His wounds have healed  Past Medical History:  Diagnosis Date  . Bronchitis   . Deep vein thrombosis (DVT) of upper extremity (HCC) 09/22/2015   right  . Thrombocytopenia (HCC) 09/22/2015   Mild, transient    Family History  Problem Relation Age of Onset  . Lupus Mother     SOCIAL HISTORY: Social History  Substance Use Topics  . Smoking status: Never Smoker  . Smokeless tobacco: Never Used  . Alcohol use No    No Known Allergies  Current Outpatient Prescriptions  Medication Sig Dispense Refill  . acetaminophen (TYLENOL) 325 MG tablet Take 2 tablets (650 mg total) by mouth every 4 (four) hours as needed for headache or mild pain.    [name]. aspirin EC 81 MG tablet Take 1 tablet (81 mg total) by mouth daily. 90 tablet 3  . carvedilol (COREG) 12.5 MG tablet Take 1.5 tablets (18.75 mg total) by mouth 2 (two) times daily with a meal. 90 tablet 3  . citalopram (CELEXA) 20 MG tablet Take 1 tablet (20 mg total) by mouth daily. 30 tablet 6  . digoxin (DIGOX) 0.25 MG tablet Take 0.5 tablets (0.125 mg total) by mouth daily 15 tablet 3  . digoxin (LANOXIN) 0.25 MG tablet Take 0.5 tablets (0.125 mg total) by mouth daily. 15 tablet 6  . famotidine (PEPCID) 20 MG tablet Take 1 tablet (20 mg total) by mouth 2 (two) times daily. 60 tablet 6  . hydrocortisone cream 1 % Apply topically every 2 (two) hours as needed for itching. 30 g 0  . Magnesium Oxide 400 (240 Mg) MG TABS TAKE 1 TABLET BY MOUTH 2 TIMES A  DAY 60 tablet 3  . potassium chloride SA (K-DUR,KLOR-CON) 20 MEQ tablet Take 2 tablets (40 mEq total) by mouth daily. 60 tablet 6  . sacubitril-valsartan (ENTRESTO) 97-103 MG Take 1 tablet by mouth 2 (two) times daily. 60 tablet 6  . spironolactone (ALDACTONE) 25 MG tablet Take 1 tablet (25 mg total) by mouth daily. 30 tablet 6  . torsemide (DEMADEX) 20 MG tablet Take 1 tablet (20 mg total) by mouth daily. 30 tablet 6   No current facility-administered medications for this visit.     REVIEW OF SYSTEMS:  [X]  denotes positive finding, [ ]  denotes negative finding Cardiac  Comments:  Chest pain or chest pressure:    Shortness of breath upon exertion:    Short of breath when lying flat:    Irregular heart rhythm:        Vascular    Pain in calf, thigh, or hip brought on by ambulation:    Pain in feet at night that wakes you up from your sleep:     Blood clot in your veins:    Leg swelling:         Pulmonary    Oxygen at home:    Productive cough:     Wheezing:         Neurologic    Sudden weakness in  arms or legs:     Sudden numbness in arms or legs:     Sudden onset of difficulty speaking or slurred speech:    Temporary loss of vision in one eye:     Problems with dizziness:         Gastrointestinal    Blood in stool:     Vomited blood:         Genitourinary    Burning when urinating:     Blood in urine:        Psychiatric    Major depression:         Hematologic    Bleeding problems:    Problems with blood clotting too easily:        Skin    Rashes or ulcers:        Constitutional    Fever or chills:      PHYSICAL EXAM: Vitals:   06/20/16 1332  BP: 122/76  Pulse: 95  Resp: 20  Temp: 99.7 F (37.6 C)  TempSrc: Oral  SpO2: 90%  Weight: 265 lb 1.6 oz (120.2 kg)  Height: 6\' 2"  (1.88 m)    GENERAL: The patient is a well-nourished male, in no acute distress. The vital signs are documented above. CARDIAC: There is a regular rate and rhythm.  VASCULAR:  palpable pedal pulses PULMONARY: There is good air exchange bilaterally without wheezing or rales. MUSCULOSKELETAL: There are no major deformities or cyanosis. NEUROLOGIC: No focal weakness or paresthesias are detected. SKIN: toe wounds x 10 have healed PSYCHIATRIC: The patient has a normal affect.  DATA:  none  MEDICAL ISSUES: All toe wounds have healed.  No intervention is needed.  We'll follow up on an as-needed basis  Right upper extremity DVT: Would consider discontinuing anticoagulation    Durene Cal, MD [facility] [phone] Pager [phone]

## 2016-07-08 ENCOUNTER — Ambulatory Visit (HOSPITAL_BASED_OUTPATIENT_CLINIC_OR_DEPARTMENT_OTHER)
Admission: RE | Admit: 2016-07-08 | Discharge: 2016-07-08 | Disposition: A | Discharge status: Home / Self Care | Payer: BLUE CROSS/BLUE SHIELD | Source: Ambulatory Visit | Attending: Cardiology | Admitting: Cardiology

## 2016-07-08 ENCOUNTER — Ambulatory Visit (HOSPITAL_COMMUNITY)
Admission: RE | Admit: 2016-07-08 | Discharge: 2016-07-08 | Disposition: A | Discharge status: Home / Self Care | Payer: BLUE CROSS/BLUE SHIELD | Source: Ambulatory Visit | Attending: Cardiology | Admitting: Cardiology

## 2016-07-08 VITALS — BP 124/70 | HR 85 | Wt 267.2 lb

## 2016-07-08 DIAGNOSIS — I429 Cardiomyopathy, unspecified: Secondary | ICD-10-CM | POA: Insufficient documentation

## 2016-07-08 DIAGNOSIS — I5022 Chronic systolic (congestive) heart failure: Secondary | ICD-10-CM | POA: Insufficient documentation

## 2016-07-08 DIAGNOSIS — I509 Heart failure, unspecified: Secondary | ICD-10-CM | POA: Diagnosis present

## 2016-07-08 LAB — BASIC METABOLIC PANEL
ANION GAP: 11 (ref 5–15)
BUN: 10 mg/dL (ref 6–20)
CO2: 23 mmol/L (ref 22–32)
Calcium: 9.9 mg/dL (ref 8.9–10.3)
Chloride: 103 mmol/L (ref 101–111)
Creatinine, Ser: 0.65 mg/dL (ref 0.61–1.24)
GFR calc Af Amer: >60 mL/min (ref >60)
GLUCOSE: 93 mg/dL (ref 65–99)
POTASSIUM: 3.8 mmol/L (ref 3.5–5.1)
Sodium: 137 mmol/L (ref 135–145)

## 2016-07-08 MED ORDER — CARVEDILOL 25 MG PO TABS: 25.0000 mg | ORAL_TABLET | Freq: Two times a day (BID) | ORAL | 3 refills | Status: DC

## 2016-07-08 NOTE — Progress Notes (Signed)
Echocardiogram 2D Echocardiogram has been performed.  James Bennett 07/08/2016, 10:36 AM

## 2016-07-08 NOTE — Patient Instructions (Signed)
Labs today (will call for abnormal results, otherwise no news is good news)  STOP taking Digoxin  Increase Coreg to 25 mg (1 Tab) Two times daily  Echo in 6 Months  Follow up in 3 Months

## 2016-07-09 NOTE — Progress Notes (Signed)
Patient ID: James Bennett, male   DOB: [birth year], 20 y.o.   MRN: [id number]  Primary Cardiologist: Dr Shirlee Latch  Vascular: Dr Myra Gianotti  HPI: [name] is a 20 year old with a no medical history until hospital admit February 20th, 2017  for acute dyspnea . He was admitted with acute respiratory failure complicated by cardiogenic shock, ischemic toes and RUE DVT. Due to low output he had ischemic toes. Vascular evaluated. He did not require surgical interventio. EF 15% by echo with mildly dilated and mildly dysfunctional RV. Low output HF with marked volume overload initially. Was on milrinone but able to titrate off initially. RHC on 2/23 showed good cardiac output off milrinone but still very volume overloaded. Milrinone restarted 2/25 for recurrent profound shock. Concern for viral myocarditis, has had viral-type symptoms for several weeks and has had antibiotic courses at home prior to admission. Coxsackie Antibody A + so suspect viral myocarditis (IgG rather than IgM, so not totally sure how recent exposure was). cMRI with EF 29%, poor contrast study so not able to comment on infiltrative disease. Failed milrinone wean x 2, went home on IV milrinone.  However, we were able to wean off milrinone as an outpatient.   He continues to do well.  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  No exertional dyspnea.  No fatigue. No orthopnea/PND.  No lightheadedness/syncope. Toes have healed, will not have to have surgery.  Weight is up, but he has been eating out a lot and has a very good appetite.   Echo in 6/17 showed EF 35-40% with normal diastolic function and normal RV. Repeat today was reviewed, EF up to 45% with diffuse hypokinesis.   Labs (3/17): HCT 30.6, K 3.8, creatinine 0.74, co-ox 56%.  Labs (4/17): K 4.5, creatinine 0.71 => 0.73, BNP 174, digoxin 0.9 Labs (5/17): K 4.6, creatinine 0.79, digoxin 1.0, HCT 38 Labs (7/17): K 3.9, creatinine 0.65, digoxin 0.5 Labs (9/17): digoxin  0.6, K 4.1, creatinine 0.69, BNP 23  PMH: 1. Nonischemic cardiomyopathy: 2/17 admitted with cardiogenic shock.  Suspected acute myocarditis, coxsackievirus A positive.  He was started on milrinone, titrated off in 3/17.  - Echo (2/17) with EF 15%, diffuse hypokinesis, mildly decreased RV systolic function.  - Cardiac MRI (3/17) with EF 29%, moderately dilated LV, mildly decreased RV systolic function, unable to assess for late gadolinium enhancement (poor quality contrast). - RHC (2/17) with mean RA 19, PA 51/32 mean 45, PCWP mean 40, CI 2.24 Fick/2.56 thermo.  - Blood type A+. - Echo (6/17): EF 35-40%, normal diastolic function, normal RV size and systolic function, PASP 42 mmHg, GLS-16.4%. - Echo (12/17): EF 45%, diffuse hypokinesis.   2. Ischemic toes: Suspect due to low output/cardiogenic shock.  3. RUE DVT: Now on coumadin.  4. NSVT: Lifevest 5. Depression  ROS: All systems negative except as listed in HPI, PMH and Problem List.  SH:  Social History   Social History  . Marital status: Single    Spouse name: N/A  . Number of children: N/A  . Years of education: N/A   Occupational History  . Not on file.   Social History Main Topics  . Smoking status: Never Smoker  . Smokeless tobacco: Never Used  . Alcohol use No  . Drug use: No  . Sexual activity: Not on file   Other Topics Concern  . 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  Currently has 5 dogs. Bird exposure through his father's workplace of a small parrot. No mold exposure. No recent travel.    FH:  Family History  Problem Relation Age of Onset  . Lupus Mother     Current Outpatient Prescriptions  Medication Sig Dispense Refill  . acetaminophen (TYLENOL) 325 MG tablet Take 2 tablets (650 mg total) by mouth every 4 (four) hours as needed for headache or mild pain.    [name] aspirin EC 81 MG tablet Take 1 tablet (81 mg total) by mouth daily. 90 tablet  3  . carvedilol (COREG) 25 MG tablet Take 1 tablet (25 mg total) by mouth 2 (two) times daily with a meal. 60 tablet 3  . citalopram (CELEXA) 20 MG tablet Take 1 tablet (20 mg total) by mouth daily. 30 tablet 6  . famotidine (PEPCID) 20 MG tablet Take 1 tablet (20 mg total) by mouth 2 (two) times daily. 60 tablet 6  . hydrocortisone cream 1 % Apply topically every 2 (two) hours as needed for itching. 30 g 0  . Magnesium Oxide 400 (240 Mg) MG TABS TAKE 1 TABLET BY MOUTH 2 TIMES A DAY 60 tablet 3  . potassium chloride SA (K-DUR,KLOR-CON) 20 MEQ tablet Take 2 tablets (40 mEq total) by mouth daily. 60 tablet 6  . sacubitril-valsartan (ENTRESTO) 97-103 MG Take 1 tablet by mouth 2 (two) times daily. 60 tablet 6  . spironolactone (ALDACTONE) 25 MG tablet Take 1 tablet (25 mg total) by mouth daily. 30 tablet 6  . torsemide (DEMADEX) 20 MG tablet Take 1 tablet (20 mg total) by mouth daily. 30 tablet 6   No current facility-administered medications for this encounter.     Vitals:   07/08/16 1055  BP: 124/70  Pulse: 85  SpO2: 98%  Weight: 267 lb 4 oz (121.2 kg)    PHYSICAL EXAM: General:  Well appearing. No resp difficulty.  HEENT: normal Neck: supple. JVP 6-7. Carotids 2+ bilaterally; no bruits. No lymphadenopathy or thryomegaly appreciated. Cor: PMI normal. Regular rate & rhythm. No rubs, gallops or murmurs. No S3  Lungs: clear Abdomen: soft, nontender, nondistended. No hepatosplenomegaly. No bruits or masses. Good bowel sounds. Extremities: no cyanosis, clubbing, rash, edema. 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   Neuro: alert & orientedx3, cranial nerves grossly intact. Moves all 4 extremities w/o difficulty. Affect pleasant.  ASSESSMENT & PLAN: 1. Chronic systolic CHF:  2/17 admit with cardiogenic shock.  EF 15% by echo with mildly dilated and mildly dysfunctional RV. Was on milrinone but able to titrate off initially. RHC on 2/23 showed good cardiac output off milrinone but  still very volume overloaded. Milrinone restarted 2/25 for recurrent profound shock. Concern for viral myocarditis, had had viral-type symptoms for several weeks and had had antibiotic courses at home prior to admission. Coxsackie Antibody A + so suspect viral myocarditis (IgG rather than IgM, so not totally sure how recent exposure was). cMRI with EF 29%, poor contrast study so not able to comment on infiltrative disease. Failed milrinone wean x 2 in hospital but weaned off at home. Echo today with EF up to 45%, diffuse hypokinesis.  NYHA II. Volume status stable.  - Can stop digoxin today with rise in EF.  Will increase Coreg up to 25 mg bid.  - Continue torsemide 20 mg daily.  BMET today.  - Continue current spironolactone.  - Continue Entresto 97/103 bid.    - EF out of ICD range.  Would repeat echo in  6/18.  2. Ischemic toes: Improved, will not need surgery.  3. RUE DVT: Confirmed on Ultrasound 2/27. Off coumadin in 8/17, now on ASA 81 daily.   4. Depression: Continue Celexa 20 mg daily.   Followup in 3 months.     Marca AnconaDalton McLean 07/09/2016

## 2016-07-19 ENCOUNTER — Other Ambulatory Visit (HOSPITAL_COMMUNITY): Payer: Self-pay | Admitting: *Deleted

## 2016-07-19 MED ORDER — SACUBITRIL-VALSARTAN 97-103 MG PO TABS: 1.0000 | ORAL_TABLET | Freq: Two times a day (BID) | ORAL | 6 refills | Status: DC

## 2016-08-13 ENCOUNTER — Other Ambulatory Visit (HOSPITAL_COMMUNITY): Payer: Self-pay | Admitting: Student

## 2016-10-03 ENCOUNTER — Other Ambulatory Visit (HOSPITAL_COMMUNITY): Payer: Self-pay | Admitting: Student

## 2016-10-10 ENCOUNTER — Other Ambulatory Visit (HOSPITAL_COMMUNITY): Payer: Self-pay | Admitting: Student

## 2016-10-10 ENCOUNTER — Other Ambulatory Visit (HOSPITAL_COMMUNITY): Payer: Self-pay | Admitting: Cardiology

## 2016-10-10 DIAGNOSIS — I5022 Chronic systolic (congestive) heart failure: Secondary | ICD-10-CM

## 2016-10-19 ENCOUNTER — Ambulatory Visit (HOSPITAL_COMMUNITY)
Admission: RE | Admit: 2016-10-19 | Discharge: 2016-10-19 | Disposition: A | Discharge status: Home / Self Care | Payer: BLUE CROSS/BLUE SHIELD | Source: Ambulatory Visit | Attending: Cardiology | Admitting: Cardiology

## 2016-10-19 ENCOUNTER — Encounter (HOSPITAL_COMMUNITY): Payer: Self-pay

## 2016-10-19 VITALS — BP 139/75 | HR 88 | Wt 288.5 lb

## 2016-10-19 DIAGNOSIS — Z832 Family history of diseases of the blood and blood-forming organs and certain disorders involving the immune mechanism: Secondary | ICD-10-CM | POA: Diagnosis not present

## 2016-10-19 DIAGNOSIS — Z86718 Personal history of other venous thrombosis and embolism: Secondary | ICD-10-CM | POA: Diagnosis not present

## 2016-10-19 DIAGNOSIS — I5022 Chronic systolic (congestive) heart failure: Secondary | ICD-10-CM | POA: Diagnosis not present

## 2016-10-19 DIAGNOSIS — F329 Major depressive disorder, single episode, unspecified: Secondary | ICD-10-CM | POA: Diagnosis not present

## 2016-10-19 DIAGNOSIS — E669 Obesity, unspecified: Secondary | ICD-10-CM | POA: Insufficient documentation

## 2016-10-19 DIAGNOSIS — Z7901 Long term (current) use of anticoagulants: Secondary | ICD-10-CM | POA: Diagnosis not present

## 2016-10-19 DIAGNOSIS — I429 Cardiomyopathy, unspecified: Secondary | ICD-10-CM | POA: Diagnosis present

## 2016-10-19 LAB — BASIC METABOLIC PANEL
ANION GAP: 11 (ref 5–15)
BUN: 11 mg/dL (ref 6–20)
CALCIUM: 9.8 mg/dL (ref 8.9–10.3)
CO2: 25 mmol/L (ref 22–32)
Chloride: 103 mmol/L (ref 101–111)
Creatinine, Ser: 0.68 mg/dL (ref 0.61–1.24)
GFR calc Af Amer: >60 mL/min (ref >60)
Glucose, Bld: 97 mg/dL (ref 65–99)
POTASSIUM: 4 mmol/L (ref 3.5–5.1)
SODIUM: 139 mmol/L (ref 135–145)

## 2016-10-19 LAB — BRAIN NATRIURETIC PEPTIDE: B NATRIURETIC PEPTIDE 5: 20.5 pg/mL (ref 0.0–100.0)

## 2016-10-19 NOTE — Patient Instructions (Signed)
Labs today  Work on [employer] loss  Your physician recommends that you schedule a follow-up appointment in: June with echocardiogram

## 2016-10-20 NOTE — Progress Notes (Signed)
Patient ID: James Bennett, male   DOB: 01/02/1996, 21 y.o.   MRN: [id number]  Cardiology: Dr Shirlee Latch   HPI: James Bennett is a 21 year old with a no medical history until hospital admit February 20th, 2017  for acute dyspnea . He was admitted with acute respiratory failure complicated by cardiogenic shock, ischemic toes and RUE DVT. Due to low output he had ischemic toes. Vascular evaluated. He did not require surgical interventio. EF 15% by echo with mildly dilated and mildly dysfunctional RV. Low output HF with marked volume overload initially. Was on milrinone but able to titrate off initially. RHC on 2/23 showed good cardiac output off milrinone but still very volume overloaded. Milrinone restarted 2/25 for recurrent profound shock. Concern for viral myocarditis, has had viral-type symptoms for several weeks and has had antibiotic courses at home prior to admission. Coxsackie Antibody A + so suspect viral myocarditis (IgG rather than IgM, so not totally sure how recent exposure was). cMRI with EF 29%, poor contrast study so not able to comment on infiltrative disease. Failed milrinone wean x 2, went home on IV milrinone.  However, we were able to wean off milrinone as an outpatient.   He continues to do well.  He is able to do what he wants now without limitation.  He is going out to eat a lot with his friends, lots of ice cream.  Unfortunately, his weight is up 21 lbs since last appointment.  No edema.  No exertional dyspnea.  No fatigue. No orthopnea/PND.  No lightheadedness/syncope. Toes have healed, will not have to have surgery.     Echo in 6/17 showed EF 35-40% with normal diastolic function and normal RV. Repeat echo in 12/17 showed EF up to 45% with diffuse hypokinesis.   Labs (3/17): HCT 30.6, K 3.8, creatinine 0.74, co-ox 56%.  Labs (4/17): K 4.5, creatinine 0.71 => 0.73, BNP 174, digoxin 0.9 Labs (5/17): K 4.6, creatinine 0.79, digoxin 1.0, HCT 38 Labs (7/17): K 3.9, creatinine 0.65, digoxin  0.5 Labs (9/17): digoxin 0.6, K 4.1, creatinine 0.69, BNP 23 Labs (12/17): K 3.8, creatinine 0.65  PMH: 1. Nonischemic cardiomyopathy: 2/17 admitted with cardiogenic shock.  Suspected acute myocarditis, coxsackievirus A positive.  He was started on milrinone, titrated off in 3/17.  - Echo (2/17) with EF 15%, diffuse hypokinesis, mildly decreased RV systolic function.  - Cardiac MRI (3/17) with EF 29%, moderately dilated LV, mildly decreased RV systolic function, unable to assess for late gadolinium enhancement (poor quality contrast). - RHC (2/17) with mean RA 19, PA 51/32 mean 45, PCWP mean 40, CI 2.24 Fick/2.56 thermo.  - Blood type A+. - Echo (6/17): EF 35-40%, normal diastolic function, normal RV size and systolic function, PASP 42 mmHg, GLS-16.4%. - Echo (12/17): EF 45%, diffuse hypokinesis.   2. Ischemic toes: Suspect due to low output/cardiogenic shock.  3. RUE DVT: Now on coumadin.  4. NSVT: Lifevest 5. Depression  ROS: All systems negative except as listed in HPI, PMH and Problem List.  SH:  Social History   Social History  . Marital status: Single    Spouse name: N/A  . Number of children: N/A  . Years of education: N/A   Occupational History  . Not on file.   Social History Main Topics  . Smoking status: Never Smoker  . Smokeless tobacco: Never Used  . Alcohol use No  . Drug use: No  . Sexual activity: Not on file   Other Topics Concern  . 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 Bird exposure through his father's workplace of a small parrot. No mold exposure. No recent travel.    FH:  Family History  Problem Relation Age of Onset  . Lupus Mother     Current Outpatient Prescriptions  Medication Sig Dispense Refill  . acetaminophen (TYLENOL) 325 MG tablet Take 2 tablets (650 mg total) by mouth every 4 (four) hours as needed for headache or mild pain.    [name] aspirin EC 81  MG tablet Take 1 tablet (81 mg total) by mouth daily. 90 tablet 3  . carvedilol (COREG) 25 MG tablet TAKE 1 TABLET BY MOUTH 2 TIMES A DAY WITH A MEAL 60 tablet 0  . citalopram (CELEXA) 20 MG tablet Take 1 tablet (20 mg total) by mouth daily. 30 tablet 6  . famotidine (PEPCID) 20 MG tablet Take 1 tablet (20 mg total) by mouth 2 (two) times daily. 60 tablet 6  . hydrocortisone cream 1 % Apply topically every 2 (two) hours as needed for itching. 30 g 0  . Magnesium Oxide 400 (240 Mg) MG TABS TAKE 1 TABLET BY MOUTH 2 TIMES A DAY 60 tablet 0  . potassium chloride SA (K-DUR,KLOR-CON) 20 MEQ tablet TAKE 2 TABLETS BY MOUTH DAILY 60 tablet 3  . sacubitril-valsartan (ENTRESTO) 97-103 MG Take 1 tablet by mouth 2 (two) times daily. 60 tablet 6  . spironolactone (ALDACTONE) 25 MG tablet TAKE 1 TABLET BY MOUTH EVERY DAY 30 tablet 6  . torsemide (DEMADEX) 20 MG tablet Take 1 tablet (20 mg total) by mouth daily. 30 tablet 6   No current facility-administered medications for this encounter.     Vitals:   10/19/16 1118  BP: 139/75  Pulse: 88  SpO2: 100%  Weight: 288 lb 8 oz (130.9 kg)    PHYSICAL EXAM: General:  NAD  HEENT: normal, anicteric Neck: supple. JVP 7. Carotids 2+ bilaterally; no bruits. No lymphadenopathy or thryomegaly appreciated. Cor: PMI normal. Regular rate & rhythm. No rubs, gallops or murmurs. No S3  Lungs: clear Abdomen: soft, nontender, nondistended. No hepatosplenomegaly. No bruits or masses. Good bowel sounds. Extremities: no cyanosis, clubbing, rash.  No edema.  Neuro: alert & orientedx3, cranial nerves grossly intact. Moves all 4 extremities w/o difficulty. Affect pleasant.  ASSESSMENT & PLAN: 1. Chronic systolic CHF:  2/17 admit with cardiogenic shock.  EF 15% by echo with mildly dilated and mildly dysfunctional RV. Was on milrinone but able to titrate off initially. RHC on 2/23 showed good cardiac output off milrinone but still very volume overloaded. Milrinone restarted  2/25 for recurrent profound shock. Concern for viral myocarditis, had had viral-type symptoms for several weeks and had had antibiotic courses at home prior to admission. Coxsackie Antibody A + so suspect viral myocarditis (IgG rather than IgM, so not totally sure how recent exposure was). cMRI with EF 29%, poor contrast study so not able to comment on infiltrative disease. Failed milrinone wean x 2 in hospital but weaned off at home. Echo 12/17 with EF up to 45%, diffuse hypokinesis.  NYHA II. Volume status stable. Weight is up, but I think this is due to excessive caloric intake.  - Continue Coreg 25 mg bid.  - Continue torsemide 20 mg daily.  BMET today.  - Continue current spironolactone.  - Continue Entresto 97/103 bid.    - EF out of ICD range.  I will get  repeat echo in 6/18.  2. Ischemic toes: Improved, will not need surgery.  3. RUE DVT: Confirmed on Ultrasound 2/27. Off coumadin in 8/17, now on ASA 81 daily.   4. Depression: Continue Celexa 20 mg daily.  5. Obesity: Weight is up considerably.  I do not think that he is volume overloaded, suspect that he has gained weight due to caloric intake. We spent considerable time discussing increased exercise and diet for weight loss.   Followup in 6/18 with echo.    Marca Ancona 10/20/2016

## 2016-10-24 ENCOUNTER — Other Ambulatory Visit (HOSPITAL_COMMUNITY): Payer: Self-pay | Admitting: Student

## 2016-11-14 ENCOUNTER — Other Ambulatory Visit (HOSPITAL_COMMUNITY): Payer: Self-pay | Admitting: Internal Medicine

## 2016-11-14 DIAGNOSIS — I5022 Chronic systolic (congestive) heart failure: Secondary | ICD-10-CM

## 2017-01-12 ENCOUNTER — Other Ambulatory Visit (HOSPITAL_COMMUNITY): Payer: Self-pay | Admitting: Cardiology

## 2017-01-19 ENCOUNTER — Encounter (HOSPITAL_COMMUNITY): Payer: Self-pay

## 2017-01-19 ENCOUNTER — Ambulatory Visit (HOSPITAL_COMMUNITY)
Admission: RE | Admit: 2017-01-19 | Discharge: 2017-01-19 | Disposition: A | Discharge status: Home / Self Care | Payer: BLUE CROSS/BLUE SHIELD | Source: Ambulatory Visit | Attending: Cardiology | Admitting: Cardiology

## 2017-01-19 ENCOUNTER — Ambulatory Visit (HOSPITAL_BASED_OUTPATIENT_CLINIC_OR_DEPARTMENT_OTHER)
Admission: RE | Admit: 2017-01-19 | Discharge: 2017-01-19 | Disposition: A | Discharge status: Home / Self Care | Payer: BLUE CROSS/BLUE SHIELD | Source: Ambulatory Visit | Attending: Cardiology | Admitting: Cardiology

## 2017-01-19 VITALS — BP 134/64 | HR 76 | Wt 297.2 lb

## 2017-01-19 DIAGNOSIS — I5022 Chronic systolic (congestive) heart failure: Secondary | ICD-10-CM

## 2017-01-19 DIAGNOSIS — E669 Obesity, unspecified: Secondary | ICD-10-CM | POA: Insufficient documentation

## 2017-01-19 DIAGNOSIS — F329 Major depressive disorder, single episode, unspecified: Secondary | ICD-10-CM | POA: Diagnosis not present

## 2017-01-19 DIAGNOSIS — Z7982 Long term (current) use of aspirin: Secondary | ICD-10-CM | POA: Diagnosis not present

## 2017-01-19 DIAGNOSIS — Z79899 Other long term (current) drug therapy: Secondary | ICD-10-CM | POA: Insufficient documentation

## 2017-01-19 DIAGNOSIS — Z86718 Personal history of other venous thrombosis and embolism: Secondary | ICD-10-CM | POA: Diagnosis not present

## 2017-01-19 DIAGNOSIS — I429 Cardiomyopathy, unspecified: Secondary | ICD-10-CM | POA: Diagnosis not present

## 2017-01-19 DIAGNOSIS — I82621 Acute embolism and thrombosis of deep veins of right upper extremity: Secondary | ICD-10-CM | POA: Insufficient documentation

## 2017-01-19 LAB — ECHOCARDIOGRAM COMPLETE
CHL CUP TV REG PEAK VELOCITY: 265 cm/s
E decel time: 187 msec
EERAT: 5.85
FS: 36 % (ref 28–44)
IVS/LV PW RATIO, ED: 0.87
LA ID, A-P, ES: 43 mm
LA diam end sys: 43 mm
LA diam index: 1.69 cm/m2
LA vol A4C: 51.3 ml
LA vol index: 20.8 mL/m2
LAVOL: 52.8 mL
LV E/e' medial: 5.85
LV TDI E'MEDIAL: 10.2
LV e' LATERAL: 16.4 cm/s
LVEEAVG: 5.85
LVOT area: 4.15 cm2
LVOTD: 23 mm
Lateral S' vel: 13.3 cm/s
MV Dec: 187
MV Peak grad: 4 mmHg
MVPKAVEL: 62.6 m/s
MVPKEVEL: 96 m/s
PW: 7.92 mm — AB (ref 0.6–1.1)
RV TAPSE: 23.4 mm
RV sys press: 31 mmHg
TDI e' lateral: 16.4
TR max vel: 265 cm/s

## 2017-01-19 LAB — BASIC METABOLIC PANEL
Anion gap: 4 — ABNORMAL LOW (ref 5–15)
BUN: 9 mg/dL (ref 6–20)
CHLORIDE: 105 mmol/L (ref 101–111)
CO2: 29 mmol/L (ref 22–32)
Calcium: 9.9 mg/dL (ref 8.9–10.3)
Creatinine, Ser: 0.86 mg/dL (ref 0.61–1.24)
GFR calc Af Amer: >60 mL/min (ref >60)
GFR calc non Af Amer: >60 mL/min (ref >60)
Glucose, Bld: 83 mg/dL (ref 65–99)
POTASSIUM: 4.3 mmol/L (ref 3.5–5.1)
SODIUM: 138 mmol/L (ref 135–145)

## 2017-01-19 MED ORDER — SPIRONOLACTONE 25 MG PO TABS: 25.0000 mg | ORAL_TABLET | Freq: Every day | ORAL | 6 refills | Status: DC

## 2017-01-19 MED ORDER — SACUBITRIL-VALSARTAN 97-103 MG PO TABS: 1.0000 | ORAL_TABLET | Freq: Two times a day (BID) | ORAL | 6 refills | Status: DC

## 2017-01-19 MED ORDER — FAMOTIDINE 20 MG PO TABS: 20.0000 mg | ORAL_TABLET | Freq: Two times a day (BID) | ORAL | 6 refills | Status: DC

## 2017-01-19 MED ORDER — MAGNESIUM OXIDE -MG SUPPLEMENT 400 (240 MG) MG PO TABS: 1.0000 | ORAL_TABLET | Freq: Two times a day (BID) | ORAL | 6 refills | Status: DC

## 2017-01-19 MED ORDER — TORSEMIDE 20 MG PO TABS: 20.0000 mg | ORAL_TABLET | Freq: Every day | ORAL | 6 refills | Status: DC

## 2017-01-19 MED ORDER — POTASSIUM CHLORIDE CRYS ER 20 MEQ PO TBCR: 40.0000 meq | EXTENDED_RELEASE_TABLET | Freq: Every day | ORAL | 6 refills | Status: DC

## 2017-01-19 MED ORDER — CITALOPRAM HYDROBROMIDE 20 MG PO TABS: 20.0000 mg | ORAL_TABLET | Freq: Every day | ORAL | 6 refills | Status: DC

## 2017-01-19 MED ORDER — CARVEDILOL 25 MG PO TABS: 25.0000 mg | ORAL_TABLET | Freq: Two times a day (BID) | ORAL | 6 refills | Status: DC

## 2017-01-19 NOTE — Patient Instructions (Signed)
Labs today (will call for abnormal results, otherwise no news is good news)  Labs in 3 Months (BMET)  Follow up in 6 Months, we will contact you to schedule appointment.

## 2017-01-19 NOTE — Progress Notes (Signed)
  Echocardiogram 2D Echocardiogram has been performed.  Delcie Roch 01/19/2017, 2:49 PM

## 2017-01-20 NOTE — Progress Notes (Signed)
Patient ID: James Bennett, male   DOB: May 15, 1996, 21 y.o.   MRN: [id number]  Cardiology: Dr Shirlee Latch   HPI: James Bennett is a 21 year old with a no medical history until hospital admit February 20th, 2017  for acute dyspnea . He was admitted with acute respiratory failure complicated by cardiogenic shock, ischemic toes and RUE DVT. Due to low output he had ischemic toes. Vascular evaluated. He did not require surgical interventio. EF 15% by echo with mildly dilated and mildly dysfunctional RV. Low output HF with marked volume overload initially. Was on milrinone but able to titrate off initially. RHC on 2/23 showed good cardiac output off milrinone but still very volume overloaded. Milrinone restarted 2/25 for recurrent profound shock. Concern for viral myocarditis, has had viral-type symptoms for several weeks and has had antibiotic courses at home prior to admission. Coxsackie Antibody A + so suspect viral myocarditis (IgG rather than IgM, so not totally sure how recent exposure was). cMRI with EF 29%, poor contrast study so not able to comment on infiltrative disease. Failed milrinone wean x 2, went home on IV milrinone.  However, we were able to wean off milrinone as an outpatient.   He continues to do well.  He is able to do what he wants now without limitation.  Unfortunately, his diet is fairly poor and his weight is up 9 lbs.  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  No edema.  No exertional dyspnea.  No fatigue. No orthopnea/PND.  No lightheadedness/syncope. Toes have healed, will not have to have surgery.     Echo in 6/17 showed EF 35-40% with normal diastolic function and normal RV. Repeat echo in 12/17 showed EF up to 45% with diffuse hypokinesis.  Repeat echo was done today and reviewed, EF up to 45-50%.   Labs (3/17): HCT 30.6, K 3.8, creatinine 0.74, co-ox 56%.  Labs (4/17): K 4.5, creatinine 0.71 => 0.73, BNP 174, digoxin 0.9 Labs  (5/17): K 4.6, creatinine 0.79, digoxin 1.0, HCT 38 Labs (7/17): K 3.9, creatinine 0.65, digoxin 0.5 Labs (9/17): digoxin 0.6, K 4.1, creatinine 0.69, BNP 23 Labs (12/17): K 3.8, creatinine 0.65 Labs (3/18): K 4, creatinine 0.68, BNP 20.5  PMH: 1. Nonischemic cardiomyopathy: 2/17 admitted with cardiogenic shock.  Suspected acute myocarditis, coxsackievirus A positive.  He was started on milrinone, titrated off in 3/17.  - Echo (2/17) with EF 15%, diffuse hypokinesis, mildly decreased RV systolic function.  - Cardiac MRI (3/17) with EF 29%, moderately dilated LV, mildly decreased RV systolic function, unable to assess for late gadolinium enhancement (poor quality contrast). - RHC (2/17) with mean RA 19, PA 51/32 mean 45, PCWP mean 40, CI 2.24 Fick/2.56 thermo.  - Blood type A+. - Echo (6/17): EF 35-40%, normal diastolic function, normal RV size and systolic function, PASP 42 mmHg, GLS-16.4%. - Echo (12/17): EF 45%, diffuse hypokinesis.   - Echo (6/18): EF 45-50%, diffuse hypokinesis, normal diastolic function, normal RV size and systolic function.  2. Ischemic toes: Suspect due to low output/cardiogenic shock.  3. RUE DVT: Now on coumadin.  4. NSVT 5. Depression  ROS: All systems negative except as listed in HPI, PMH and Problem List.  SH:  Social History   Social History  . Marital status: Single    Spouse name: N/A  . Number of children: N/A  . Years of education: N/A   Occupational History  . Not on file.  Social History Main Topics  . Smoking status: Never Smoker  . Smokeless tobacco: Never Used  . Alcohol use No  . Drug use: No  . Sexual activity: Not on file   Other Topics Concern  . 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 Bird exposure through his father's workplace of a small parrot. No mold exposure. No recent travel.    FH:  Family History  Problem Relation Age of Onset   . Lupus Mother     Current Outpatient Prescriptions  Medication Sig Dispense Refill  . acetaminophen (TYLENOL) 325 MG tablet Take 2 tablets (650 mg total) by mouth every 4 (four) hours as needed for headache or mild pain.    [name] aspirin EC 81 MG tablet Take 1 tablet (81 mg total) by mouth daily. 90 tablet 3  . carvedilol (COREG) 25 MG tablet Take 1 tablet (25 mg total) by mouth 2 (two) times daily with a meal. 60 tablet 6  . citalopram (CELEXA) 20 MG tablet Take 1 tablet (20 mg total) by mouth daily. 30 tablet 6  . famotidine (PEPCID) 20 MG tablet Take 1 tablet (20 mg total) by mouth 2 (two) times daily. Contact CHF clinic if you think you no longer need this medication (for reflux). 60 tablet 6  . hydrocortisone cream 1 % Apply topically every 2 (two) hours as needed for itching. 30 g 0  . Magnesium Oxide 400 (240 Mg) MG TABS Take 1 tablet (400 mg total) by mouth 2 (two) times daily. 60 tablet 6  . potassium chloride SA (K-DUR,KLOR-CON) 20 MEQ tablet Take 2 tablets (40 mEq total) by mouth daily. 60 tablet 6  . sacubitril-valsartan (ENTRESTO) 97-103 MG Take 1 tablet by mouth 2 (two) times daily. 60 tablet 6  . spironolactone (ALDACTONE) 25 MG tablet Take 1 tablet (25 mg total) by mouth daily. 30 tablet 6  . torsemide (DEMADEX) 20 MG tablet Take 1 tablet (20 mg total) by mouth daily. 30 tablet 6   No current facility-administered medications for this encounter.     Vitals:   01/19/17 1442  BP: 134/64  Pulse: 76  SpO2: 99%  Weight: 297 lb 4 oz (134.8 kg)    PHYSICAL EXAM: General:  NAD  HEENT: normal, anicteric Neck: supple. JVP not elevated. Carotids 2+ bilaterally; no bruits. No lymphadenopathy or thryomegaly appreciated. Cor: PMI normal. Regular rate & rhythm. No rubs, gallops or murmurs. No S3  Lungs: clear to auscultation bilaterally.  Abdomen: soft, nontender, nondistended. No hepatosplenomegaly. No bruits or masses. Good bowel sounds. Extremities: no cyanosis, clubbing, rash.   No edema.  Neuro: alert & orientedx3, cranial nerves grossly intact. Moves all 4 extremities w/o difficulty. Affect pleasant.  ASSESSMENT & PLAN: 1. Chronic systolic CHF:  2/17 admit with cardiogenic shock.  EF 15% by echo with mildly dilated and mildly dysfunctional RV. Was on milrinone but able to titrate off initially. RHC on 2/23 showed good cardiac output off milrinone but still very volume overloaded. Milrinone restarted 2/25 for recurrent profound shock. Concern for viral myocarditis, had had viral-type symptoms for several weeks and had had antibiotic courses at home prior to admission. Coxsackie Antibody A + so suspect viral myocarditis (IgG rather than IgM, so not totally sure how recent exposure was). cMRI with EF 29%, poor contrast study so not able to comment on infiltrative disease. Failed milrinone wean x 2 in hospital but weaned  off at home. Echo 12/17 with EF up to 45%, diffuse hypokinesis, repeat echo today with EF 45-50%.  NYHA II. Volume status stable. Weight is up, but I think this is again due to excessive caloric intake.  - Continue Coreg 25 mg bid.  - Continue torsemide 20 mg daily.  BMET today.  - Continue current spironolactone.  - Continue Entresto 97/103 bid.    - EF out of ICD range.  I will get repeat echo in 6/19.  2. Ischemic toes: Improved, will not need surgery.  3. RUE DVT: Confirmed on Ultrasound 2/27. Off coumadin in 8/17, now on ASA 81 daily.   4. Depression: Continue Celexa 20 mg daily.  5. Obesity: Weight is up again considerably.  I do not think that he is volume overloaded, suspect that he has gained weight due to caloric intake. We spent considerable time discussing increased exercise and diet for weight loss.   Followup in 6 months.  He will need BMET again in 3 months.   Marca Ancona 01/20/2017

## 2017-04-19 ENCOUNTER — Other Ambulatory Visit (HOSPITAL_COMMUNITY): Payer: BLUE CROSS/BLUE SHIELD

## 2017-05-03 ENCOUNTER — Other Ambulatory Visit (HOSPITAL_COMMUNITY): Payer: Self-pay | Admitting: *Deleted

## 2017-05-03 ENCOUNTER — Ambulatory Visit (HOSPITAL_COMMUNITY)
Admission: RE | Admit: 2017-05-03 | Discharge: 2017-05-03 | Disposition: A | Discharge status: Home / Self Care | Payer: BLUE CROSS/BLUE SHIELD | Source: Ambulatory Visit | Attending: Internal Medicine | Admitting: Internal Medicine

## 2017-05-03 DIAGNOSIS — I5022 Chronic systolic (congestive) heart failure: Secondary | ICD-10-CM | POA: Insufficient documentation

## 2017-05-03 LAB — BASIC METABOLIC PANEL
ANION GAP: 9 (ref 5–15)
BUN: 9 mg/dL (ref 6–20)
CHLORIDE: 106 mmol/L (ref 101–111)
CO2: 24 mmol/L (ref 22–32)
Calcium: 9.6 mg/dL (ref 8.9–10.3)
Creatinine, Ser: 0.76 mg/dL (ref 0.61–1.24)
GFR calc non Af Amer: >60 mL/min (ref >60)
GLUCOSE: 108 mg/dL — AB (ref 65–99)
Potassium: 4.4 mmol/L (ref 3.5–5.1)
Sodium: 139 mmol/L (ref 135–145)

## 2017-07-12 ENCOUNTER — Ambulatory Visit (HOSPITAL_COMMUNITY)
Admission: RE | Admit: 2017-07-12 | Discharge: 2017-07-12 | Disposition: A | Discharge status: Home / Self Care | Payer: BLUE CROSS/BLUE SHIELD | Source: Ambulatory Visit | Attending: Cardiology | Admitting: Cardiology

## 2017-07-12 ENCOUNTER — Encounter (HOSPITAL_COMMUNITY): Payer: Self-pay | Admitting: Cardiology

## 2017-07-12 VITALS — BP 126/89 | HR 102 | Wt 313.1 lb

## 2017-07-12 DIAGNOSIS — Z7982 Long term (current) use of aspirin: Secondary | ICD-10-CM | POA: Insufficient documentation

## 2017-07-12 DIAGNOSIS — I998 Other disorder of circulatory system: Secondary | ICD-10-CM | POA: Diagnosis not present

## 2017-07-12 DIAGNOSIS — Z79899 Other long term (current) drug therapy: Secondary | ICD-10-CM | POA: Insufficient documentation

## 2017-07-12 DIAGNOSIS — I428 Other cardiomyopathies: Secondary | ICD-10-CM | POA: Insufficient documentation

## 2017-07-12 DIAGNOSIS — I420 Dilated cardiomyopathy: Secondary | ICD-10-CM | POA: Diagnosis not present

## 2017-07-12 DIAGNOSIS — I82621 Acute embolism and thrombosis of deep veins of right upper extremity: Secondary | ICD-10-CM | POA: Insufficient documentation

## 2017-07-12 DIAGNOSIS — E669 Obesity, unspecified: Secondary | ICD-10-CM | POA: Diagnosis not present

## 2017-07-12 DIAGNOSIS — I5022 Chronic systolic (congestive) heart failure: Secondary | ICD-10-CM

## 2017-07-12 DIAGNOSIS — I82401 Acute embolism and thrombosis of unspecified deep veins of right lower extremity: Secondary | ICD-10-CM | POA: Diagnosis not present

## 2017-07-12 DIAGNOSIS — F329 Major depressive disorder, single episode, unspecified: Secondary | ICD-10-CM | POA: Insufficient documentation

## 2017-07-12 LAB — CBC
HCT: 45.6 % (ref 39.0–52.0)
Hemoglobin: 15.2 g/dL (ref 13.0–17.0)
MCH: 30.2 pg (ref 26.0–34.0)
MCHC: 33.3 g/dL (ref 30.0–36.0)
MCV: 90.5 fL (ref 78.0–100.0)
Platelets: 321 10*3/uL (ref 150–400)
RBC: 5.04 MIL/uL (ref 4.22–5.81)
RDW: 13.3 % (ref 11.5–15.5)
WBC: 6.9 10*3/uL (ref 4.0–10.5)

## 2017-07-12 LAB — BASIC METABOLIC PANEL
Anion gap: 9 (ref 5–15)
BUN: 12 mg/dL (ref 6–20)
CALCIUM: 9.6 mg/dL (ref 8.9–10.3)
CO2: 21 mmol/L — ABNORMAL LOW (ref 22–32)
CREATININE: 0.73 mg/dL (ref 0.61–1.24)
Chloride: 100 mmol/L — ABNORMAL LOW (ref 101–111)
GFR calc Af Amer: >60 mL/min (ref >60)
GLUCOSE: 91 mg/dL (ref 65–99)
Potassium: 4 mmol/L (ref 3.5–5.1)
Sodium: 130 mmol/L — ABNORMAL LOW (ref 135–145)

## 2017-07-12 MED ORDER — CITALOPRAM HYDROBROMIDE 20 MG PO TABS: 20.0000 mg | ORAL_TABLET | Freq: Every day | ORAL | 6 refills | Status: DC

## 2017-07-12 MED ORDER — SACUBITRIL-VALSARTAN 97-103 MG PO TABS
1.0000 | ORAL_TABLET | Freq: Two times a day (BID) | ORAL | 6 refills | Status: DC
Start: 2017-07-12 — End: 2018-02-06

## 2017-07-12 MED ORDER — TORSEMIDE 20 MG PO TABS: 20.0000 mg | ORAL_TABLET | Freq: Every day | ORAL | 6 refills | Status: DC

## 2017-07-12 MED ORDER — SPIRONOLACTONE 25 MG PO TABS: 25.0000 mg | ORAL_TABLET | Freq: Every day | ORAL | 6 refills | Status: DC

## 2017-07-12 MED ORDER — CARVEDILOL 25 MG PO TABS: 25.0000 mg | ORAL_TABLET | Freq: Two times a day (BID) | ORAL | 6 refills | Status: DC

## 2017-07-12 NOTE — Progress Notes (Signed)
Patient ID: James Bennett, male   DOB: Nov 08, 1995, 21 y.o.   MRN: [id number]  Cardiology: Dr Shirlee Latch   HPI:  James Bennett is a 21 y.o. male with no medical history until hospital admit February 20th, 2017  for acute dyspnea . He was admitted with acute respiratory failure complicated by cardiogenic shock, ischemic toes and RUE DVT. Due to low output he had ischemic toes. Vascular evaluated. He did not require surgical interventio. EF 15% by echo with mildly dilated and mildly dysfunctional RV. Low output HF with marked volume overload initially. Was on milrinone but able to titrate off initially. RHC on 2/23 showed good cardiac output off milrinone but still very volume overloaded. Milrinone restarted 2/25 for recurrent profound shock. Concern for viral myocarditis, has had viral-type symptoms for several weeks and has had antibiotic courses at home prior to admission. Coxsackie Antibody A + so suspect viral myocarditis (IgG rather than IgM, so not totally sure how recent exposure was). cMRI with EF 29%, poor contrast study so not able to comment on infiltrative disease. Failed milrinone wean x 2, went home on IV milrinone.  However, we were able to wean off milrinone as an outpatient.   Echo in 6/17 showed EF 35-40% with normal diastolic function and normal RV. Repeat echo in 12/17 showed EF up to 45% with diffuse hypokinesis.  Repeat echo was done 6/18, EF up to 45-50%.   He presents today for regular follow up. At last visit was up 9 lbs, but thought to be adipose.  This visit, up another 16 lbs. Does funnel cakes/fair foods.  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 No specific exercise. Denies any SOB. Denies lightheadedness or dizziness. Taking all medications. Toes have completely healed. No peripheral edema or orthopnea.   EKG today shows NSR 96 bpm  Labs (3/17): HCT 30.6, K 3.8, creatinine 0.74, co-ox 56%.  Labs (4/17): K 4.5,  creatinine 0.71 => 0.73, BNP 174, digoxin 0.9 Labs (5/17): K 4.6, creatinine 0.79, digoxin 1.0, HCT 38 Labs (7/17): K 3.9, creatinine 0.65, digoxin 0.5 Labs (9/17): digoxin 0.6, K 4.1, creatinine 0.69, BNP 23 Labs (12/17): K 3.8, creatinine 0.65 Labs (3/18): K 4, creatinine 0.68, BNP 20.5 Labs (10/18) : K 4.4, creatinine 0.76  PMH: 1. Nonischemic cardiomyopathy: 2/17 admitted with cardiogenic shock.  Suspected acute myocarditis, coxsackievirus A positive.  He was started on milrinone, titrated off in 3/17.  - Echo (2/17) with EF 15%, diffuse hypokinesis, mildly decreased RV systolic function.  - Cardiac MRI (3/17) with EF 29%, moderately dilated LV, mildly decreased RV systolic function, unable to assess for late gadolinium enhancement (poor quality contrast). - RHC (2/17) with mean RA 19, PA 51/32 mean 45, PCWP mean 40, CI 2.24 Fick/2.56 thermo.  - Blood type A+. - Echo (6/17): EF 35-40%, normal diastolic function, normal RV size and systolic function, PASP 42 mmHg, GLS-16.4%. - Echo (12/17): EF 45%, diffuse hypokinesis.   - Echo (6/18): EF 45-50%, diffuse hypokinesis, normal diastolic function, normal RV size and systolic function.  2. Ischemic toes: Suspect due to low output/cardiogenic shock.  3. RUE DVT: Now on coumadin.  4. NSVT 5. Depression  Review of systems complete and found to be negative unless listed in HPI.    SH:  Social History   Socioeconomic History  . Marital status: Single    Spouse name: Not on file  . Number of children: Not on  file  . Years of education: Not on file  . Highest education level: Not on file  Social Needs  . Financial resource strain: Not on file  . Food insecurity - worry: Not on file  . Food insecurity - inability: Not on file  . Transportation needs - medical: Not on file  . Transportation needs - non-medical: Not on file  Occupational History  . Not on file  Tobacco Use  . Smoking status: Never Smoker  . Smokeless tobacco: Never  Used  Substance and Sexual Activity  . Alcohol use: No  . Drug use: No  . Sexual activity: Not on file  Other Topics Concern  . 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 Bird exposure through his father's workplace of a small parrot. No mold exposure. No recent travel.   FH:  Family History  Problem Relation Age of Onset  . Lupus Mother    Current Outpatient Medications  Medication Sig Dispense Refill  . acetaminophen (TYLENOL) 325 MG tablet Take 2 tablets (650 mg total) by mouth every 4 (four) hours as needed for headache or mild pain.    [name] aspirin EC 81 MG tablet Take 1 tablet (81 mg total) by mouth daily. 90 tablet 3  . carvedilol (COREG) 25 MG tablet Take 1 tablet (25 mg total) by mouth 2 (two) times daily with a meal. 60 tablet 6  . citalopram (CELEXA) 20 MG tablet Take 1 tablet (20 mg total) by mouth daily. 30 tablet 6  . famotidine (PEPCID) 20 MG tablet Take 1 tablet (20 mg total) by mouth 2 (two) times daily. Contact CHF clinic if you think you no longer need this medication (for reflux). 60 tablet 6  . hydrocortisone cream 1 % Apply topically every 2 (two) hours as needed for itching. 30 g 0  . Magnesium Oxide 400 (240 Mg) MG TABS Take 1 tablet (400 mg total) by mouth 2 (two) times daily. 60 tablet 6  . potassium chloride SA (K-DUR,KLOR-CON) 20 MEQ tablet Take 2 tablets (40 mEq total) by mouth daily. 60 tablet 6  . sacubitril-valsartan (ENTRESTO) 97-103 MG Take 1 tablet by mouth 2 (two) times daily. 60 tablet 6  . spironolactone (ALDACTONE) 25 MG tablet Take 1 tablet (25 mg total) by mouth daily. 30 tablet 6  . torsemide (DEMADEX) 20 MG tablet Take 1 tablet (20 mg total) by mouth daily. 30 tablet 6   No current facility-administered medications for this encounter.    Vitals:   07/12/17 1427  BP: 126/89  Pulse: (!) 102  SpO2: 98%  Weight: (!) 313 lb 1.9 oz (142 kg)   Wt Readings from Last  3 Encounters:  07/12/17 (!) 313 lb 1.9 oz (142 kg)  01/19/17 297 lb 4 oz (134.8 kg)  10/19/16 288 lb 8 oz (130.9 kg)   PHYSICAL EXAM: General: Well appearing. No resp difficulty. HEENT: Normal Neck: Supple. Thick. JVP difficult to assess. Carotids 2+ bilat; no bruits. No thyromegaly or nodule noted. Cor: PMI nondisplaced. RRR, slightly tachy. No M/G/R noted Lungs: CTAB, normal effort. Abdomen: Obese, Soft, non-tender, non-distended, no HSM. No bruits or masses. +BS  Extremities: No cyanosis, clubbing, or rash. R and LLE no edema.  Neuro: Alert & orientedx3, cranial nerves grossly intact. moves all 4 extremities w/o difficulty. Affect pleasant   ASSESSMENT & PLAN: 1. Chronic systolic CHF:  2/17 admit with cardiogenic shock.  EF 15%  by echo with mildly dilated and mildly dysfunctional RV. Was on milrinone but able to titrate off initially. RHC on 2/23 showed good cardiac output off milrinone but still very volume overloaded. Milrinone restarted 2/25 for recurrent profound shock. Concern for viral myocarditis, had had viral-type symptoms for several weeks and had had antibiotic courses at home prior to admission. Coxsackie Antibody A + so suspect viral myocarditis (IgG rather than IgM, so not totally sure how recent exposure was). cMRI with EF 29%, poor contrast study so not able to comment on infiltrative disease. Failed milrinone wean x 2 in hospital but weaned off at home. Echo 12/17 with EF up to 45%, diffuse hypokinesis,  - Echo 12/2016 EF 45-50%.   - NYHA II - Volume status stable.  - Continue Coreg 25 mg BID - Continue torsemide 20 mg daily.  BMET today.  - Continue current spironolactone.  - Continue Entresto 97/103 bid.    - EF out of ICD range. Plan for repeat Echo in 6/19.  - EKG today with NSR in 90s. 2. Ischemic toes:  - Resolved. No need for surgery.   3. RUE DVT: -  Confirmed on Ultrasound 2/27. Off coumadin in 8/17, now on ASA 81 daily.   4. Depression:  -  Continue Celexa 20 mg daily.  5. Obesity:  - Weight continues to trend up - Do not think that he is volume overloaded.  - Discussed importance of weight loss, especially with his predisposition for heart failure. Encouraged exercise, portion control, and dieting. Specifically encouraged elliptical and treadmill.   Labs today. RTC 6 months with Echo for continued screening.   Graciella FreerMichael Andrew Tillery, PA-C  07/12/2017   Patient seen with PA, agree with the above note.  His weight is up considerably but think this is adipose rather than volume overload.   We had a long discussion about dietary changes and exercise.  He really needs to work on losing weight.    I will continue his current meds.  Will need BMET today.   I will have him get a repeat echo in 6/19 with followup.  Echo in 6/18 showed EF improved to 45-50%.   Marca AnconaDalton McLean 07/12/2017

## 2017-07-12 NOTE — Patient Instructions (Signed)
Labs drawn today (if we do not call you, then your lab work was stable)   Your physician has requested that you have an echocardiogram. Echocardiography is a painless test that uses sound waves to create images of your heart. It provides your doctor with information about the size and shape of your heart and how well your heart's chambers and valves are working. This procedure takes approximately one hour. There are no restrictions for this procedure.  Your physician recommends that you schedule a follow-up appointment in: 6 months with Dr. McLean   an a echocardiogram    

## 2017-10-10 IMAGING — US US EXTREM LOW VENOUS BILAT
1 series · 14 of 24 positions shown · non-contrast
Comparison: None

CLINICAL DATA: bilateral lower extremity swelling x1 0.5 weeks

EXAM:
BILATERAL LOWER EXTREMITY VENOUS DOPPLER ULTRASOUND
TECHNIQUE: Gray-scale sonography with compression, as well as color and duplex
ultrasound, were performed to evaluate the deep venous system from
the level of the common femoral vein through the popliteal and
proximal calf veins.

[Series 1: us extrem low venous bilat · 0.09mm/px · 40 acquisitions, 14 frames shown]
[im 1/40]
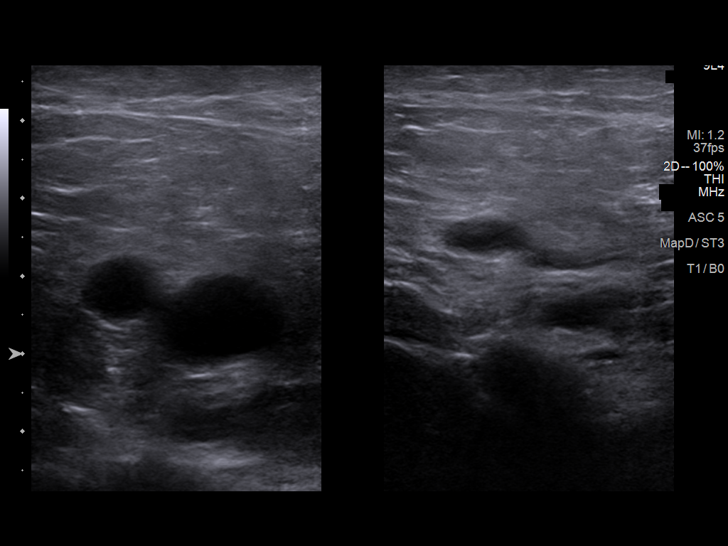
[im 4/40]
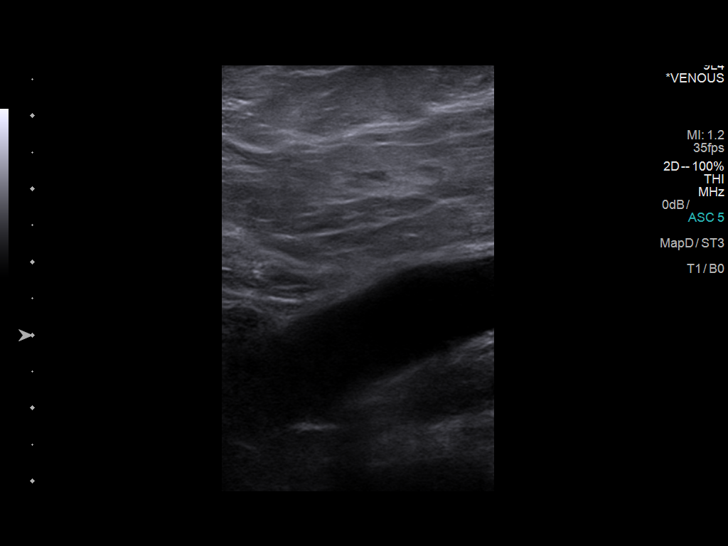
[im 7/40]
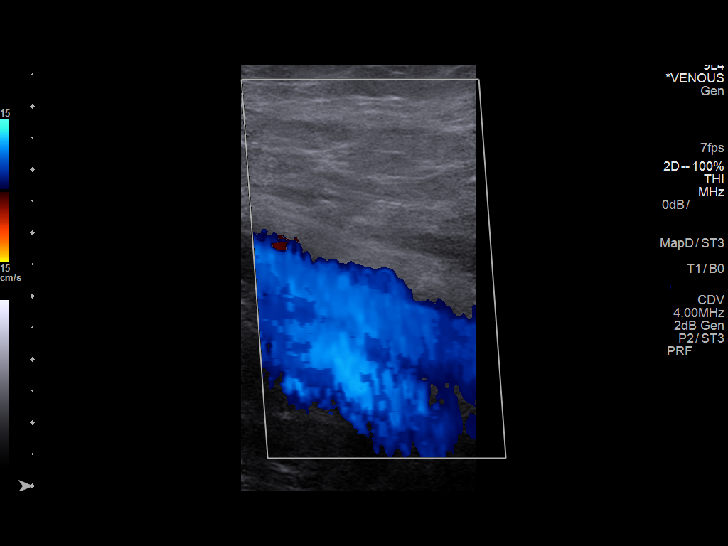
[im 11/40]
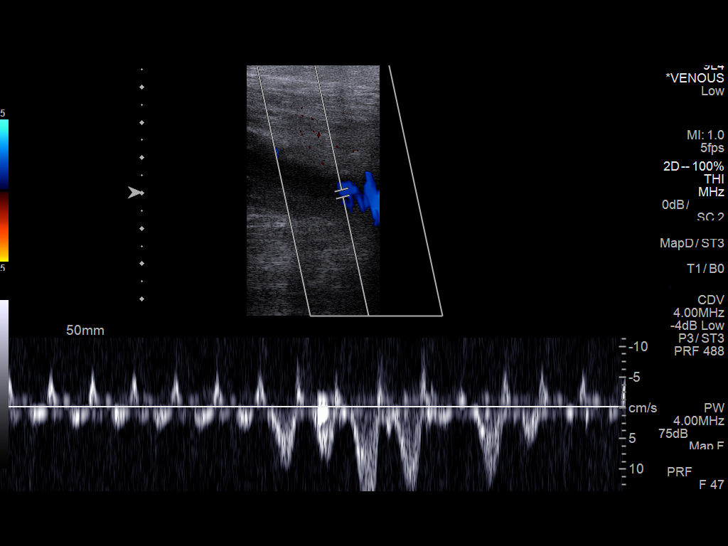
[im 12/40]
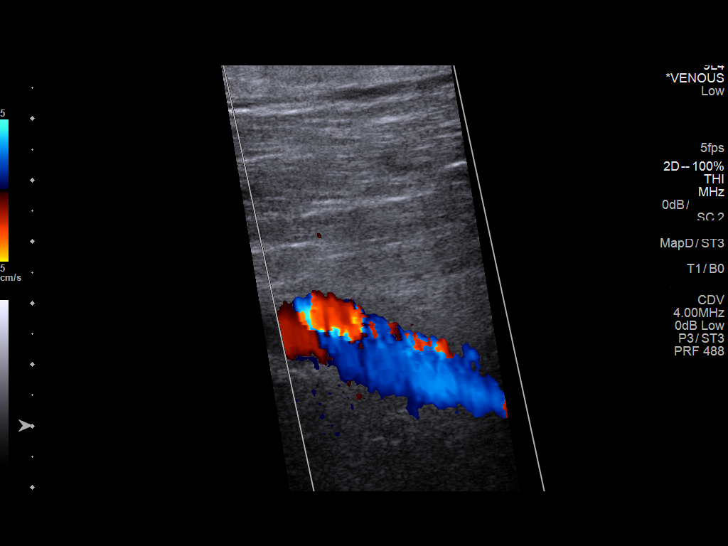
[im 16/40]
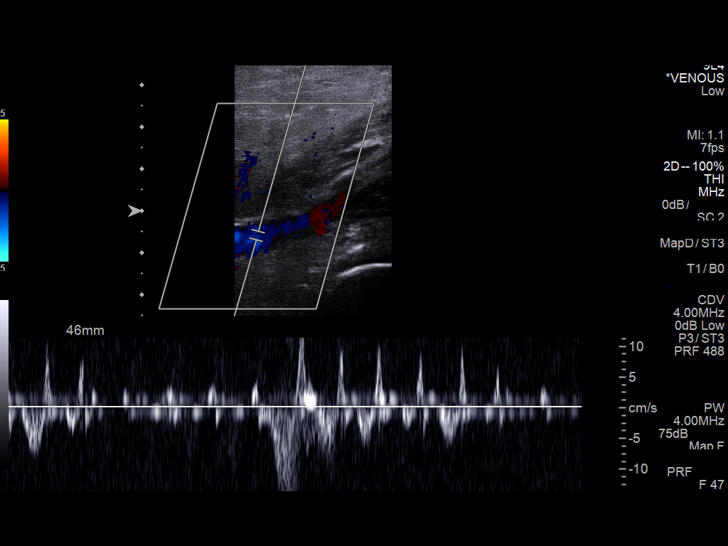
[im 19/40]
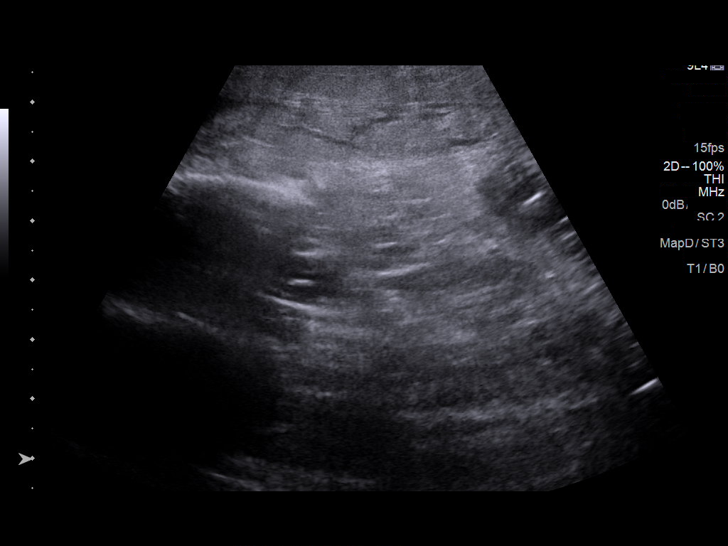
[im 21/40]
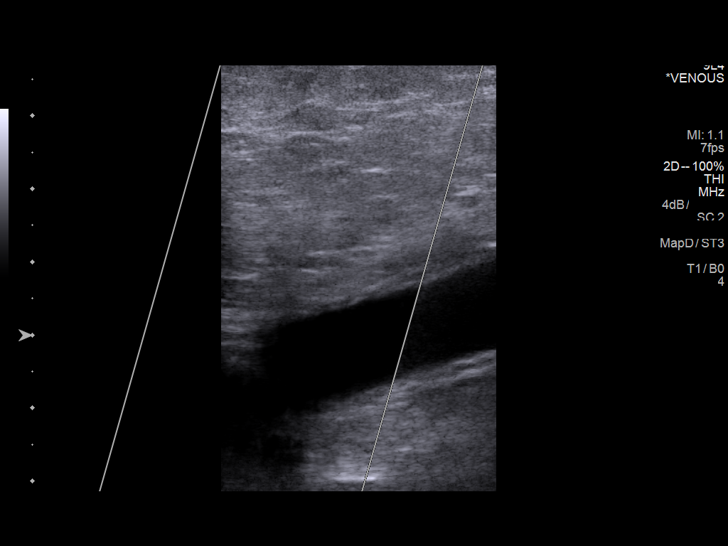
[im 24/40]
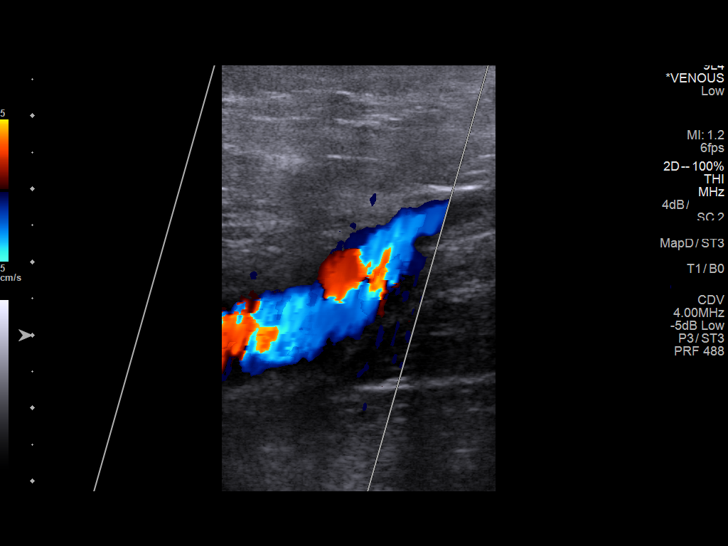
[im 28/40]
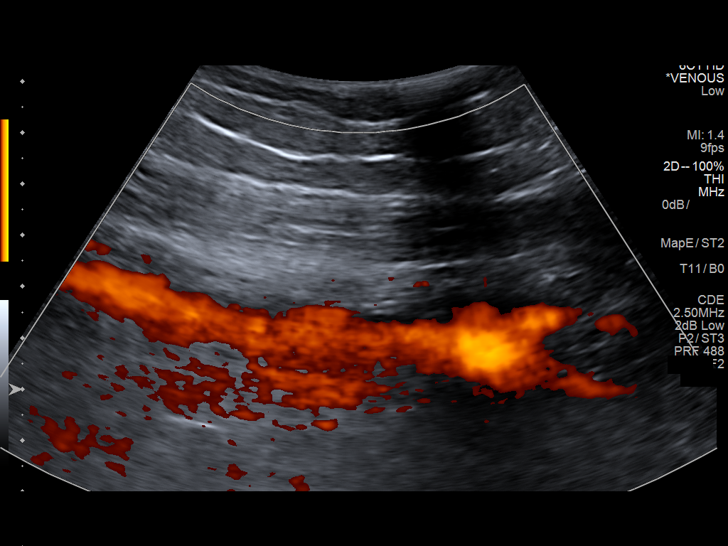
[im 31/40]
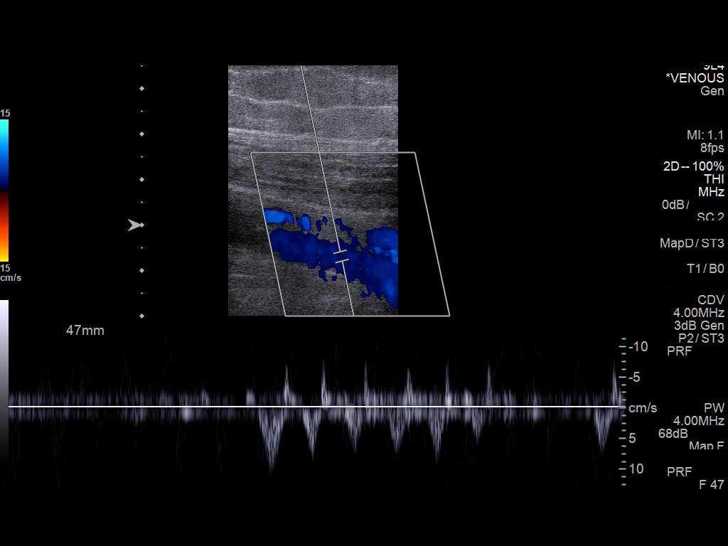
[im 33/40]
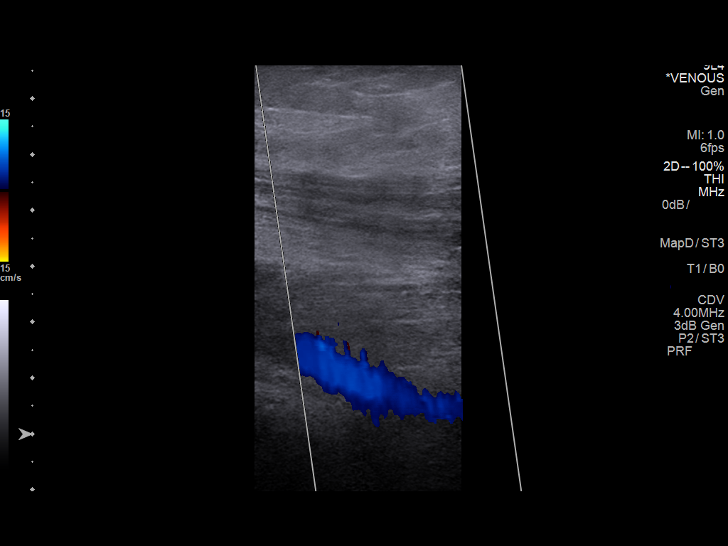
[im 36/40]
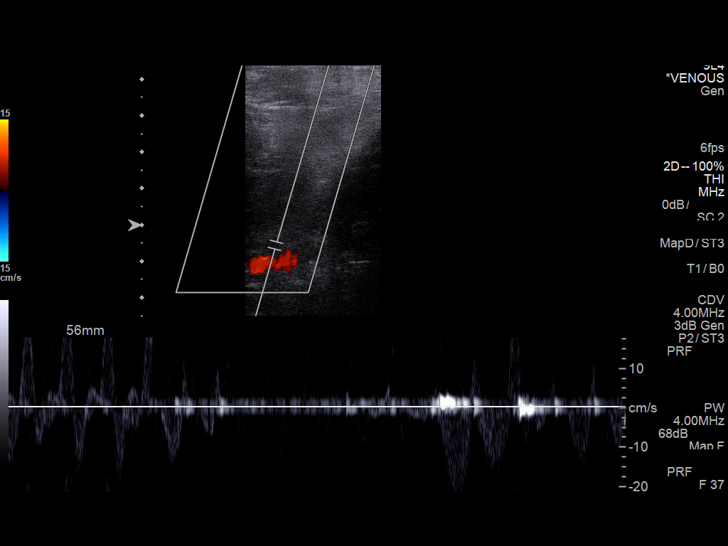
[im 40/40]
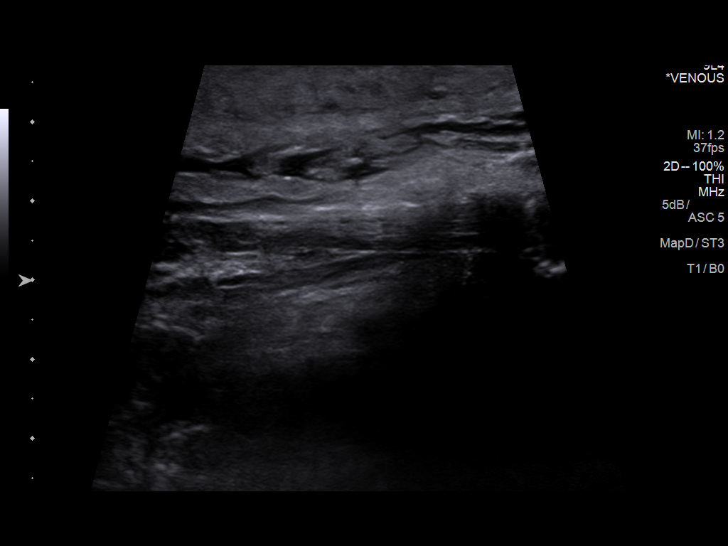

[14 of 24 positions shown; findings below may reference images not displayed]

FINDINGS: Normal compressibility of the common femoral, superficial femoral,
and popliteal veins, as well as the proximal calf veins. No filling
defects to suggest DVT on grayscale or color Doppler imaging.
Doppler waveforms show normal direction of venous flow, normal
respiratory phasicity and response to augmentation. Marked
subcutaneous edema right thigh, left ankle. Survey views of the
contralateral common femoral vein are unremarkable.
IMPRESSION: 1. No evidence of lower extremity deep vein thrombosis, BILATERALLY.

## 2017-11-10 ENCOUNTER — Other Ambulatory Visit (HOSPITAL_COMMUNITY): Payer: Self-pay | Admitting: *Deleted

## 2017-11-10 MED ORDER — FAMOTIDINE 20 MG PO TABS: 20.0000 mg | ORAL_TABLET | Freq: Two times a day (BID) | ORAL | 6 refills | Status: DC

## 2017-12-16 IMAGING — DX DG CHEST 1V PORT
1 series · 1 of 1 positions shown · non-contrast
Comparison: None.

CLINICAL DATA: 19-year-old with 2 week history of shortness of
breath and 1-1/2 week history of bilateral lower extremity edema.
Recent diagnosis of bronchitis.

EXAM:
PORTABLE CHEST 1 VIEW

[chest ap]
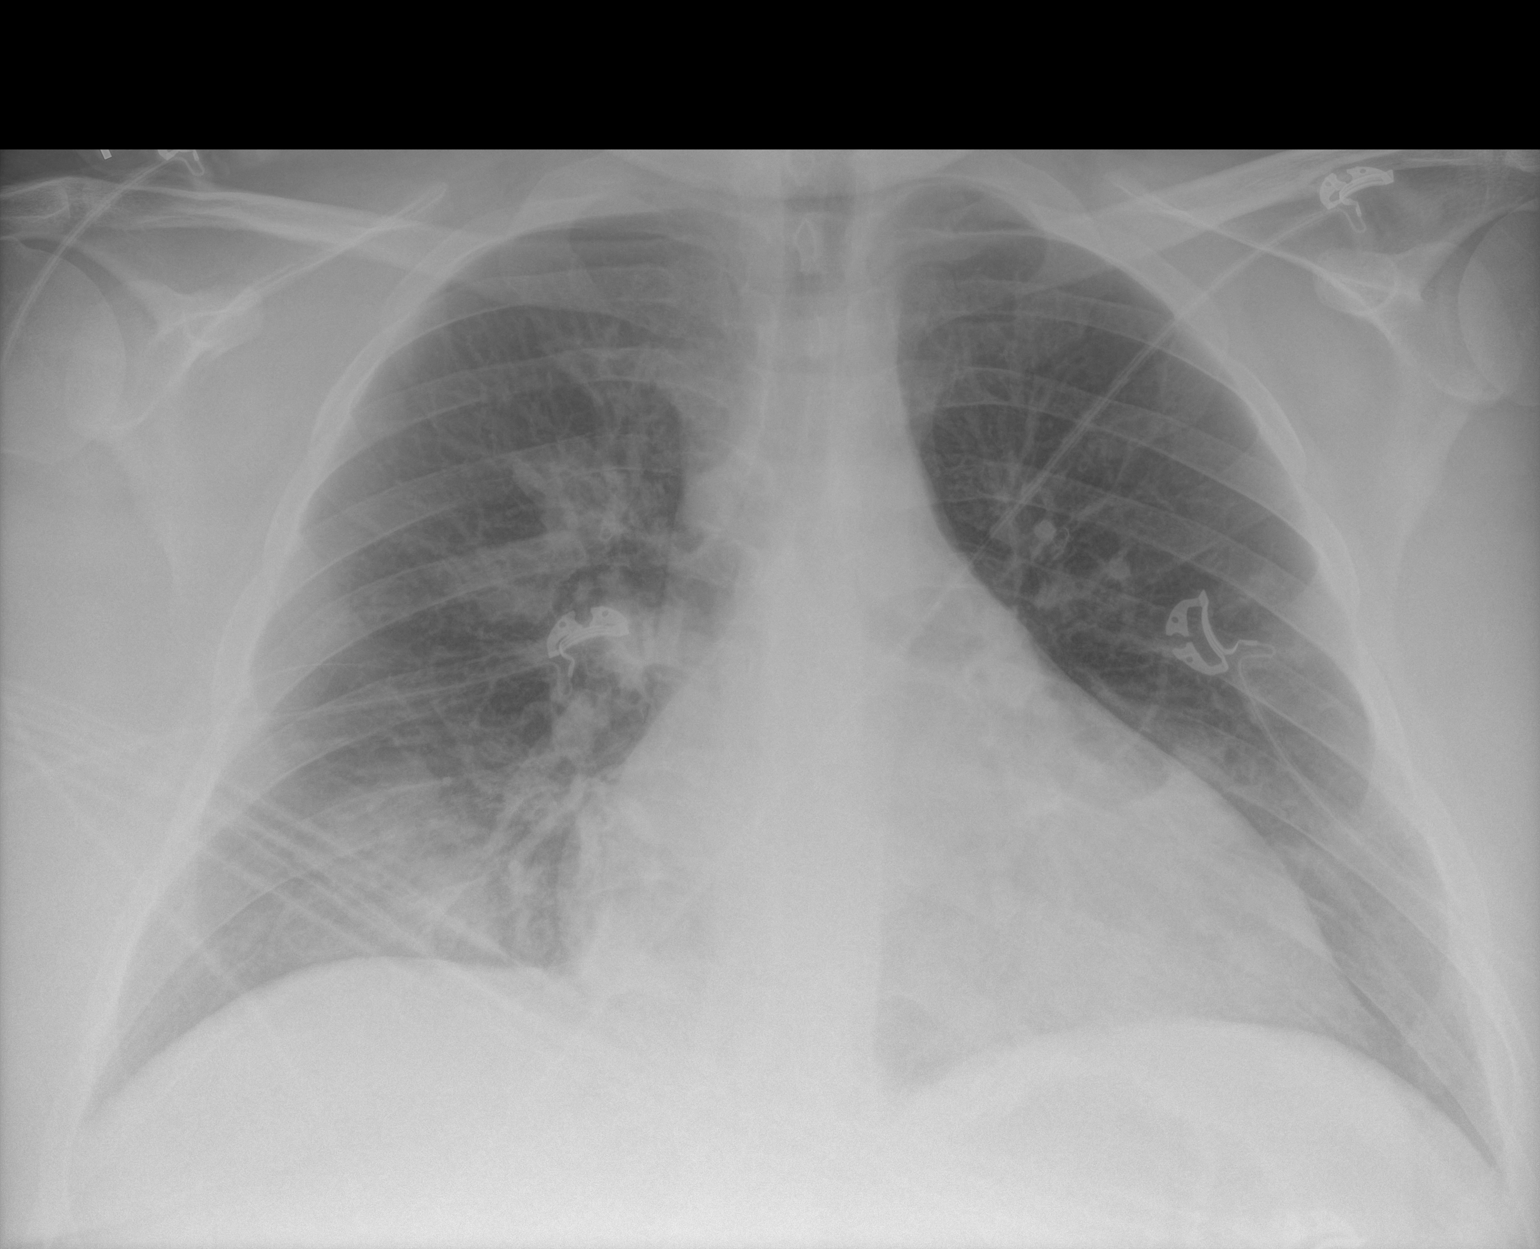

[1 of 1 positions shown; findings below may reference images not displayed]

FINDINGS: Cardiac silhouette upper normal in size to slightly enlarged even
allowing for AP portable technique. Airspace consolidation in the
right upper lobe, right lower lobe, and lingula. No pleural
effusions. Normal pulmonary vascularity.
IMPRESSION: 1. Multilobar pneumonia involving the right upper lobe, right lower
lobe and lingula.
2. Mild cardiomegaly.

## 2017-12-17 IMAGING — CT CT CHEST W/O CM
2 of 4 series · 15 of 36 positions shown, 18 images · non-contrast
Comparison: Chest radiograph performed 09/14/2015

CLINICAL DATA: Subacute onset of cough. Sepsis, and body rash.
Initial encounter.

EXAM:
CT CHEST WITHOUT CONTRAST
TECHNIQUE: Multidetector CT imaging of the chest was performed following the
standard protocol without IV contrast.

[Series 4: chest w/o 3mm st cor · coronal · non-contrast · 0.70mm/px · 3 of 100 slices shown]
[im 20/100  lung]
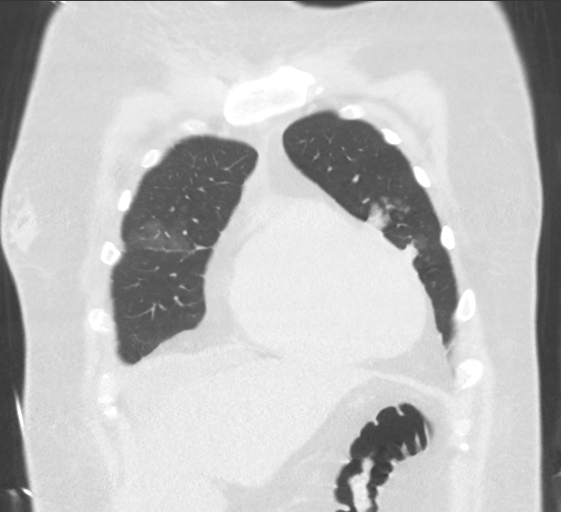
[im 40/100  lung]
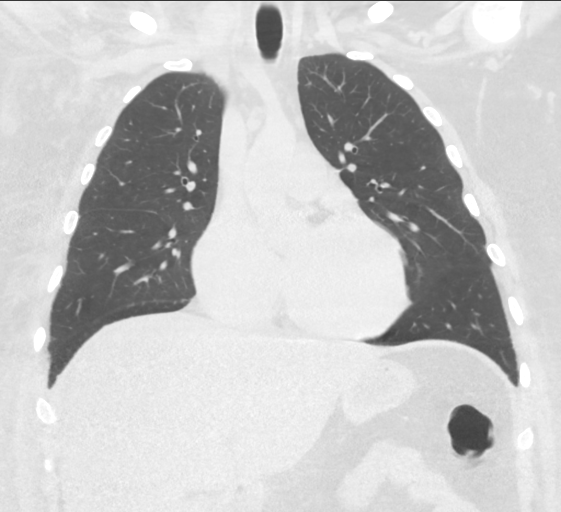
[im 60/100  lung]
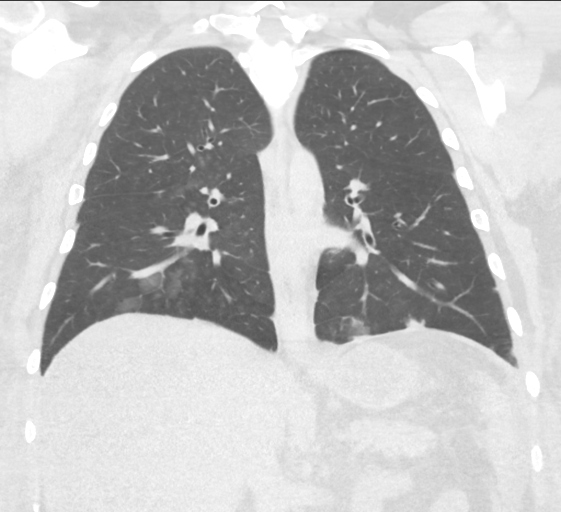

[Series 6: chest w/o 1mm st · axial · non-contrast · 0.87mm/px · z∈[-360,-35]mm · 12 of 446 slices shown, 15 images]
[im 20/446  mediastinal]
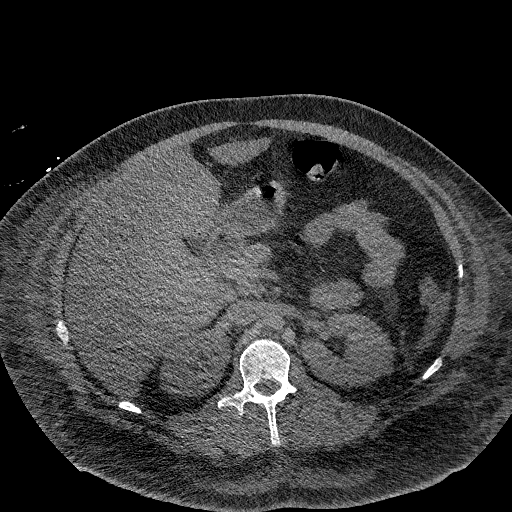
[im 20/446  lung]
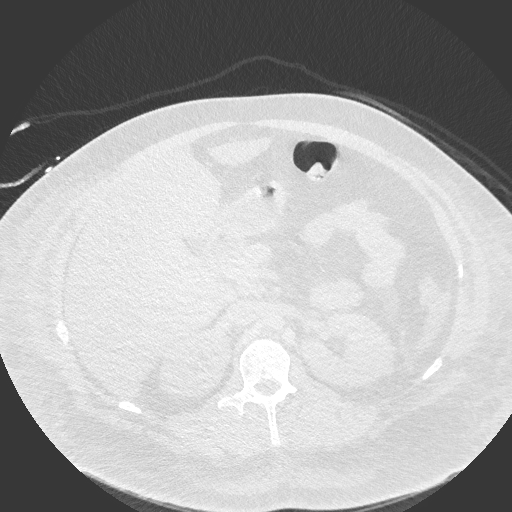
[im 59/446  lung]
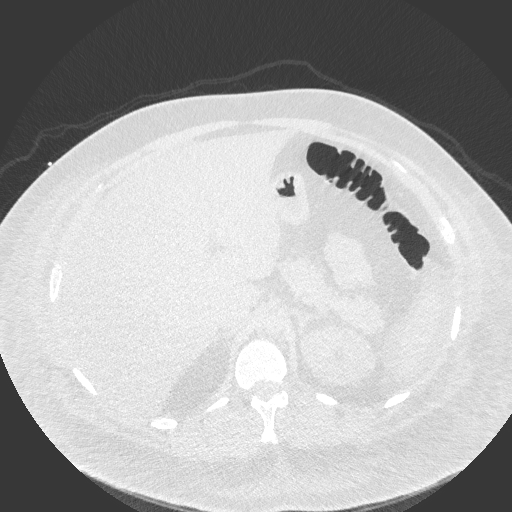
[im 97/446  lung]
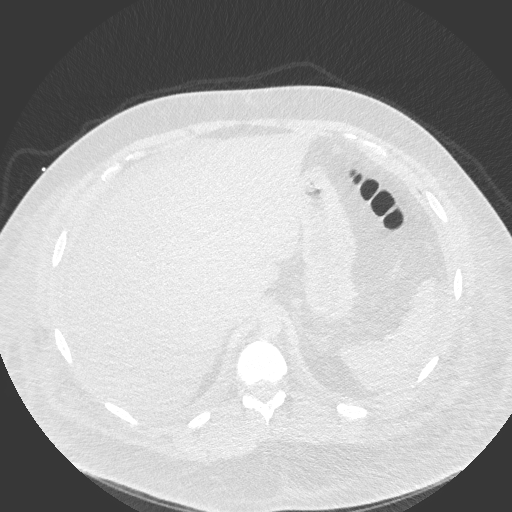
[im 136/446  lung]
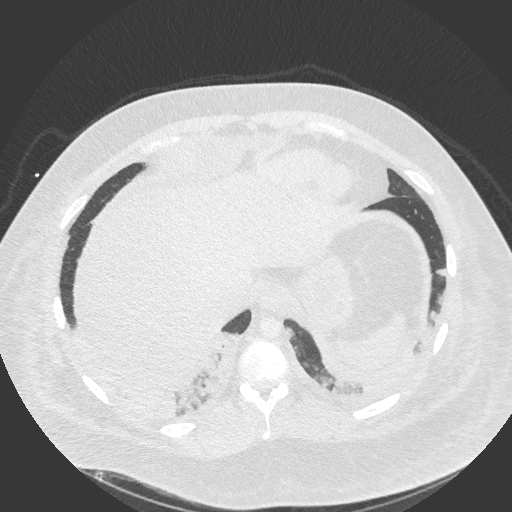
[im 175/446  mediastinal]
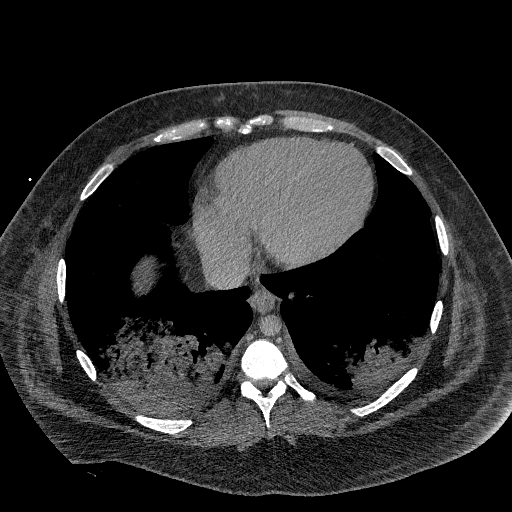
[im 175/446  lung]
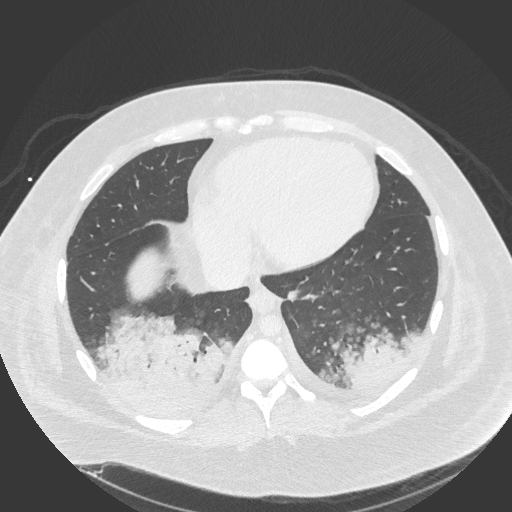
[im 213/446  lung]
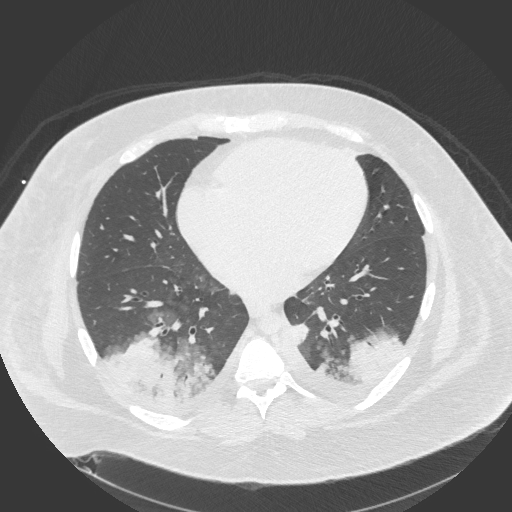
[im 233/446  lung]
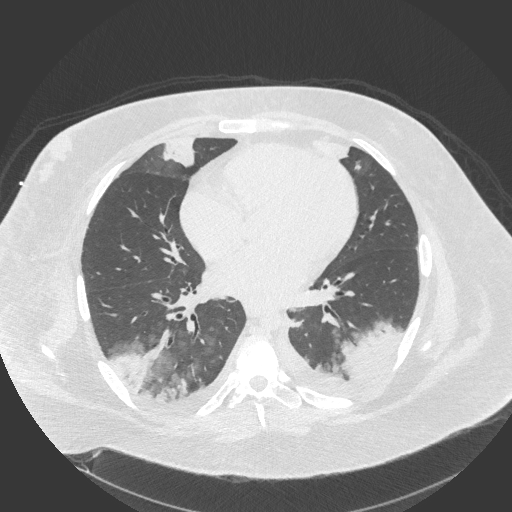
[im 271/446  lung]
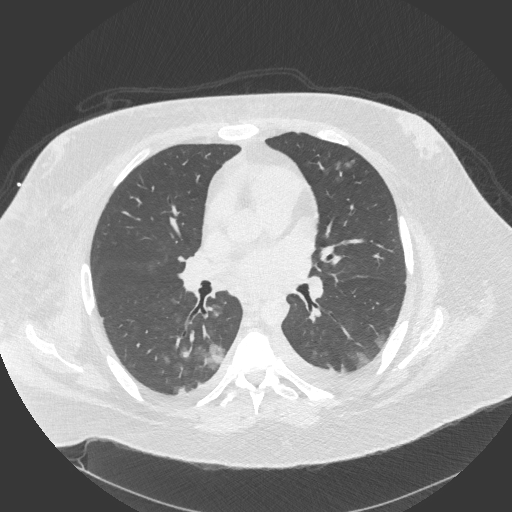
[im 310/446  mediastinal]
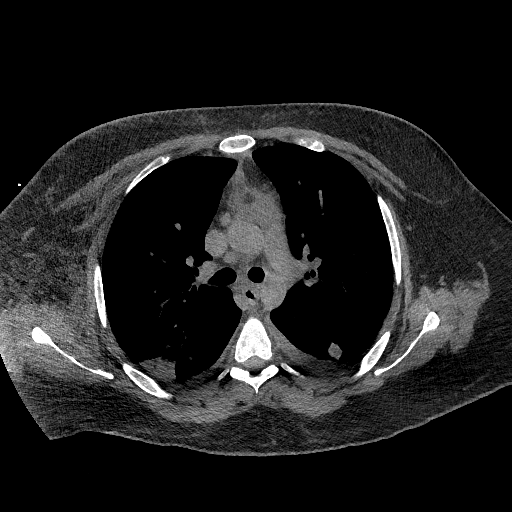
[im 310/446  lung]
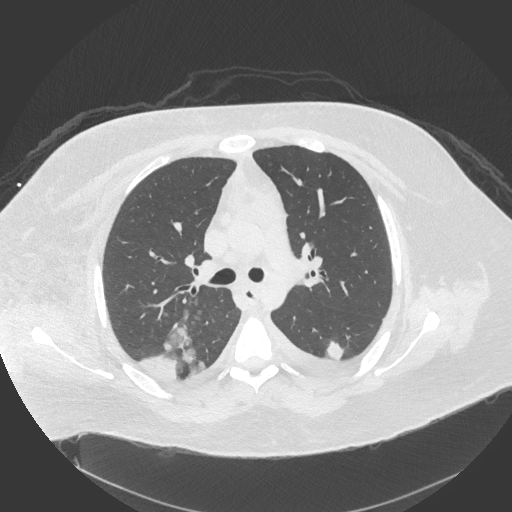
[im 349/446  lung]
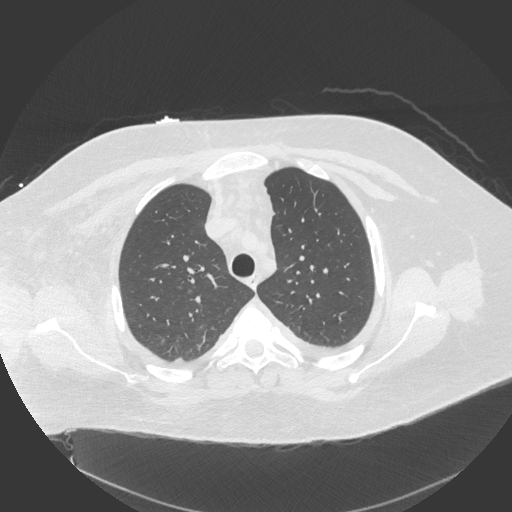
[im 387/446  lung]
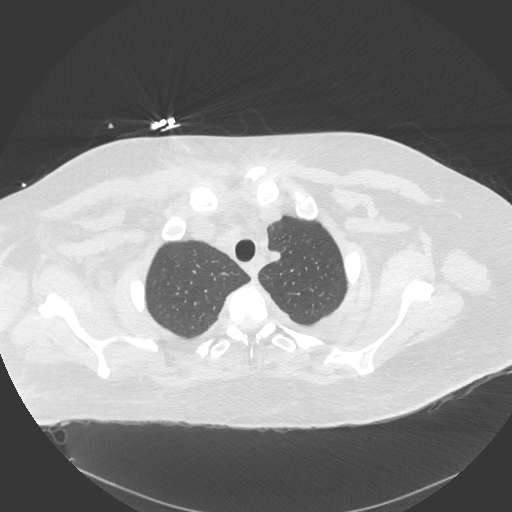
[im 426/446  lung]
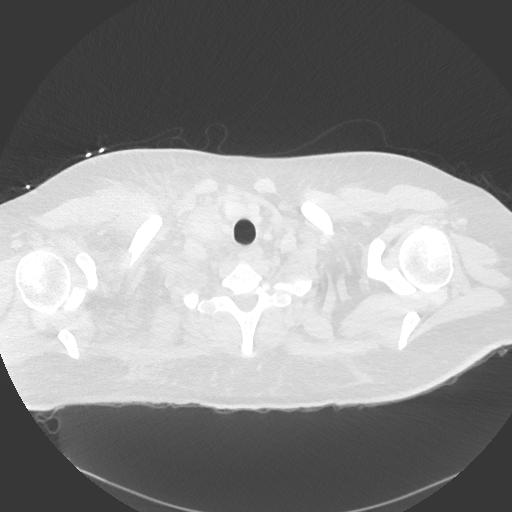

[15 of 36 positions shown; findings below may reference images not displayed]

FINDINGS: Fluffy bilateral airspace opacification is noted, most prominent at
the lower lung lobes. Trace bilateral pleural effusions are seen.
Findings are compatible with bilateral pneumonia. Underlying septic
emboli cannot be excluded, given the peripheral distribution. No
dominant mass is seen. No pneumothorax is identified.

A 1.3 cm precarinal node is noted. No additional mediastinal
lymphadenopathy is seen. No pericardial effusion is identified.
Residual thymic tissue is grossly unremarkable. The visualized
portions of thyroid gland are unremarkable. No axillary
lymphadenopathy is seen.

Vague soft tissue inflammation is noted tracking along the central
chest wall, of uncertain significance. Diffuse soft tissue swelling
is also noted tracking along the right axilla and right lateral
chest wall. Mild underlying soft tissue edema is noted about the
upper abdomen.

Mild bilateral gynecomastia is noted.

Trace ascites is noted surrounding the liver and spleen. The
visualized portions of the liver and spleen are grossly
unremarkable. The visualized portions of the pancreas, adrenal
glands and kidneys are within normal limits.

No acute osseous abnormalities are identified.
IMPRESSION: 1. Fluffy bilateral airspace opacification, most prominent at the
lower lung lobes. Trace bilateral pleural effusions seen. Findings
compatible with bilateral pneumonia.
2. Cannot exclude underlying septic emboli, given the peripheral
distribution of airspace opacities.
3. 1.3 cm precarinal node may reflect the underlying infection. Four
diffuse soft tissue swelling along the right axilla and right
lateral chest wall, with vague soft tissue inflammation along the
central chest wall. Mild soft tissue edema about the upper abdomen.
4. Trace ascites noted about the liver and spleen.
5. Mild bilateral gynecomastia noted.

## 2017-12-18 IMAGING — CR DG CHEST 1V PORT
1 series · 1 of 1 positions shown · non-contrast
Comparison: September 15, 2015

CLINICAL DATA: Acute respiratory failure

EXAM:
PORTABLE CHEST 1 VIEW

[AP]
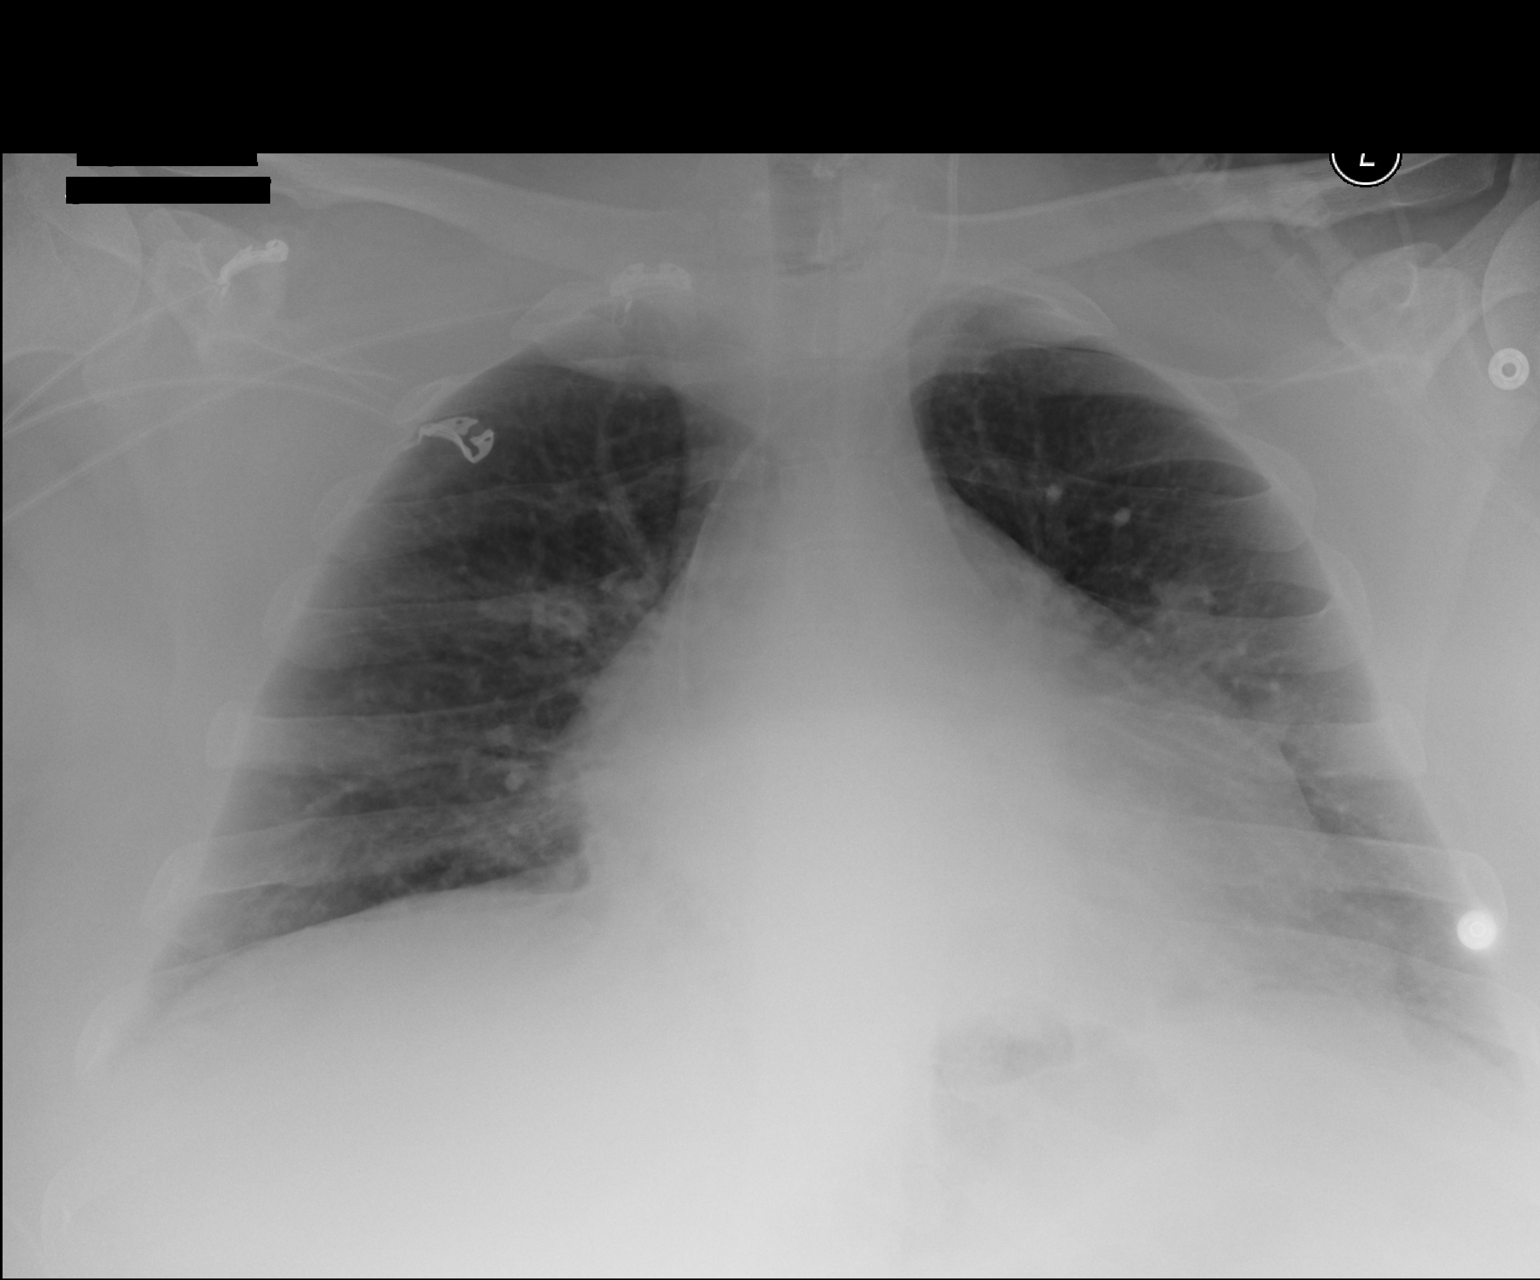

[1 of 1 positions shown; findings below may reference images not displayed]

FINDINGS: Central catheter tip is in the superior vena cava near the
cavoatrial junction, stable. No pneumothorax. There is mild
atelectasis in the left perihilar region. There is persistent patchy
consolidation in the left base. Right lung base now clear. Lungs
elsewhere clear. Heart is mildly enlarged with pulmonary vascularity
within normal limits. No adenopathy evident.
IMPRESSION: Interval clearing of patchy opacity right base. Patchy infiltrate
left base stable. Atelectasis left perihilar region. No change in
cardiac silhouette. No change in central catheter placement. No
pneumothorax peer

## 2018-02-06 ENCOUNTER — Other Ambulatory Visit (HOSPITAL_COMMUNITY): Payer: Self-pay | Admitting: *Deleted

## 2018-02-06 DIAGNOSIS — I5022 Chronic systolic (congestive) heart failure: Secondary | ICD-10-CM

## 2018-02-06 MED ORDER — CITALOPRAM HYDROBROMIDE 20 MG PO TABS: 20.0000 mg | ORAL_TABLET | Freq: Every day | ORAL | 6 refills | Status: DC

## 2018-02-06 MED ORDER — CARVEDILOL 25 MG PO TABS: 25.0000 mg | ORAL_TABLET | Freq: Two times a day (BID) | ORAL | 3 refills | Status: DC

## 2018-02-06 MED ORDER — FAMOTIDINE 20 MG PO TABS: 20.0000 mg | ORAL_TABLET | Freq: Two times a day (BID) | ORAL | 3 refills | Status: DC

## 2018-02-06 MED ORDER — SPIRONOLACTONE 25 MG PO TABS: 25.0000 mg | ORAL_TABLET | Freq: Every day | ORAL | 3 refills | Status: DC

## 2018-02-06 MED ORDER — SACUBITRIL-VALSARTAN 97-103 MG PO TABS: 1.0000 | ORAL_TABLET | Freq: Two times a day (BID) | ORAL | 3 refills | Status: DC

## 2018-02-06 MED ORDER — ASPIRIN EC 81 MG PO TBEC: 81.0000 mg | DELAYED_RELEASE_TABLET | Freq: Every day | ORAL | 3 refills | Status: AC

## 2018-02-06 MED ORDER — TORSEMIDE 20 MG PO TABS: 20.0000 mg | ORAL_TABLET | Freq: Every day | ORAL | 3 refills | Status: DC

## 2018-02-06 MED ORDER — POTASSIUM CHLORIDE CRYS ER 20 MEQ PO TBCR: 40.0000 meq | EXTENDED_RELEASE_TABLET | Freq: Every day | ORAL | 3 refills | Status: DC

## 2018-02-06 MED ORDER — MAGNESIUM OXIDE -MG SUPPLEMENT 400 (240 MG) MG PO TABS: 1.0000 | ORAL_TABLET | Freq: Two times a day (BID) | ORAL | 3 refills | Status: AC

## 2018-02-07 ENCOUNTER — Encounter (HOSPITAL_COMMUNITY): Payer: BLUE CROSS/BLUE SHIELD | Admitting: Cardiology

## 2018-02-07 ENCOUNTER — Ambulatory Visit (HOSPITAL_COMMUNITY): Payer: BLUE CROSS/BLUE SHIELD

## 2018-03-28 ENCOUNTER — Telehealth (HOSPITAL_COMMUNITY): Payer: Self-pay | Admitting: *Deleted

## 2018-03-28 NOTE — Telephone Encounter (Signed)
Pt got a job at [employer] and needs a health form completed.  Dr Shirlee Latch completed the heart, lungs, lifting/carrying sections and form was faxed back to [phone], pt aware

## 2018-04-20 ENCOUNTER — Ambulatory Visit (HOSPITAL_BASED_OUTPATIENT_CLINIC_OR_DEPARTMENT_OTHER)
Admission: RE | Admit: 2018-04-20 | Discharge: 2018-04-20 | Disposition: A | Discharge status: Home / Self Care | Payer: BLUE CROSS/BLUE SHIELD | Source: Ambulatory Visit | Attending: Cardiology | Admitting: Cardiology

## 2018-04-20 ENCOUNTER — Ambulatory Visit (HOSPITAL_COMMUNITY)
Admission: RE | Admit: 2018-04-20 | Discharge: 2018-04-20 | Disposition: A | Discharge status: Home / Self Care | Payer: BLUE CROSS/BLUE SHIELD | Source: Ambulatory Visit | Attending: Cardiology | Admitting: Cardiology

## 2018-04-20 VITALS — BP 132/70 | HR 79 | Wt 322.4 lb

## 2018-04-20 DIAGNOSIS — Z79899 Other long term (current) drug therapy: Secondary | ICD-10-CM | POA: Diagnosis not present

## 2018-04-20 DIAGNOSIS — I429 Cardiomyopathy, unspecified: Secondary | ICD-10-CM | POA: Diagnosis not present

## 2018-04-20 DIAGNOSIS — E669 Obesity, unspecified: Secondary | ICD-10-CM | POA: Insufficient documentation

## 2018-04-20 DIAGNOSIS — I5022 Chronic systolic (congestive) heart failure: Secondary | ICD-10-CM

## 2018-04-20 DIAGNOSIS — I420 Dilated cardiomyopathy: Secondary | ICD-10-CM

## 2018-04-20 DIAGNOSIS — I82621 Acute embolism and thrombosis of deep veins of right upper extremity: Secondary | ICD-10-CM | POA: Diagnosis not present

## 2018-04-20 DIAGNOSIS — Z7982 Long term (current) use of aspirin: Secondary | ICD-10-CM | POA: Insufficient documentation

## 2018-04-20 DIAGNOSIS — F329 Major depressive disorder, single episode, unspecified: Secondary | ICD-10-CM | POA: Diagnosis not present

## 2018-04-20 LAB — BASIC METABOLIC PANEL
Anion gap: 13 (ref 5–15)
BUN: 11 mg/dL (ref 6–20)
CALCIUM: 9.2 mg/dL (ref 8.9–10.3)
CO2: 23 mmol/L (ref 22–32)
CREATININE: 0.91 mg/dL (ref 0.61–1.24)
Chloride: 101 mmol/L (ref 98–111)
GFR calc Af Amer: >60 mL/min (ref >60)
Glucose, Bld: 66 mg/dL — ABNORMAL LOW (ref 70–99)
Potassium: 4.6 mmol/L (ref 3.5–5.1)
SODIUM: 137 mmol/L (ref 135–145)

## 2018-04-20 LAB — CBC
HCT: 41.2 % (ref 39.0–52.0)
Hemoglobin: 13.1 g/dL (ref 13.0–17.0)
MCH: 29.6 pg (ref 26.0–34.0)
MCHC: 31.8 g/dL (ref 30.0–36.0)
MCV: 93.2 fL (ref 78.0–100.0)
PLATELETS: 302 10*3/uL (ref 150–400)
RBC: 4.42 MIL/uL (ref 4.22–5.81)
RDW: 13.1 % (ref 11.5–15.5)
WBC: 6.6 10*3/uL (ref 4.0–10.5)

## 2018-04-20 MED ORDER — POTASSIUM CHLORIDE CRYS ER 20 MEQ PO TBCR: 20.0000 meq | EXTENDED_RELEASE_TABLET | Freq: Every day | ORAL | 3 refills | Status: DC

## 2018-04-20 MED ORDER — TORSEMIDE 20 MG PO TABS: 10.0000 mg | ORAL_TABLET | Freq: Every day | ORAL | 3 refills | Status: DC

## 2018-04-20 NOTE — Progress Notes (Signed)
  Echocardiogram 2D Echocardiogram has been performed.  Celene Skeen 04/20/2018, 3:11 PM

## 2018-04-20 NOTE — Patient Instructions (Signed)
Decrease Torsemide to 10 mg (1/2 tab) daily  Decrease Potassium to 20 meq dailly  Labs in 3 months  We will contact you in 6 months to schedule your next appointment.

## 2018-04-20 NOTE — Progress Notes (Signed)
22g IV inserted Left lateral wrist per Dr. Shirlee Latch for patient to have definity for Echocardiogram.  Test complete and IV removed.

## 2018-04-23 NOTE — Progress Notes (Signed)
Patient ID: James Bennett, male   DOB: [birth year], 22 y.o.   MRN: [id number]  Cardiology: Dr Shirlee Latch   HPI:  James Bennett is a 22 y.o. male with no medical history until hospital admit February 20th, 2017  for acute dyspnea . He was admitted with acute respiratory failure complicated by cardiogenic shock, ischemic toes and RUE DVT. Due to low output he had ischemic toes. Vascular evaluated. He did not require surgical interventio. EF 15% by echo with mildly dilated and mildly dysfunctional RV. Low output HF with marked volume overload initially. Was on milrinone but able to titrate off initially. RHC on 2/23 showed good cardiac output off milrinone but still very volume overloaded. Milrinone restarted 2/25 for recurrent profound shock. Concern for viral myocarditis, has had viral-type symptoms for several weeks and has had antibiotic courses at home prior to admission. Coxsackie Antibody A + so suspect viral myocarditis (IgG rather than IgM, so not totally sure how recent exposure was). cMRI with EF 29%, poor contrast study so not able to comment on infiltrative disease. Failed milrinone wean x 2, went home on IV milrinone.  However, we were able to wean off milrinone as an outpatient.   Echo in 6/17 showed EF 35-40% with normal diastolic function and normal RV. Repeat echo in 12/17 showed EF up to 45% with diffuse hypokinesis.  Repeat echo was done 6/18, EF up to 45-50%.   Echo was done today and reviewed, EF up to 55%.   He presents today for followup of CHF.  Weight is up 9 lbs since last appointment.  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  He walks up to 3-5 miles/day on the job.  No significant dyspnea or chest pain. No lightheadedness.    Labs (3/17): HCT 30.6, K 3.8, creatinine 0.74, co-ox 56%.  Labs (4/17): K 4.5, creatinine 0.71 => 0.73, BNP 174, digoxin 0.9 Labs (5/17): K 4.6, creatinine 0.79, digoxin 1.0, HCT 38 Labs (7/17):  K 3.9, creatinine 0.65, digoxin 0.5 Labs (9/17): digoxin 0.6, K 4.1, creatinine 0.69, BNP 23 Labs (12/17): K 3.8, creatinine 0.65 Labs (3/18): K 4, creatinine 0.68, BNP 20.5 Labs (10/18) : K 4.4, creatinine 0.76 Labs (12/18): K 4, creatinine 0.73  PMH: 1. Nonischemic cardiomyopathy: 2/17 admitted with cardiogenic shock.  Suspected acute myocarditis, coxsackievirus A positive.  He was started on milrinone, titrated off in 3/17.  - Echo (2/17) with EF 15%, diffuse hypokinesis, mildly decreased RV systolic function.  - Cardiac MRI (3/17) with EF 29%, moderately dilated LV, mildly decreased RV systolic function, unable to assess for late gadolinium enhancement (poor quality contrast). - RHC (2/17) with mean RA 19, PA 51/32 mean 45, PCWP mean 40, CI 2.24 Fick/2.56 thermo.  - Blood type A+. - Echo (6/17): EF 35-40%, normal diastolic function, normal RV size and systolic function, PASP 42 mmHg, GLS-16.4%. - Echo (12/17): EF 45%, diffuse hypokinesis.   - Echo (6/18): EF 45-50%, diffuse hypokinesis, normal diastolic function, normal RV size and systolic function.  - Echo (9/19): EF 55%, normal RV size and systolic function, PASP 39 mmHg 2. Ischemic toes: Suspect due to low output/cardiogenic shock.  3. RUE DVT: Now on coumadin.  4. NSVT 5. Depression  Review of systems complete and found to be negative unless listed in HPI.    SH:  Social History   Socioeconomic History  . Marital status: Single    Spouse name: Not on file  . Number of  children: Not on file  . Years of education: Not on file  . Highest education level: Not on file  Occupational History  . Not on file  Social Needs  . Financial resource strain: Not on file  . Food insecurity:    Worry: Not on file    Inability: Not on file  . Transportation needs:    Medical: Not on file    Non-medical: Not on file  Tobacco Use  . Smoking status: Never Smoker  . Smokeless tobacco: Never Used  Substance and Sexual Activity  .  Alcohol use: No  . Drug use: No  . Sexual activity: Not on file  Lifestyle  . Physical activity:    Days per week: Not on file    Minutes per session: Not on file  . Stress: Not on file  Relationships  . Social connections:    Talks on phone: Not on file    Gets together: Not on file    Attends religious service: Not on file    Active member of club or organization: Not on file    Attends meetings of clubs or organizations: Not on file    Relationship status: Not on file  . Intimate partner violence:    Fear of current or ex partner: Not on file    Emotionally abused: Not on file    Physically abused: Not on file    Forced sexual activity: Not on file  Other Topics Concern  . 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 Bird exposure through his father's workplace of a small parrot. No mold exposure. No recent travel.   FH:  Family History  Problem Relation Age of Onset  . Lupus Mother    Current Outpatient Medications  Medication Sig Dispense Refill  . aspirin EC 81 MG tablet Take 1 tablet (81 mg total) by mouth daily. 90 tablet 3  . carvedilol (COREG) 25 MG tablet Take 1 tablet (25 mg total) by mouth 2 (two) times daily with a meal. 60 tablet 3  . citalopram (CELEXA) 20 MG tablet Take 1 tablet (20 mg total) by mouth daily. 30 tablet 6  . famotidine (PEPCID) 20 MG tablet Take 1 tablet (20 mg total) by mouth 2 (two) times daily. Contact CHF clinic if you think you no longer need this medication (for reflux). 60 tablet 3  . hydrocortisone cream 1 % Apply topically every 2 (two) hours as needed for itching. 30 g 0  . Magnesium Oxide 400 (240 Mg) MG TABS Take 1 tablet (400 mg total) by mouth 2 (two) times daily. 60 tablet 3  . potassium chloride SA (K-DUR,KLOR-CON) 20 MEQ tablet Take 1 tablet (20 mEq total) by mouth daily. 60 tablet 3  . sacubitril-valsartan (ENTRESTO) 97-103 MG Take 1 tablet by mouth  2 (two) times daily. 60 tablet 3  . spironolactone (ALDACTONE) 25 MG tablet Take 1 tablet (25 mg total) by mouth daily. 30 tablet 3  . torsemide (DEMADEX) 20 MG tablet Take 0.5 tablets (10 mg total) by mouth daily. 30 tablet 3  . acetaminophen (TYLENOL) 325 MG tablet Take 2 tablets (650 mg total) by mouth every 4 (four) hours as needed for headache or mild pain.     No current facility-administered medications for this encounter.    Vitals:   04/20/18 1507  BP: 132/70  Pulse: 79  SpO2: 95%  Weight: (!) 146.2 kg (  322 lb 6.4 oz)   Wt Readings from Last 3 Encounters:  04/20/18 (!) 146.2 kg (322 lb 6.4 oz)  07/12/17 (!) 142 kg (313 lb 1.9 oz)  01/19/17 134.8 kg (297 lb 4 oz)   PHYSICAL EXAM: General: NAD, obese Neck: No JVD, no thyromegaly or thyroid nodule.  Lungs: Clear to auscultation bilaterally with normal respiratory effort. CV: Nondisplaced PMI.  Heart regular S1/S2, no S3/S4, no murmur.  No peripheral edema.  No carotid bruit.  Normal pedal pulses.  Abdomen: Soft, nontender, no hepatosplenomegaly, no distention.  Skin: Intact without lesions or rashes.  Neurologic: Alert and oriented x 3.  Psych: Normal affect. Extremities: No clubbing or cyanosis.  HEENT: Normal.   ASSESSMENT & PLAN: 1. Chronic systolic CHF:  2/17 admit with cardiogenic shock.  EF 15% by echo with mildly dilated and mildly dysfunctional RV. Was on milrinone but able to titrate off initially. RHC on 2/23 showed good cardiac output off milrinone but still very volume overloaded. Milrinone restarted 2/25 for recurrent profound shock. Concern for viral myocarditis, had had viral-type symptoms for several weeks and had had antibiotic courses at home prior to admission. Coxsackie Antibody A + so suspect viral myocarditis (IgG rather than IgM, so not totally sure how recent exposure was). cMRI with EF 29%, poor contrast study so not able to comment on infiltrative disease. Failed milrinone wean x 2 in hospital but  weaned off at home. Echo 12/17 with EF up to 45%, diffuse hypokinesis.  Echo 12/2016 EF 45-50%. Echo was done today and reviewed, EF up to 55%. NYHA class II symptoms.  Not volume overloaded on exam.    - Continue Coreg 25 mg BID - Decrease torsemide to 10 mg daily and KCl to 20 mEq daily.   - Continue current spironolactone.  - Continue Entresto 97/103 bid.    - EF out of ICD range.  2. Ischemic toes: Resolved.    3. RUE DVT:  Confirmed on Ultrasound 2/27. Off coumadin in 8/17, now on ASA 81 daily.   4. Depression: Continue Celexa 20 mg daily.  5. Obesity:  Weight continues to trend up. Do not think that he is volume overloaded.  - Discussed diet/exercise for weight loss.   Marca Ancona 04/23/2018

## 2018-05-04 MED ORDER — PERFLUTREN LIPID MICROSPHERE
1.0000 mL | INTRAVENOUS | Status: AC | PRN
  Administered 2018-04-20: 3 mL via INTRAVENOUS

## 2018-06-15 ENCOUNTER — Other Ambulatory Visit: Payer: Self-pay | Admitting: *Deleted

## 2018-06-15 DIAGNOSIS — I5022 Chronic systolic (congestive) heart failure: Secondary | ICD-10-CM

## 2018-06-15 MED ORDER — CARVEDILOL 25 MG PO TABS: 25.0000 mg | ORAL_TABLET | Freq: Two times a day (BID) | ORAL | 3 refills | Status: DC

## 2018-06-19 ENCOUNTER — Other Ambulatory Visit (HOSPITAL_COMMUNITY): Payer: Self-pay

## 2018-06-19 MED ORDER — TORSEMIDE 20 MG PO TABS: 10.0000 mg | ORAL_TABLET | Freq: Every day | ORAL | 3 refills | Status: DC

## 2018-07-09 ENCOUNTER — Other Ambulatory Visit (HOSPITAL_COMMUNITY): Payer: Self-pay | Admitting: Cardiology

## 2018-07-20 ENCOUNTER — Ambulatory Visit (HOSPITAL_COMMUNITY)
Admission: RE | Admit: 2018-07-20 | Discharge: 2018-07-20 | Disposition: A | Discharge status: Home / Self Care | Payer: BC Managed Care – PPO | Source: Ambulatory Visit | Attending: Internal Medicine | Admitting: Internal Medicine

## 2018-07-20 DIAGNOSIS — I5022 Chronic systolic (congestive) heart failure: Secondary | ICD-10-CM | POA: Diagnosis present

## 2018-07-20 LAB — BASIC METABOLIC PANEL
Anion gap: 10 (ref 5–15)
BUN: 10 mg/dL (ref 6–20)
CHLORIDE: 103 mmol/L (ref 98–111)
CO2: 24 mmol/L (ref 22–32)
Calcium: 10 mg/dL (ref 8.9–10.3)
Creatinine, Ser: 0.8 mg/dL (ref 0.61–1.24)
GFR calc Af Amer: >60 mL/min (ref >60)
Glucose, Bld: 98 mg/dL (ref 70–99)
Potassium: 4.2 mmol/L (ref 3.5–5.1)
SODIUM: 137 mmol/L (ref 135–145)

## 2018-08-10 ENCOUNTER — Telehealth (HOSPITAL_COMMUNITY): Payer: Self-pay

## 2018-08-10 NOTE — Telephone Encounter (Signed)
Prior authorization through [employer] company was initiated for [employer] 97-103 medication and sent via CMM on 08/10/2018.

## 2018-08-31 ENCOUNTER — Telehealth (HOSPITAL_COMMUNITY): Payer: Self-pay

## 2018-08-31 NOTE — Telephone Encounter (Signed)
Prior authorization through [facility] [employer] company was APPROVED for Sherryll Burger and will expire on 08/11/2019.

## 2018-09-11 ENCOUNTER — Other Ambulatory Visit (HOSPITAL_COMMUNITY): Payer: Self-pay | Admitting: Cardiology

## 2018-09-11 NOTE — Telephone Encounter (Signed)
refills for this med should be sent to PCP 

## 2018-10-01 ENCOUNTER — Other Ambulatory Visit (HOSPITAL_COMMUNITY): Payer: Self-pay | Admitting: Cardiology

## 2018-11-05 ENCOUNTER — Other Ambulatory Visit (HOSPITAL_COMMUNITY): Payer: Self-pay | Admitting: Cardiology

## 2018-11-05 DIAGNOSIS — I5022 Chronic systolic (congestive) heart failure: Secondary | ICD-10-CM

## 2018-12-11 ENCOUNTER — Other Ambulatory Visit (HOSPITAL_COMMUNITY): Payer: Self-pay | Admitting: Cardiology

## 2019-01-02 ENCOUNTER — Other Ambulatory Visit (HOSPITAL_COMMUNITY): Payer: Self-pay | Admitting: Cardiology

## 2019-01-09 ENCOUNTER — Other Ambulatory Visit (HOSPITAL_COMMUNITY): Payer: Self-pay | Admitting: Cardiology

## 2019-02-06 ENCOUNTER — Other Ambulatory Visit (HOSPITAL_COMMUNITY): Payer: Self-pay | Admitting: Cardiology

## 2019-03-07 ENCOUNTER — Other Ambulatory Visit (HOSPITAL_COMMUNITY): Payer: Self-pay | Admitting: Cardiology

## 2019-03-07 DIAGNOSIS — I5022 Chronic systolic (congestive) heart failure: Secondary | ICD-10-CM

## 2019-04-02 ENCOUNTER — Other Ambulatory Visit (HOSPITAL_COMMUNITY): Payer: Self-pay | Admitting: Cardiology

## 2019-04-12 ENCOUNTER — Other Ambulatory Visit (HOSPITAL_COMMUNITY): Payer: Self-pay | Admitting: Cardiology

## 2019-05-14 ENCOUNTER — Other Ambulatory Visit (HOSPITAL_COMMUNITY): Payer: Self-pay | Admitting: Cardiology

## 2019-06-12 ENCOUNTER — Other Ambulatory Visit (HOSPITAL_COMMUNITY): Payer: Self-pay | Admitting: Cardiology

## 2019-06-24 ENCOUNTER — Other Ambulatory Visit (HOSPITAL_COMMUNITY): Payer: Self-pay | Admitting: Cardiology

## 2019-07-10 ENCOUNTER — Other Ambulatory Visit (HOSPITAL_COMMUNITY): Payer: Self-pay | Admitting: Cardiology

## 2019-07-10 DIAGNOSIS — I5022 Chronic systolic (congestive) heart failure: Secondary | ICD-10-CM

## 2019-08-08 ENCOUNTER — Other Ambulatory Visit (HOSPITAL_COMMUNITY): Payer: Self-pay | Admitting: Cardiology

## 2019-08-12 ENCOUNTER — Other Ambulatory Visit (HOSPITAL_COMMUNITY): Payer: Self-pay

## 2019-09-30 ENCOUNTER — Other Ambulatory Visit (HOSPITAL_COMMUNITY): Payer: Self-pay | Admitting: Cardiology

## 2019-10-01 ENCOUNTER — Other Ambulatory Visit (HOSPITAL_COMMUNITY): Payer: Self-pay | Admitting: Cardiology

## 2019-10-28 ENCOUNTER — Other Ambulatory Visit (HOSPITAL_COMMUNITY): Payer: Self-pay | Admitting: Cardiology

## 2019-11-12 ENCOUNTER — Other Ambulatory Visit: Payer: Self-pay

## 2019-11-12 ENCOUNTER — Ambulatory Visit (HOSPITAL_COMMUNITY)
Admission: RE | Admit: 2019-11-12 | Discharge: 2019-11-12 | Disposition: A | Discharge status: Home / Self Care | Payer: BC Managed Care – PPO | Source: Ambulatory Visit | Attending: Cardiology | Admitting: Cardiology

## 2019-11-12 ENCOUNTER — Encounter (HOSPITAL_COMMUNITY): Payer: Self-pay | Admitting: Cardiology

## 2019-11-12 VITALS — BP 150/110 | HR 85 | Wt 322.4 lb

## 2019-11-12 DIAGNOSIS — F329 Major depressive disorder, single episode, unspecified: Secondary | ICD-10-CM | POA: Diagnosis not present

## 2019-11-12 DIAGNOSIS — Z7982 Long term (current) use of aspirin: Secondary | ICD-10-CM | POA: Insufficient documentation

## 2019-11-12 DIAGNOSIS — Z79899 Other long term (current) drug therapy: Secondary | ICD-10-CM | POA: Diagnosis not present

## 2019-11-12 DIAGNOSIS — I5022 Chronic systolic (congestive) heart failure: Secondary | ICD-10-CM | POA: Diagnosis not present

## 2019-11-12 DIAGNOSIS — I110 Hypertensive heart disease with heart failure: Secondary | ICD-10-CM | POA: Insufficient documentation

## 2019-11-12 DIAGNOSIS — Z86718 Personal history of other venous thrombosis and embolism: Secondary | ICD-10-CM | POA: Diagnosis not present

## 2019-11-12 DIAGNOSIS — E669 Obesity, unspecified: Secondary | ICD-10-CM | POA: Diagnosis not present

## 2019-11-12 LAB — BASIC METABOLIC PANEL
Anion gap: 11 (ref 5–15)
BUN: 9 mg/dL (ref 6–20)
CO2: 23 mmol/L (ref 22–32)
Calcium: 9.8 mg/dL (ref 8.9–10.3)
Chloride: 104 mmol/L (ref 98–111)
Creatinine, Ser: 0.72 mg/dL (ref 0.61–1.24)
GFR calc Af Amer: >60 mL/min (ref >60)
GFR calc non Af Amer: >60 mL/min (ref >60)
Glucose, Bld: 93 mg/dL (ref 70–99)
Potassium: 4 mmol/L (ref 3.5–5.1)
Sodium: 138 mmol/L (ref 135–145)

## 2019-11-12 LAB — CBC
HCT: 47.6 % (ref 39.0–52.0)
Hemoglobin: 15.1 g/dL (ref 13.0–17.0)
MCH: 28 pg (ref 26.0–34.0)
MCHC: 31.7 g/dL (ref 30.0–36.0)
MCV: 88.1 fL (ref 80.0–100.0)
Platelets: 329 10*3/uL (ref 150–400)
RBC: 5.4 MIL/uL (ref 4.22–5.81)
RDW: 14.5 % (ref 11.5–15.5)
WBC: 6.9 10*3/uL (ref 4.0–10.5)
nRBC: 0 % (ref 0.0–0.2)

## 2019-11-12 MED ORDER — AMLODIPINE BESYLATE 5 MG PO TABS: 5.0000 mg | ORAL_TABLET | Freq: Every day | ORAL | 3 refills | Status: DC

## 2019-11-12 NOTE — Patient Instructions (Signed)
START Amlodipine 5mg  (1 tab) daily   Labs today and repeat every 3 months until 1 year return to office.  We will only contact you if something comes back abnormal or we need to make some changes. Otherwise no news is good news!   Your physician has requested that you have an echocardiogram. Echocardiography is a painless test that uses sound waves to create images of your heart. It provides your doctor with information about the size and shape of your heart and how well your heart's chambers and valves are working. This procedure takes approximately one hour. There are no restrictions for this procedure.   Your physician wants you to follow-up in: 1 year.  You will receive a reminder letter in the mail two months in advance. If you don't receive a letter, please call our office to schedule the follow-up appointment.   At [facility], you and your health needs are our priority. As part of our continuing mission to provide you with exceptional heart care, we have created designated Provider Care Teams. These Care Teams include your primary Cardiologist (physician) and Advanced Practice Providers (APPs- Physician Assistants and Nurse Practitioners) who all work together to provide you with the care you need, when you need it.   You may see any of the following providers on your designated Care Team at your next follow up: Dr [name] . Dr Arvilla Meres . Marca Ancona, NP . Tonye Becket, PA . Robbie Lis, PharmD   Please be sure to bring in all your medications bottles to every appointment.

## 2019-11-13 NOTE — Progress Notes (Signed)
Patient ID: James Bennett, male   DOB: [birth year], 24 y.o.   MRN: [id number]  Cardiology: Dr Shirlee Latch   HPI:  James Bennett is a 24 y.o. male with no medical history until hospital admit February 20th, 2017  for acute dyspnea . He was admitted with acute respiratory failure complicated by cardiogenic shock, ischemic toes and RUE DVT. Due to low output he had ischemic toes. Vascular evaluated. He did not require surgical interventio. EF 15% by echo with mildly dilated and mildly dysfunctional RV. Low output HF with marked volume overload initially. Was on milrinone but able to titrate off initially. RHC on 2/23 showed good cardiac output off milrinone but still very volume overloaded. Milrinone restarted 2/25 for recurrent profound shock. Concern for viral myocarditis, has had viral-type symptoms for several weeks and has had antibiotic courses at home prior to admission. Coxsackie Antibody A + so suspect viral myocarditis (IgG rather than IgM, so not totally sure how recent exposure was). cMRI with EF 29%, poor contrast study so not able to comment on infiltrative disease. Failed milrinone wean x 2, went home on IV milrinone.  However, we were able to wean off milrinone as an outpatient.   Echo in 6/17 showed EF 35-40% with normal diastolic function and normal RV. Repeat echo in 12/17 showed EF up to 45% with diffuse hypokinesis.  Repeat echo was done 6/18, EF up to 45-50%.   Echo in 9/19 showed EF up to 55%.   He presents today for followup of CHF.  Weight is stable since last appointment.  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  BP is high today, he has taken all his meds.  No significant exertional dyspnea.  No chest pain.  No orthopnea/PND.    Labs (3/17): HCT 30.6, K 3.8, creatinine 0.74, co-ox 56%.  Labs (4/17): K 4.5, creatinine 0.71 => 0.73, BNP 174, digoxin 0.9 Labs (5/17): K 4.6, creatinine 0.79, digoxin 1.0, HCT 38 Labs (7/17): K 3.9,  creatinine 0.65, digoxin 0.5 Labs (9/17): digoxin 0.6, K 4.1, creatinine 0.69, BNP 23 Labs (12/17): K 3.8, creatinine 0.65 Labs (3/18): K 4, creatinine 0.68, BNP 20.5 Labs (10/18) : K 4.4, creatinine 0.76 Labs (12/18): K 4, creatinine 0.73 Labs (12/19): K 4.2, creatinine 0.8  ECG (personally reviewed): NSR, normal  PMH: 1. Nonischemic cardiomyopathy: 2/17 admitted with cardiogenic shock.  Suspected acute myocarditis, coxsackievirus A positive.  He was started on milrinone, titrated off in 3/17.  - Echo (2/17) with EF 15%, diffuse hypokinesis, mildly decreased RV systolic function.  - Cardiac MRI (3/17) with EF 29%, moderately dilated LV, mildly decreased RV systolic function, unable to assess for late gadolinium enhancement (poor quality contrast). - RHC (2/17) with mean RA 19, PA 51/32 mean 45, PCWP mean 40, CI 2.24 Fick/2.56 thermo.  - Blood type A+. - Echo (6/17): EF 35-40%, normal diastolic function, normal RV size and systolic function, PASP 42 mmHg, GLS-16.4%. - Echo (12/17): EF 45%, diffuse hypokinesis.   - Echo (6/18): EF 45-50%, diffuse hypokinesis, normal diastolic function, normal RV size and systolic function.  - Echo (9/19): EF 55%, normal RV size and systolic function, PASP 39 mmHg 2. Ischemic toes: Suspect due to low output/cardiogenic shock.  3. RUE DVT: Now on coumadin.  4. NSVT 5. Depression 6. HTN  Review of systems complete and found to be negative unless listed in HPI.    SH:  Social History   Socioeconomic History  . Marital status:  Single    Spouse name: Not on file  . Number of children: Not on file  . Years of education: Not on file  . Highest education level: Not on file  Occupational History  . Not on file  Tobacco Use  . Smoking status: Never Smoker  . Smokeless tobacco: Never Used  Substance and Sexual Activity  . Alcohol use: No  . Drug use: No  . Sexual activity: Not on file  Other Topics Concern  . 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 Bird exposure through his father's workplace of a small parrot. No mold exposure. No recent travel.   Social Determinants of Health   Financial Resource Strain:   . Difficulty of Paying Living Expenses:   Food Insecurity:   . Worried About Charity fundraiser in the Last Year:   . Arboriculturist in the Last Year:   Transportation Needs:   . Film/video editor (Medical):   [name] Lack of Transportation (Non-Medical):   Physical Activity:   . Days of Exercise per Week:   . Minutes of Exercise per Session:   Stress:   . Feeling of Stress :   Social Connections:   . Frequency of Communication with Friends and Family:   . Frequency of Social Gatherings with Friends and Family:   . Attends Religious Services:   . Active Member of Clubs or Organizations:   . Attends Archivist Meetings:   [name] Marital Status:   Intimate Partner Violence:   . Fear of Current or Ex-Partner:   . Emotionally Abused:   [name] Physically Abused:   . Sexually Abused:    FH:  Family History  Problem Relation Age of Onset  . Lupus Mother    Current Outpatient Medications  Medication Sig Dispense Refill  . acetaminophen (TYLENOL) 325 MG tablet Take 2 tablets (650 mg total) by mouth every 4 (four) hours as needed for headache or mild pain.    [name] aspirin EC 81 MG tablet Take 1 tablet (81 mg total) by mouth daily. 90 tablet 3  . carvedilol (COREG) 25 MG tablet TAKE 1 TABLET BY MOUTH TWICE DAILY WITH A MEAL 60 tablet 3  . citalopram (CELEXA) 20 MG tablet TAKE 1 TABLET BY MOUTH ONCE DAILY 30 tablet 0  . ENTRESTO 97-103 MG TAKE 1 TABLET BY MOUTH TWICE DAILY 60 tablet 5  . famotidine (PEPCID) 40 MG tablet TAKE 1/2 TABLET BY MOUTH TWICE DAILY. 30 tablet 0  . Magnesium Oxide 400 (240 Mg) MG TABS Take 1 tablet (400 mg total) by mouth 2 (two) times daily. 60 tablet 3  . potassium chloride SA (K-DUR,KLOR-CON) 20 MEQ tablet Take 1  tablet (20 mEq total) by mouth daily. 60 tablet 3  . spironolactone (ALDACTONE) 25 MG tablet TAKE 1 TABLET BY MOUTH ONCE DAILY 30 tablet 5  . torsemide (DEMADEX) 10 MG tablet Take 1 tablet (10 mg total) by mouth daily. 30 tablet 0  . amLODipine (NORVASC) 5 MG tablet Take 1 tablet (5 mg total) by mouth daily. 180 tablet 3   No current facility-administered medications for this encounter.   Vitals:   11/12/19 1509  BP: (!) 150/110  Pulse: 85  SpO2: 97%  Weight: (!) 146.2 kg (322 lb 6.4 oz)   Wt Readings from Last 3 Encounters:  11/12/19 (!) 146.2 kg (322 lb 6.4 oz)  04/20/18 [name])  146.2 kg (322 lb 6.4 oz)  07/12/17 (!) 142 kg (313 lb 1.9 oz)   PHYSICAL EXAM: General: NAD, obese Neck: No JVD, no thyromegaly or thyroid nodule.  Lungs: Clear to auscultation bilaterally with normal respiratory effort. CV: Nondisplaced PMI.  Heart regular S1/S2, no S3/S4, no murmur.  No peripheral edema.  No carotid bruit.  Normal pedal pulses.  Abdomen: Soft, nontender, no hepatosplenomegaly, no distention.  Skin: Intact without lesions or rashes.  Neurologic: Alert and oriented x 3.  Psych: Normal affect. Extremities: No clubbing or cyanosis.  HEENT: Normal.   ASSESSMENT & PLAN: 1. Chronic systolic CHF:  2/17 admit with cardiogenic shock.  EF 15% by echo with mildly dilated and mildly dysfunctional RV. Was on milrinone but able to titrate off initially. RHC on 2/23 showed good cardiac output off milrinone but still very volume overloaded. Milrinone restarted 2/25 for recurrent profound shock. Concern for viral myocarditis, had had viral-type symptoms for several weeks and had had antibiotic courses at home prior to admission. Coxsackie Antibody A + so suspect viral myocarditis (IgG rather than IgM, so not totally sure how recent exposure was). cMRI with EF 29%, poor contrast study so not able to comment on infiltrative disease. Failed milrinone wean x 2 in hospital but weaned off at home. Echo 12/17  with EF up to 45%, diffuse hypokinesis.  Echo 12/2016 EF 45-50%. Echo 9/19 showed EF up to 55%. He is doing well symptomatically, NYHA class I.  No significant dyspnea.   - Continue Coreg 25 mg BID - Continue torsemide 10 mg daily.    - Continue current spironolactone.  - Continue Entresto 97/103 bid.    - EF out of ICD range.  - I will arrange for repeat echo to make sure EF remains normal.  - BMET today and every 3 months given his medication regimen.  2. Ischemic toes: Resolved.    3. RUE DVT:  Confirmed on Ultrasound 2/27. Off coumadin in 8/17, now on ASA 81 daily.   4. Depression: Continue Celexa 20 mg daily.  5. Obesity:  Still needs to work on weight loss.  6. HTN: Add amlodipine 5 mg daily.  Weight loss would help.   Followup in 1 year if EF remains normal on echo.   Marca Ancona 11/13/2019

## 2019-11-25 ENCOUNTER — Other Ambulatory Visit: Payer: Self-pay

## 2019-11-25 ENCOUNTER — Other Ambulatory Visit (HOSPITAL_COMMUNITY): Payer: Self-pay | Admitting: Cardiology

## 2019-11-25 ENCOUNTER — Ambulatory Visit (HOSPITAL_COMMUNITY)
Admission: RE | Admit: 2019-11-25 | Discharge: 2019-11-25 | Disposition: A | Discharge status: Home / Self Care | Payer: BC Managed Care – PPO | Source: Ambulatory Visit | Attending: Cardiology | Admitting: Cardiology

## 2019-11-25 DIAGNOSIS — I5022 Chronic systolic (congestive) heart failure: Secondary | ICD-10-CM | POA: Insufficient documentation

## 2019-11-25 NOTE — Progress Notes (Signed)
  Echocardiogram 2D Echocardiogram has been performed.  James Bennett 11/25/2019, 3:48 PM

## 2019-11-26 ENCOUNTER — Encounter (HOSPITAL_COMMUNITY): Payer: Self-pay

## 2019-11-28 ENCOUNTER — Telehealth (HOSPITAL_COMMUNITY): Payer: Self-pay

## 2019-11-28 DIAGNOSIS — I5022 Chronic systolic (congestive) heart failure: Secondary | ICD-10-CM

## 2019-11-28 NOTE — Telephone Encounter (Signed)
Patient advised, verbalized understanding, echo ordered and appt made  Orders Placed This Encounter  Procedures  . ECHOCARDIOGRAM LIMITED    Standing Status:   Future    Standing Expiration Date:   02/27/2021    Order Specific Question:   Where should this test be performed    Answer:   [place]    Order Specific Question:   Perflutren DEFINITY (image enhancing agent) should be administered unless hypersensitivity or allergy exist    Answer:   Administer Perflutren    Order Specific Question:   Reason for exam-Echo    Answer:   Congestive Heart Failure  428.0 / I50.9    Order Specific Question:   Release to patient    Answer:   Immediate    Order Specific Question:   Other Comments    Answer:   use definity to eval EF

## 2019-11-28 NOTE — Telephone Encounter (Signed)
-----   Message from Laurey Morale, MD sent at 11/27/2019 10:39 PM EDT ----- That is fine, he can do echo with Definity ----- Message ----- From: Noralee Space, RN Sent: 11/27/2019   4:33 PM EDT To: Laurey Morale, MD  Pt is aware, he does not want to have a cMRI as he is claustrophobic and barely made it through it before, recommended we could give Valium to relax him but still does not want to try it, he states he has gained weight and just thinks he can't do it. He would like to repeat the echo w/definity if possible. Advised I would discuss with Dr Shirlee Latch and we would call him back.

## 2019-12-05 ENCOUNTER — Ambulatory Visit (HOSPITAL_COMMUNITY)
Admission: RE | Admit: 2019-12-05 | Discharge: 2019-12-05 | Disposition: A | Discharge status: Home / Self Care | Payer: BC Managed Care – PPO | Source: Ambulatory Visit | Attending: Cardiology | Admitting: Cardiology

## 2019-12-05 ENCOUNTER — Other Ambulatory Visit: Payer: Self-pay

## 2019-12-05 DIAGNOSIS — I5022 Chronic systolic (congestive) heart failure: Secondary | ICD-10-CM | POA: Diagnosis present

## 2019-12-05 MED ORDER — PERFLUTREN LIPID MICROSPHERE
1.0000 mL | INTRAVENOUS | Status: AC | PRN
  Administered 2019-12-05: 4 mL via INTRAVENOUS
  Filled 2019-12-05: qty 10

## 2019-12-05 NOTE — Progress Notes (Signed)
  Echocardiogram 2D Echocardiogram has been performed.  Delcie Roch 12/05/2019, 2:37 PM

## 2019-12-09 ENCOUNTER — Telehealth (HOSPITAL_COMMUNITY): Payer: Self-pay | Admitting: *Deleted

## 2019-12-09 NOTE — Telephone Encounter (Signed)
Pt left VM requesting echo results. I see in his chart where it was performed but I do not see any results.  Routed to Dr.McLean

## 2019-12-10 ENCOUNTER — Encounter (HOSPITAL_COMMUNITY): Payer: Self-pay

## 2020-01-23 ENCOUNTER — Other Ambulatory Visit: Payer: Self-pay

## 2020-01-23 ENCOUNTER — Ambulatory Visit (HOSPITAL_COMMUNITY)
Admission: RE | Admit: 2020-01-23 | Discharge: 2020-01-23 | Disposition: A | Discharge status: Home / Self Care | Payer: BC Managed Care – PPO | Source: Ambulatory Visit | Attending: Internal Medicine | Admitting: Internal Medicine

## 2020-01-23 DIAGNOSIS — I5022 Chronic systolic (congestive) heart failure: Secondary | ICD-10-CM | POA: Insufficient documentation

## 2020-01-23 LAB — BASIC METABOLIC PANEL
Anion gap: 11 (ref 5–15)
BUN: 6 mg/dL (ref 6–20)
CO2: 24 mmol/L (ref 22–32)
Calcium: 9.1 mg/dL (ref 8.9–10.3)
Chloride: 105 mmol/L (ref 98–111)
Creatinine, Ser: 0.73 mg/dL (ref 0.61–1.24)
GFR calc Af Amer: >60 mL/min (ref >60)
GFR calc non Af Amer: >60 mL/min (ref >60)
Glucose, Bld: 115 mg/dL — ABNORMAL HIGH (ref 70–99)
Potassium: 3.5 mmol/L (ref 3.5–5.1)
Sodium: 140 mmol/L (ref 135–145)

## 2020-01-31 ENCOUNTER — Other Ambulatory Visit (HOSPITAL_COMMUNITY): Payer: Self-pay | Admitting: Cardiology

## 2020-02-02 ENCOUNTER — Other Ambulatory Visit (HOSPITAL_COMMUNITY): Payer: Self-pay

## 2020-02-04 MED ORDER — CITALOPRAM HYDROBROMIDE 20 MG PO TABS: 20.0000 mg | ORAL_TABLET | Freq: Every day | ORAL | 0 refills | Status: DC

## 2020-02-04 MED ORDER — TORSEMIDE 10 MG PO TABS: 10.0000 mg | ORAL_TABLET | Freq: Every day | ORAL | 3 refills | Status: DC

## 2020-03-25 ENCOUNTER — Other Ambulatory Visit (HOSPITAL_COMMUNITY): Payer: Self-pay | Admitting: Cardiology

## 2020-03-26 ENCOUNTER — Telehealth (HOSPITAL_COMMUNITY): Payer: Self-pay | Admitting: Pharmacy Technician

## 2020-03-26 NOTE — Telephone Encounter (Signed)
Patient Advocate Encounter   Received notification from [employer] that prior authorization for Sherryll Burger is required.   PA submitted on CoverMyMeds Key  [id number] Status is pending   Will continue to follow.

## 2020-03-27 NOTE — Telephone Encounter (Signed)
Patient Advocate Encounter   Resubmitted the prior authorization for [facility] with chart notes.   PA submitted on CoverMyMeds Key [id number] Status is pending   Will continue to follow.

## 2020-03-31 NOTE — Telephone Encounter (Signed)
[facility] Patient Advocate Encounter  Prior Authorization for Sherryll Burger has been approved.    Effective dates: 03/31/20 through 03/31/21  Patients co-pay is $10.  Archer Asa, CPhT

## 2020-04-27 ENCOUNTER — Other Ambulatory Visit (HOSPITAL_COMMUNITY): Payer: BC Managed Care – PPO

## 2020-06-26 ENCOUNTER — Other Ambulatory Visit (HOSPITAL_COMMUNITY): Payer: Self-pay | Admitting: Cardiology

## 2020-06-26 DIAGNOSIS — I5022 Chronic systolic (congestive) heart failure: Secondary | ICD-10-CM

## 2020-08-29 ENCOUNTER — Other Ambulatory Visit (HOSPITAL_COMMUNITY): Payer: Self-pay | Admitting: Cardiology

## 2020-09-03 ENCOUNTER — Other Ambulatory Visit (HOSPITAL_COMMUNITY): Payer: Self-pay | Admitting: Cardiology

## 2020-11-18 ENCOUNTER — Other Ambulatory Visit (HOSPITAL_COMMUNITY): Payer: Self-pay | Admitting: Cardiology

## 2021-06-24 ENCOUNTER — Other Ambulatory Visit (HOSPITAL_COMMUNITY): Payer: Self-pay | Admitting: Cardiology

## 2021-06-24 DIAGNOSIS — I5022 Chronic systolic (congestive) heart failure: Secondary | ICD-10-CM

## 2021-06-29 ENCOUNTER — Telehealth (HOSPITAL_COMMUNITY): Payer: Self-pay | Admitting: Pharmacy Technician

## 2021-06-29 NOTE — Telephone Encounter (Signed)
Patient Advocate Encounter   Received notification from [employer] that prior authorization for Sherryll Burger is required.   PA submitted on CoverMyMeds Key  [id number] Status is pending   Will continue to follow.

## 2021-07-05 NOTE — Telephone Encounter (Signed)
[facility] Patient Advocate Encounter  Prior Authorization for Sherryll Burger has been approved.    PA#  [id number] Effective dates: 06/29/2021 through 06/29/2022   Archer Asa, CPhT

## 2021-07-21 ENCOUNTER — Telehealth (HOSPITAL_COMMUNITY): Payer: Self-pay | Admitting: *Deleted

## 2021-07-21 NOTE — Telephone Encounter (Signed)
Pt's dad left VM on triage line stating pt tested + for Covid, he doesn't have PCP and would like tx.  Attempted to call pt to discuss and left him detailed VM to try Mychart for virtual visit with urgent care, call us back for further questions

## 2021-10-25 ENCOUNTER — Other Ambulatory Visit (HOSPITAL_COMMUNITY): Payer: Self-pay | Admitting: Cardiology

## 2021-11-18 ENCOUNTER — Other Ambulatory Visit (HOSPITAL_COMMUNITY): Payer: Self-pay | Admitting: Cardiology

## 2021-12-31 ENCOUNTER — Other Ambulatory Visit (HOSPITAL_COMMUNITY): Payer: Self-pay | Admitting: Cardiology

## 2022-01-31 ENCOUNTER — Other Ambulatory Visit (HOSPITAL_COMMUNITY): Payer: Self-pay | Admitting: Cardiology

## 2022-01-31 DIAGNOSIS — I5022 Chronic systolic (congestive) heart failure: Secondary | ICD-10-CM

## 2022-03-04 ENCOUNTER — Other Ambulatory Visit (HOSPITAL_COMMUNITY): Payer: Self-pay | Admitting: Cardiology

## 2022-03-04 DIAGNOSIS — I5022 Chronic systolic (congestive) heart failure: Secondary | ICD-10-CM

## 2022-04-29 ENCOUNTER — Other Ambulatory Visit (HOSPITAL_COMMUNITY): Payer: Self-pay | Admitting: Cardiology

## 2022-04-29 DIAGNOSIS — I5022 Chronic systolic (congestive) heart failure: Secondary | ICD-10-CM

## 2022-11-08 ENCOUNTER — Other Ambulatory Visit (HOSPITAL_COMMUNITY): Payer: Self-pay | Admitting: Cardiology

## 2022-11-08 DIAGNOSIS — I5022 Chronic systolic (congestive) heart failure: Secondary | ICD-10-CM

## 2022-11-09 NOTE — Telephone Encounter (Signed)
Medications denied per [facility] Last OV 4/20/ 2021 Multiple messages and letters for patient to schedule OV Patient called and now has appt for 11/23/22  -ok to refill until upcoming appt (torsemide, amlodipine,coreg,spiro) -pt reports he has been stable

## 2022-11-10 ENCOUNTER — Telehealth (HOSPITAL_COMMUNITY): Payer: Self-pay

## 2022-11-10 ENCOUNTER — Other Ambulatory Visit (HOSPITAL_COMMUNITY): Payer: Self-pay

## 2022-11-10 MED ORDER — TORSEMIDE 20 MG PO TABS: 10.0000 mg | ORAL_TABLET | Freq: Every day | ORAL | 0 refills | Status: DC

## 2022-11-10 MED ORDER — CARVEDILOL 25 MG PO TABS
25.0000 mg | ORAL_TABLET | Freq: Two times a day (BID) | ORAL | 0 refills | Status: DC
Start: 2022-11-10 — End: 2022-12-22

## 2022-11-10 MED ORDER — SPIRONOLACTONE 25 MG PO TABS: 25.0000 mg | ORAL_TABLET | Freq: Every day | ORAL | 0 refills | Status: DC

## 2022-11-10 MED ORDER — ENTRESTO 97-103 MG PO TABS: 1.0000 | ORAL_TABLET | Freq: Two times a day (BID) | ORAL | 0 refills | Status: DC

## 2022-11-10 MED ORDER — POTASSIUM CHLORIDE CRYS ER 20 MEQ PO TBCR: 20.0000 meq | EXTENDED_RELEASE_TABLET | Freq: Every day | ORAL | 0 refills | Status: DC

## 2022-11-10 MED ORDER — AMLODIPINE BESYLATE 5 MG PO TABS: 5.0000 mg | ORAL_TABLET | Freq: Every day | ORAL | 0 refills | Status: DC

## 2022-11-10 NOTE — Addendum Note (Signed)
Addended by: Theresia Bough on: 11/10/2022 10:01 AM   Modules accepted: Orders

## 2022-11-10 NOTE — Telephone Encounter (Signed)
Patient Advocate Encounter  Prior authorization for [employer] submitted and APPROVED. Test billing returns $30 copay for 30 day supply.  Key [id number] Effective: 11/10/2022 - 11/09/2023  James Bennett, CPhT Rx Patient Advocate Phone: [phone]

## 2022-11-10 NOTE — Telephone Encounter (Signed)
Refills returned  Pt aware

## 2022-11-23 ENCOUNTER — Encounter (HOSPITAL_COMMUNITY): Payer: BC Managed Care – PPO

## 2022-11-25 ENCOUNTER — Ambulatory Visit (HOSPITAL_COMMUNITY)
Admission: RE | Admit: 2022-11-25 | Discharge: 2022-11-25 | Disposition: A | Discharge status: Home / Self Care | Payer: BC Managed Care – PPO | Source: Ambulatory Visit | Attending: Internal Medicine | Admitting: Internal Medicine

## 2022-11-25 ENCOUNTER — Other Ambulatory Visit (HOSPITAL_COMMUNITY): Payer: Self-pay

## 2022-11-25 ENCOUNTER — Encounter (HOSPITAL_COMMUNITY): Payer: Self-pay

## 2022-11-25 ENCOUNTER — Telehealth (HOSPITAL_COMMUNITY): Payer: Self-pay

## 2022-11-25 VITALS — BP 139/90 | HR 86 | Wt 333.6 lb

## 2022-11-25 DIAGNOSIS — Z6841 Body Mass Index (BMI) 40.0 and over, adult: Secondary | ICD-10-CM | POA: Insufficient documentation

## 2022-11-25 DIAGNOSIS — I5032 Chronic diastolic (congestive) heart failure: Secondary | ICD-10-CM

## 2022-11-25 DIAGNOSIS — F32A Depression, unspecified: Secondary | ICD-10-CM | POA: Insufficient documentation

## 2022-11-25 DIAGNOSIS — K219 Gastro-esophageal reflux disease without esophagitis: Secondary | ICD-10-CM | POA: Diagnosis not present

## 2022-11-25 DIAGNOSIS — I110 Hypertensive heart disease with heart failure: Secondary | ICD-10-CM | POA: Diagnosis not present

## 2022-11-25 DIAGNOSIS — Z86718 Personal history of other venous thrombosis and embolism: Secondary | ICD-10-CM | POA: Diagnosis not present

## 2022-11-25 DIAGNOSIS — I5042 Chronic combined systolic (congestive) and diastolic (congestive) heart failure: Secondary | ICD-10-CM | POA: Diagnosis not present

## 2022-11-25 DIAGNOSIS — E669 Obesity, unspecified: Secondary | ICD-10-CM | POA: Insufficient documentation

## 2022-11-25 DIAGNOSIS — Z79899 Other long term (current) drug therapy: Secondary | ICD-10-CM | POA: Insufficient documentation

## 2022-11-25 DIAGNOSIS — I10 Essential (primary) hypertension: Secondary | ICD-10-CM

## 2022-11-25 LAB — CBC
HCT: 45.2 % (ref 39.0–52.0)
Hemoglobin: 15.2 g/dL (ref 13.0–17.0)
MCH: 29 pg (ref 26.0–34.0)
MCHC: 33.6 g/dL (ref 30.0–36.0)
MCV: 86.3 fL (ref 80.0–100.0)
Platelets: 299 10*3/uL (ref 150–400)
RBC: 5.24 MIL/uL (ref 4.22–5.81)
RDW: 13.3 % (ref 11.5–15.5)
WBC: 6 10*3/uL (ref 4.0–10.5)
nRBC: 0 % (ref 0.0–0.2)

## 2022-11-25 LAB — LIPID PANEL
Cholesterol: 302 mg/dL — ABNORMAL HIGH (ref 0–200)
HDL: 40 mg/dL — ABNORMAL LOW (ref >40)
LDL Cholesterol: 223 mg/dL — ABNORMAL HIGH (ref 0–99)
Total CHOL/HDL Ratio: 7.6 RATIO
Triglycerides: 196 mg/dL — ABNORMAL HIGH (ref <150)
VLDL: 39 mg/dL (ref 0–40)

## 2022-11-25 LAB — COMPREHENSIVE METABOLIC PANEL
ALT: 62 U/L — ABNORMAL HIGH (ref 0–44)
AST: 40 U/L (ref 15–41)
Albumin: 4 g/dL (ref 3.5–5.0)
Alkaline Phosphatase: 50 U/L (ref 38–126)
Anion gap: 10 (ref 5–15)
BUN: 12 mg/dL (ref 6–20)
CO2: 24 mmol/L (ref 22–32)
Calcium: 9.5 mg/dL (ref 8.9–10.3)
Chloride: 101 mmol/L (ref 98–111)
Creatinine, Ser: 0.77 mg/dL (ref 0.61–1.24)
GFR, Estimated: >60 mL/min (ref >60)
Glucose, Bld: 127 mg/dL — ABNORMAL HIGH (ref 70–99)
Potassium: 4.1 mmol/L (ref 3.5–5.1)
Sodium: 135 mmol/L (ref 135–145)
Total Bilirubin: 0.5 mg/dL (ref 0.3–1.2)
Total Protein: 8 g/dL (ref 6.5–8.1)

## 2022-11-25 LAB — HEMOGLOBIN A1C
Hgb A1c MFr Bld: 6.1 % — ABNORMAL HIGH (ref 4.8–5.6)
Mean Plasma Glucose: 128.37 mg/dL

## 2022-11-25 MED ORDER — FAMOTIDINE 40 MG PO TABS: 20.0000 mg | ORAL_TABLET | Freq: Two times a day (BID) | ORAL | 8 refills | Status: DC

## 2022-11-25 MED ORDER — EMPAGLIFLOZIN 10 MG PO TABS: 10.0000 mg | ORAL_TABLET | Freq: Every day | ORAL | 8 refills | Status: DC

## 2022-11-25 MED ORDER — ATORVASTATIN CALCIUM 80 MG PO TABS: 80.0000 mg | ORAL_TABLET | Freq: Every day | ORAL | 3 refills | Status: DC

## 2022-11-25 NOTE — Telephone Encounter (Signed)
Patient aware of lipid results. Atorvastatin called into pharmacy

## 2022-11-25 NOTE — Telephone Encounter (Signed)
-----   Message from Alen Bleacher, NP sent at 11/25/2022  1:01 PM EDT ----- Lipids very high. Start Atorvastatin 80 mg daily. Repeat lipid panel in 4-6 weeks. If no improvement will need referral to lipid clinic.

## 2022-11-25 NOTE — Progress Notes (Addendum)
Patient ID: James Bennett, male   DOB: Jun 14, 1996, 27 y.o.   MRN: [id number]  Cardiology: Dr Shirlee Latch   HPI: James Bennett is a 27 y.o. male with no medical history until hospital admit February 20th, 2017  for acute dyspnea . He was admitted with acute respiratory failure complicated by cardiogenic shock, ischemic toes and RUE DVT. Due to low output he had ischemic toes. Vascular evaluated. He did not require surgical interventio. EF 15% by echo with mildly dilated and mildly dysfunctional RV.  Low output HF with marked volume overload initially.  Was on milrinone but able to titrate off initially.  RHC on 2/23 showed good cardiac output off milrinone but still very volume overloaded.  Milrinone restarted 2/25 for recurrent profound shock.  Concern for viral myocarditis, has had viral-type symptoms for several weeks and has had antibiotic courses at home prior to admission. Coxsackie Antibody A + so suspect viral myocarditis (IgG rather than IgM, so not totally sure how recent exposure was).  cMRI with EF 29%, poor contrast study so not able to comment on infiltrative disease.  Failed milrinone wean x 2, went home on IV milrinone.  However, we were able to wean off milrinone as an outpatient.   Echo in 6/17 showed EF 35-40% with normal diastolic function and normal RV. Repeat echo in 12/17 showed EF up to 45% with diffuse hypokinesis.  Repeat echo was done 6/18, EF up to 45-50%.   Echo in 9/19 showed EF up to 55%.   Echo 5/21: EF difficult to see, LV with RWA, normal RV, LA/RA normal, no MR, mild TR. Wanted to get cMRI but patient refused 2/2 claustrophobia.   Limited echo 5/21: EF 50%, LV with GHK, LA/RA normal  Today he returns for AHF follow up, last seen in 2021. Overall feeling good. 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 Says its hard for him to make it to appt bc he has to miss work. Works almost every day of the week. Denies palpitations, CP,  dizziness, edema, or PND/Orthopnea. No SOB. Snores. Appetite good, diet not good, eats out a lot. No fever or chills. Weight at home 320 pounds. Taking all medications, has been out of potassium. States he has insurance now. Does not have PCP. Checks BP at [facility] when he goes, SBP usually 120-130. Denies ETOH, cigarettes and illicit drug use. Lives with parents.   Labs (3/18): K 4, creatinine 0.68, BNP 20.5 Labs (10/18) : K 4.4, creatinine 0.76 Labs (12/18): K 4, creatinine 0.73 Labs (12/19): K 4.2, creatinine 0.8 Labs (7/21): K 3.5, SCr .73   ECG (personally reviewed): NSR 86 bpm  PMH: 1. Nonischemic cardiomyopathy: 2/17 admitted with cardiogenic shock.  Suspected acute myocarditis, coxsackievirus A positive.  He was started on milrinone, titrated off in 3/17.  - Echo (2/17) with EF 15%, diffuse hypokinesis, mildly decreased RV systolic function.  - Cardiac MRI (3/17) with EF 29%, moderately dilated LV, mildly decreased RV systolic function, unable to assess for late gadolinium enhancement (poor quality contrast). - RHC (2/17) with mean RA 19, PA 51/32 mean 45, PCWP mean 40, CI 2.24 Fick/2.56 thermo.  - Blood type A+. - Echo (6/17): EF 35-40%, normal diastolic function, normal RV size and systolic function, PASP 42 mmHg, GLS-16.4%. - Echo (12/17): EF 45%, diffuse hypokinesis.   - Echo (6/18): EF 45-50%, diffuse hypokinesis, normal diastolic function, normal RV size and systolic function.  - Echo (9/19): EF 55%, normal RV size  and systolic function, PASP 39 mmHg 2. Ischemic toes: Suspect due to low output/cardiogenic shock.  3. RUE DVT: Now on coumadin.  4. NSVT 5. Depression 6. HTN  Review of systems complete and found to be negative unless listed in HPI.    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 Bird exposure through his father's workplace of a small parrot. No mold exposure. No recent travel.   Social Determinants of Health   Financial Resource Strain: Not on file  Food Insecurity: Not on file  Transportation Needs: Not on file  Physical Activity: Not on file  Stress: Not on file  Social Connections: Not on file  Intimate Partner Violence: Not on file   FH:  Family History  Problem Relation Age of Onset   Lupus Mother    Current Outpatient Medications  Medication Sig Dispense Refill   acetaminophen (TYLENOL) 325 MG tablet Take 2 tablets (650 mg total) by mouth every 4 (four) hours as needed for headache or mild pain.     amLODipine (NORVASC) 5 MG tablet Take 1 tablet (5 mg total) by mouth daily. 30 tablet 0   aspirin EC 81 MG tablet Take 1 tablet (81 mg total) by mouth daily. 90 tablet 3   carvedilol (COREG) 25 MG tablet Take 1 tablet (25 mg total) by mouth 2 (two) times daily with a meal. Must keep OV on 11/23/22 for additional refills 60 tablet 0   citalopram (CELEXA) 20 MG tablet Take 1 tablet (20 mg total) by mouth daily. Must get pcp to refill not a hf med. 30 tablet 0   famotidine (PEPCID) 40 MG tablet Take 0.5 tablets (20 mg total) by mouth 2 (two) times daily. 30 tablet 0   Magnesium Oxide 400 (240 Mg) MG TABS Take 1 tablet (400 mg total) by mouth 2 (two) times daily. 60 tablet 3   sacubitril-valsartan (ENTRESTO) 97-103 MG Take 1 tablet by mouth 2 (two) times daily. Must keep OV on 11/23/22 for additional refills 60 tablet 0   spironolactone (ALDACTONE) 25 MG tablet Take 1 tablet (25 mg total) by mouth daily. Must keep OV on 11/23/22 for  additional refills 30 tablet 0   torsemide (DEMADEX) 20 MG tablet Take 0.5 tablets (10 mg total) by mouth daily. Must keep OV on 11/23/22 for additional refills 15 tablet 0   No current facility-administered medications for this encounter.   Vitals:   11/25/22 0836  BP: (!) 139/90  Pulse: 86  SpO2: 99%  Weight: (!) 151.3 kg (333 lb 9.6 oz)    Wt Readings from Last 3 Encounters:  11/25/22 (!) 151.3 kg (333 lb 9.6 oz)  11/12/19 (!) 146.2 kg (322 lb 6.4 oz)  04/20/18 (!) 146.2 kg (322 lb 6.4 oz)   PHYSICAL EXAM: General:  well appearing.  No respiratory difficulty. Walked into clinic. HEENT: normal Neck:  supple. JVD flat. Carotids 2+ bilat; no bruits. No lymphadenopathy or thyromegaly appreciated. Cor: PMI nondisplaced. Regular rate & rhythm. No rubs, gallops or murmurs. Lungs: clear Abdomen: obese, soft, nontender, nondistended. No hepatosplenomegaly. No bruits or masses. Good bowel sounds. Extremities: no cyanosis, clubbing, rash, edema  Neuro: alert & oriented x 3, cranial nerves grossly intact. moves all 4 extremities w/o difficulty. Affect pleasant.   ASSESSMENT & PLAN: 1. Chronic diastolic, previously systolic CHF:  2/17 admit with cardiogenic shock.  EF 15% by echo with mildly dilated and mildly dysfunctional RV.  Was on milrinone but able to titrate off initially.  RHC on 2/23 showed good cardiac output off milrinone but still very volume overloaded.  Milrinone restarted 2/25 for recurrent profound shock.  Concern for viral myocarditis, had had viral-type symptoms for several weeks and had had antibiotic courses at home prior to admission. Coxsackie Antibody A + so suspect viral myocarditis (IgG rather than IgM, so not totally sure how recent exposure was).  cMRI with EF 29%, poor contrast study so not able to comment on infiltrative disease.  Failed milrinone wean x 2 in hospital but weaned off at home.  Echo 12/17 with EF up to 45%, diffuse hypokinesis.  Echo 12/2016 EF 45-50%. Echo  9/19 showed EF up to 55%.  He is doing well symptomatically, NYHA class I.  Volume appears stable.   - Continue Coreg 25 mg BID - Stop torsemide 10 mg daily.    - Start Jardiance 10mg  daily - Continue spironolactone 25mg  daily.   - Continue Entresto 97/103 bid.    - EF out of ICD range.  - schedule echo to assess EF - CMET, CBC, lipid panel and A1c today - repeat BMET 3 months 2. Hx RUE DVT:  Confirmed on Ultrasound 2/17. Off coumadin in 8/17, now on ASA 81 daily.   3. Depression: Has been off Celexa for "a while now"  4. Obesity:  Still needs to work on weight loss. Body mass index is 42.83 kg/m. Check A1c today. Patient insured now, can consider GLP-1. 5. HTN: Continue amlodipine 5 mg daily.  Weight loss would help.  6. GERD: Has been off pepcid for "a while" would like back. Restart  F/u with Dr. Shirlee Latch in 1 year as long as everything looks good on repeat echo. York Spaniel he is going to establish care with PCP.   Alen Bleacher AGACNP-BC  11/25/2022

## 2022-11-25 NOTE — Patient Instructions (Addendum)
Thank you for coming in today  Labs were done today, if any labs are abnormal the clinic will call you No news is good news  Please establish with a primary care doctor  Please check and record blood pressure daily, call the clinic if your top systolic blood pressure is over 130  Medications: Stop Torsemide Start Jardiance 10 mg 1 tablet daily   Follow up appointments:  Your physician recommends that you return for lab work in: 3 months BMET  Your physician recommends that you schedule a follow-up appointment in:  1 year with With Dr. Shirlee Latch  Your physician has requested that you have an echocardiogram. Echocardiography is a painless test that uses sound waves to create images of your heart. It provides your doctor with information about the size and shape of your heart and how well your heart's chambers and valves are working. This procedure takes approximately one hour. There are no restrictions for this procedure.       Do the following things EVERYDAY: Weigh yourself in the morning before breakfast. Write it down and keep it in a log. Take your medicines as prescribed Eat low salt foods--Limit salt (sodium) to 2000 mg per day.  Stay as active as you can everyday Limit all fluids for the day to less than 2 liters   At the [facility], you and your health needs are our priority. As part of our continuing mission to provide you with exceptional heart care, we have created designated Provider Care Teams. These Care Teams include your primary Cardiologist (physician) and Advanced Practice Providers (APPs- Physician Assistants and Nurse Practitioners) who all work together to provide you with the care you need, when you need it.   You may see any of the following providers on your designated Care Team at your next follow up: Dr Arvilla Meres Dr Marca Ancona Dr. Marcos Eke, NP Robbie Lis, [place] [facility] [place], [place] Brynda Peon, NP Karle Plumber, PharmD   Please be sure to bring in all your medications bottles to every appointment.    Thank you for choosing [facility]  If you have any questions or concerns before your next appointment please send [place] a message through [place] or call our office at [phone].    TO LEAVE A MESSAGE FOR THE NURSE SELECT OPTION 2, PLEASE LEAVE A MESSAGE INCLUDING: YOUR NAME DATE OF BIRTH CALL BACK NUMBER REASON FOR CALL**this is important as we prioritize the call backs  YOU WILL RECEIVE A CALL BACK THE SAME DAY AS LONG AS YOU CALL BEFORE 4:00 PM

## 2022-12-22 ENCOUNTER — Other Ambulatory Visit (HOSPITAL_COMMUNITY): Payer: Self-pay | Admitting: Cardiology

## 2022-12-22 DIAGNOSIS — I5022 Chronic systolic (congestive) heart failure: Secondary | ICD-10-CM

## 2023-01-23 ENCOUNTER — Ambulatory Visit (HOSPITAL_COMMUNITY): Payer: BC Managed Care – PPO

## 2023-02-23 ENCOUNTER — Ambulatory Visit (HOSPITAL_COMMUNITY): Payer: BC Managed Care – PPO

## 2023-05-15 ENCOUNTER — Other Ambulatory Visit (HOSPITAL_COMMUNITY): Payer: Self-pay | Admitting: Cardiology

## 2023-09-06 ENCOUNTER — Other Ambulatory Visit (HOSPITAL_COMMUNITY): Payer: Self-pay | Admitting: Cardiology

## 2023-11-16 ENCOUNTER — Ambulatory Visit (HOSPITAL_BASED_OUTPATIENT_CLINIC_OR_DEPARTMENT_OTHER)
Admission: EM | Admit: 2023-11-16 | Discharge: 2023-11-16 | Disposition: A | Discharge status: Home / Self Care | Attending: Family Medicine | Admitting: Family Medicine

## 2023-11-16 ENCOUNTER — Ambulatory Visit (HOSPITAL_BASED_OUTPATIENT_CLINIC_OR_DEPARTMENT_OTHER): Admitting: Radiology

## 2023-11-16 ENCOUNTER — Encounter (HOSPITAL_BASED_OUTPATIENT_CLINIC_OR_DEPARTMENT_OTHER): Payer: Self-pay | Admitting: Family Medicine

## 2023-11-16 ENCOUNTER — Ambulatory Visit (INDEPENDENT_AMBULATORY_CARE_PROVIDER_SITE_OTHER)
Admit: 2023-11-16 | Discharge: 2023-11-16 | Disposition: A | Discharge status: Home / Self Care | Attending: Family Medicine | Admitting: Family Medicine

## 2023-11-16 DIAGNOSIS — R112 Nausea with vomiting, unspecified: Secondary | ICD-10-CM

## 2023-11-16 DIAGNOSIS — K5900 Constipation, unspecified: Secondary | ICD-10-CM | POA: Diagnosis not present

## 2023-11-16 DIAGNOSIS — K8020 Calculus of gallbladder without cholecystitis without obstruction: Secondary | ICD-10-CM | POA: Diagnosis not present

## 2023-11-16 DIAGNOSIS — R1011 Right upper quadrant pain: Secondary | ICD-10-CM

## 2023-11-16 NOTE — Discharge Instructions (Addendum)
 Abdominal x-ray shows some stool in the colon but otherwise a normal KUB and has been reviewed by radiology.  Abdominal ultrasound definitely shows at least 1 large gallstone.  I am not sure that the gallbladder wall is actually thickened.  Radiology will review the film and I will update the patient if there are impression differs.  Encouraged to follow a healthier diet with lower fat foods and no spicy foods to decrease gallbladder symptoms.  Encouraged to contact Dr. Coley Davis office for follow-up and workup.  No medications at this time.  Follow-up as needed.

## 2023-11-16 NOTE — ED Provider Notes (Signed)
 [facility]    CSN: [id number] Arrival date & time: 11/16/23  1324      History   Chief Complaint Chief Complaint  Patient presents with   Abdominal Pain    HPI James Bennett is a 28 y.o. male.   Patient reports that on approximately 11/09/2022, he developed some intermittent right upper quadrant abdominal pain and cramps.  He had a significant drop in his appetite.  He has a long history of acid reflux and reports that he intermittently vomits stomach acid at night.  He has also had some constipation for the last week.  He reports that he took a laxative yesterday and had some loose results this morning but prior to yesterday his bowel movements have been infrequent and small balls of hard stool for about a week.  He denies chalky stools and he denies vomiting bile.  Currently he does have some intermittent nausea.  He has not eaten any solid food at all today.  He had some sugar-free lemonade at 11 AM.   Abdominal Pain Associated symptoms: constipation, nausea and vomiting   Associated symptoms: no chest pain, no chills, no cough, no diarrhea, no dysuria, no fever, no hematuria and no sore throat     Past Medical History:  Diagnosis Date   Bronchitis    Deep vein thrombosis (DVT) of upper extremity (HCC) 09/22/2015   right   Thrombocytopenia (HCC) 09/22/2015   Mild, transient    Patient Active Problem List   Diagnosis Date Noted   Chronic systolic CHF (congestive heart failure) (HCC) 10/18/2015   Long term (current) use of anticoagulants [Z79.01] 10/05/2015   DVT (deep venous thrombosis) (HCC)    Deep vein thrombosis (DVT) of upper extremity (HCC) 09/22/2015   Thrombocytopenia (HCC) 09/22/2015   Sepsis, unspecified organism (HCC)    Dilated cardiomyopathy (HCC)    Hypokalemia    Hypomagnesemia    LFT elevation 09/19/2015   Peripheral ischemia    Encounter for central line placement    Lower extremity edema    Acute respiratory failure (HCC)    Lobar  pneumonia (HCC) 09/14/2015   Sepsis (HCC) 09/14/2015    Past Surgical History:  Procedure Laterality Date   CARDIAC CATHETERIZATION N/A 09/17/2015   Procedure: Right Heart Cath;  Surgeon: Darlis Eisenmenger, MD;  [facility] INVASIVE CV LAB;  Service: Cardiovascular;  Laterality: N/A;       Home Medications    Prior to Admission medications   Medication Sig Start Date End Date Taking? Authorizing Provider  amLODipine  (NORVASC ) 5 MG tablet TAKE 1 TABLET BY MOUTH DAILY 12/22/22  Yes Sheryl Donna, NP  aspirin  EC 81 MG tablet Take 1 tablet (81 mg total) by mouth daily. 02/06/18  Yes Darlis Eisenmenger, MD  atorvastatin  (LIPITOR) 80 MG tablet Take 1 tablet (80 mg total) by mouth daily. 11/25/22 11/16/23 Yes Darlis Eisenmenger, MD  empagliflozin  (JARDIANCE ) 10 MG TABS tablet Take 1 tablet (10 mg total) by mouth daily before breakfast. 11/25/22  Yes Sheryl Donna, NP  ENTRESTO  97-103 MG TAKE 1 TABLET BY MOUTH TWICE DAILY 09/06/23  Yes Sheryl Donna, NP  spironolactone  (ALDACTONE ) 25 MG tablet TAKE 1 TABLET BY MOUTH DAILY 12/22/22  Yes Sheryl Donna, NP  acetaminophen  (TYLENOL ) 325 MG tablet Take 2 tablets (650 mg total) by mouth every 4 (four) hours as needed for headache or mild pain. 10/01/15   Tylene Galla, PA-C  carvedilol  (COREG ) 25 MG tablet TAKE 1  TABLET BY MOUTH TWICE DAILY WITH A MEAL 12/22/22   Sheryl Donna, NP  citalopram  (CELEXA ) 20 MG tablet Take 1 tablet (20 mg total) by mouth daily. Must get pcp to refill not a hf med. Patient not taking: Reported on 11/25/2022 03/26/20   Darlis Eisenmenger, MD  famotidine  (PEPCID ) 40 MG tablet Take 0.5 tablets (20 mg total) by mouth 2 (two) times daily. 11/25/22   Sheryl Donna, NP  Magnesium  Oxide 400 (240 Mg) MG TABS Take 1 tablet (400 mg total) by mouth 2 (two) times daily. 02/06/18   Darlis Eisenmenger, MD    Family History Family History  Problem Relation Age of Onset   Lupus Mother     Social History Social History   Tobacco Use   Smoking status:  Never   Smokeless tobacco: Never  Substance Use Topics   Alcohol use: No   Drug use: No     Allergies   Patient has no known allergies.   Review of Systems Review of Systems  Constitutional:  Negative for chills and fever.  HENT:  Negative for ear pain and sore throat.   Eyes:  Negative for pain and visual disturbance.  Respiratory:  Negative for cough.   Cardiovascular:  Negative for chest pain and palpitations.  Gastrointestinal:  Positive for abdominal pain, constipation, nausea and vomiting. Negative for diarrhea.  Genitourinary:  Negative for dysuria and hematuria.  Musculoskeletal:  Negative for arthralgias and back pain.  Skin:  Negative for color change and rash.  Neurological:  Negative for seizures and syncope.  All other systems reviewed and are negative.    Physical Exam Triage Vital Signs ED Triage Vitals  Encounter Vitals Group     BP 11/16/23 1354 (!) 143/85     Systolic BP Percentile --      Diastolic BP Percentile --      Pulse Rate 11/16/23 1354 81     Resp 11/16/23 1354 18     Temp 11/16/23 1354 98.1 F (36.7 C)     Temp Source 11/16/23 1354 Oral     SpO2 11/16/23 1354 97 %     Weight --      Height --      Head Circumference --      Peak Flow --      Pain Score 11/16/23 1352 6     Pain Loc --      Pain Education --      Exclude from Growth Chart --    No data found.  Updated Vital Signs BP (!) 143/85 (BP Location: Right Arm)   Pulse 81   Temp 98.1 F (36.7 C) (Oral)   Resp 18   SpO2 97%   Visual Acuity Right Eye Distance:   Left Eye Distance:   Bilateral Distance:    Right Eye Near:   Left Eye Near:    Bilateral Near:     Physical Exam Vitals and nursing note reviewed.  Constitutional:      General: He is not in acute distress.    Appearance: He is well-developed. He is morbidly obese. He is not ill-appearing or toxic-appearing.  HENT:     Head: Normocephalic and atraumatic.     Right Ear: Hearing, tympanic membrane and  external ear normal. There is impacted cerumen (Small to medium amount of hard dark cerumen.  TM is visible and normal.).     Left Ear: Hearing, tympanic membrane and external ear normal. There is impacted cerumen (  Small to medium amount of hard dark cerumen. TM is visible and normal.).     Nose: No congestion or rhinorrhea.     Right Sinus: No maxillary sinus tenderness or frontal sinus tenderness.     Left Sinus: No maxillary sinus tenderness or frontal sinus tenderness.     Mouth/Throat:     Lips: Pink.     Mouth: Mucous membranes are moist.     Pharynx: Uvula midline. No oropharyngeal exudate or posterior oropharyngeal erythema.     Tonsils: No tonsillar exudate.  Eyes:     Conjunctiva/sclera: Conjunctivae normal.     Pupils: Pupils are equal, round, and reactive to light.  Cardiovascular:     Rate and Rhythm: Normal rate and regular rhythm.     Heart sounds: S1 normal and S2 normal. No murmur heard. Pulmonary:     Effort: Pulmonary effort is normal. No respiratory distress.     Breath sounds: Normal breath sounds. No decreased breath sounds, wheezing, rhonchi or rales.  Abdominal:     General: Abdomen is protuberant. Bowel sounds are normal.     Palpations: Abdomen is soft.     Tenderness: There is abdominal tenderness (Moderate to severe) in the right upper quadrant. There is no right CVA tenderness, left CVA tenderness, guarding or rebound. Positive signs include Murphy's sign (Weakly positive). Negative signs include Rovsing's sign and McBurney's sign.  Musculoskeletal:        General: No swelling.     Cervical back: Neck supple.  Lymphadenopathy:     Head:     Right side of head: No submental, submandibular, tonsillar, preauricular or posterior auricular adenopathy.     Left side of head: No submental, submandibular, tonsillar, preauricular or posterior auricular adenopathy.     Cervical: No cervical adenopathy.     Right cervical: No superficial cervical adenopathy.    Left  cervical: No superficial cervical adenopathy.  Skin:    General: Skin is warm and dry.     Capillary Refill: Capillary refill takes less than 2 seconds.     Findings: No rash.  Neurological:     Mental Status: He is alert and oriented to person, place, and time.  Psychiatric:        Mood and Affect: Mood normal.      UC Treatments / Results  Labs (all labs ordered are listed, but only abnormal results are displayed) Labs Reviewed - No data to display  EKG   Radiology US  Abdomen Limited RUQ (LIVER/GB) Result Date: 11/16/2023 CLINICAL DATA:  Right upper quadrant pain.  Nausea vomiting EXAM: ULTRASOUND ABDOMEN LIMITED RIGHT UPPER QUADRANT COMPARISON:  None Available. FINDINGS: Gallbladder: Distended gallbladder. No wall thickening or adjacent fluid. Dependent stone. No reported sonographic Murphy's sign. Common bile duct: Diameter: 3 mm Liver: Diffusely echogenic hepatic parenchyma consistent with fatty liver infiltration. With this level of echogenicity evaluation for underlying mass lesion can be limited and if needed follow-up contrast CT or MRI as clinically appropriate. Portal vein is patent on color Doppler imaging with normal direction of blood flow towards the liver. Other: None. IMPRESSION: Gallstones. No ductal dilatation or further sonographic evidence of acute cholecystitis. Fatty liver infiltration Electronically Signed   By: Adrianna Horde M.D.   On: 11/16/2023 14:43   DG Abd 1 View Result Date: 11/16/2023 CLINICAL DATA:  Right upper quadrant abdominal pain for 1 week. EXAM: ABDOMEN - 1 VIEW COMPARISON:  None Available. FINDINGS: The bowel gas pattern is normal. No radio-opaque calculi or other significant  radiographic abnormality are seen. IMPRESSION: Negative. Electronically Signed   By: Rosalene Colon M.D.   On: 11/16/2023 14:27    Procedures Procedures (including critical care time)  Medications Ordered in UC Medications - No data to display  Initial Impression /  Assessment and Plan / UC Course  I have reviewed the triage vital signs and the nursing notes.  Pertinent labs & imaging results that were available during my care of the patient were reviewed by me and considered in my medical decision making (see chart for details).     Plan of Care: Right upper abdominal pain: Abdominal x-ray was negative except that it did show some stool in the colon but no sign of kidney stones.  Abdominal ultrasound showed 1 large gallstone on my read.  Films were reviewed by radiology and their review supports my results. Gallstone with nausea and vomiting: Advised to follow a low-fat and nonspicy food diet.   Provided information for patient to be referred to Nonda Bays, MD for further workup of his gallbladder disease. Acute constipation: Encouraged to get empty of stool with over-the-counter laxatives.  Work excuse provided.  Follow-up with Dr. Britta Candy as planned.  Encouraged to get connected with primary care for general care.  Follow-up here if needed. Final Clinical Impressions(s) / UC Diagnoses   Final diagnoses:  RUQ abdominal pain  Nausea and vomiting, unspecified vomiting type  Acute constipation  Calculus of gallbladder without cholecystitis without obstruction     Discharge Instructions      Abdominal x-ray shows some stool in the colon but otherwise a normal KUB and has been reviewed by radiology.  Abdominal ultrasound definitely shows at least 1 large gallstone.  I am not sure that the gallbladder wall is actually thickened.  Radiology will review the film and I will update the patient if there are impression differs.  Encouraged to follow a healthier diet with lower fat foods and no spicy foods to decrease gallbladder symptoms.  Encouraged to contact Dr. Coley Davis office for follow-up and workup.  No medications at this time.  Follow-up as needed.     ED Prescriptions   None    PDMP not reviewed this encounter.   Guss Legacy,  FNP 11/16/23 1450

## 2023-11-16 NOTE — ED Triage Notes (Signed)
 Pt reports x 1 week right upper abdominal cramps/pain, loss of appetite, took laxative last night because he felt constipated he had a runny bowel movement this morning.

## 2023-11-24 ENCOUNTER — Telehealth: Payer: Self-pay

## 2023-11-24 NOTE — Telephone Encounter (Signed)
   Pre-operative Risk Assessment    Patient Name: James Bennett  DOB: March 19, 1996 MRN: [id number]   Date of last office visit: 11/25/22 [facility] Date of next office visit: NONE   Request for Surgical Clearance    Procedure:   LAPAROSCOPIC CHOLECYSTECTOMY  Date of Surgery:  Clearance 11/30/23                                Surgeon:  DR Nonda Bays Surgeon's Group or Practice Name:  [facility] Phone number:  [phone] Fax number:  [phone]   Type of Clearance Requested:   - Medical  - Pharmacy:  Hold Aspirin      Type of Anesthesia:  General    Additional requests/questions:    SignedCollin Deal   11/24/2023, 5:22 PM

## 2023-11-27 ENCOUNTER — Ambulatory Visit (HOSPITAL_COMMUNITY)
Admission: RE | Admit: 2023-11-27 | Discharge: 2023-11-27 | Disposition: A | Discharge status: Home / Self Care | Source: Ambulatory Visit | Attending: Family Medicine | Admitting: Family Medicine

## 2023-11-27 ENCOUNTER — Encounter (HOSPITAL_COMMUNITY): Payer: Self-pay

## 2023-11-27 VITALS — BP 138/84 | HR 83 | Wt 310.6 lb

## 2023-11-27 DIAGNOSIS — Z86718 Personal history of other venous thrombosis and embolism: Secondary | ICD-10-CM | POA: Insufficient documentation

## 2023-11-27 DIAGNOSIS — F32A Depression, unspecified: Secondary | ICD-10-CM

## 2023-11-27 DIAGNOSIS — I10 Essential (primary) hypertension: Secondary | ICD-10-CM

## 2023-11-27 DIAGNOSIS — I5042 Chronic combined systolic (congestive) and diastolic (congestive) heart failure: Secondary | ICD-10-CM | POA: Diagnosis present

## 2023-11-27 DIAGNOSIS — Z7982 Long term (current) use of aspirin: Secondary | ICD-10-CM | POA: Insufficient documentation

## 2023-11-27 DIAGNOSIS — Z79899 Other long term (current) drug therapy: Secondary | ICD-10-CM | POA: Diagnosis not present

## 2023-11-27 DIAGNOSIS — Z6839 Body mass index (BMI) 39.0-39.9, adult: Secondary | ICD-10-CM | POA: Diagnosis not present

## 2023-11-27 DIAGNOSIS — E669 Obesity, unspecified: Secondary | ICD-10-CM | POA: Insufficient documentation

## 2023-11-27 DIAGNOSIS — I5022 Chronic systolic (congestive) heart failure: Secondary | ICD-10-CM | POA: Diagnosis not present

## 2023-11-27 DIAGNOSIS — I110 Hypertensive heart disease with heart failure: Secondary | ICD-10-CM | POA: Insufficient documentation

## 2023-11-27 DIAGNOSIS — Z7984 Long term (current) use of oral hypoglycemic drugs: Secondary | ICD-10-CM | POA: Insufficient documentation

## 2023-11-27 DIAGNOSIS — Z01818 Encounter for other preprocedural examination: Secondary | ICD-10-CM

## 2023-11-27 DIAGNOSIS — Z01810 Encounter for preprocedural cardiovascular examination: Secondary | ICD-10-CM | POA: Diagnosis not present

## 2023-11-27 LAB — BRAIN NATRIURETIC PEPTIDE: B Natriuretic Peptide: 9.5 pg/mL (ref 0.0–100.0)

## 2023-11-27 LAB — COMPREHENSIVE METABOLIC PANEL WITH GFR
ALT: 33 U/L (ref 0–44)
AST: 20 U/L (ref 15–41)
Albumin: 4.2 g/dL (ref 3.5–5.0)
Alkaline Phosphatase: 63 U/L (ref 38–126)
Anion gap: 12 (ref 5–15)
BUN: 8 mg/dL (ref 6–20)
CO2: 23 mmol/L (ref 22–32)
Calcium: 10.1 mg/dL (ref 8.9–10.3)
Chloride: 103 mmol/L (ref 98–111)
Creatinine, Ser: 0.81 mg/dL (ref 0.61–1.24)
GFR, Estimated: >60 mL/min (ref >60)
Glucose, Bld: 97 mg/dL (ref 70–99)
Potassium: 4.1 mmol/L (ref 3.5–5.1)
Sodium: 138 mmol/L (ref 135–145)
Total Bilirubin: 0.6 mg/dL (ref 0.0–1.2)
Total Protein: 8.1 g/dL (ref 6.5–8.1)

## 2023-11-27 LAB — CBC
HCT: 46.7 % (ref 39.0–52.0)
Hemoglobin: 15.1 g/dL (ref 13.0–17.0)
MCH: 28.5 pg (ref 26.0–34.0)
MCHC: 32.3 g/dL (ref 30.0–36.0)
MCV: 88.1 fL (ref 80.0–100.0)
Platelets: 377 10*3/uL (ref 150–400)
RBC: 5.3 MIL/uL (ref 4.22–5.81)
RDW: 12.9 % (ref 11.5–15.5)
WBC: 7.8 10*3/uL (ref 4.0–10.5)
nRBC: 0 % (ref 0.0–0.2)

## 2023-11-27 LAB — LIPID PANEL
Cholesterol: 211 mg/dL — ABNORMAL HIGH (ref 0–200)
HDL: 33 mg/dL — ABNORMAL LOW (ref >40)
LDL Cholesterol: 143 mg/dL — ABNORMAL HIGH (ref 0–99)
Total CHOL/HDL Ratio: 6.4 ratio
Triglycerides: 173 mg/dL — ABNORMAL HIGH (ref <150)
VLDL: 35 mg/dL (ref 0–40)

## 2023-11-27 NOTE — Telephone Encounter (Signed)
 Appt made pt aware

## 2023-11-27 NOTE — Progress Notes (Signed)
 Patient ID: James Bennett, male   DOB: September 12, 1995, 28 y.o.   MRN: [id number]  Cardiology: Dr Mitzie Anda   HPI: James Bennett is a 28 y.o. male with no medical history until hospital admit February 20th, 2017  for acute dyspnea . He was admitted with acute respiratory failure complicated by cardiogenic shock, ischemic toes and RUE DVT. Due to low output he had ischemic toes. Vascular evaluated. He did not require surgical interventio. EF 15% by echo with mildly dilated and mildly dysfunctional RV.  Low output HF with marked volume overload initially.  Was on milrinone  but able to titrate off initially.  RHC on 2/23 showed good cardiac output off milrinone  but still very volume overloaded.  Milrinone  restarted 2/25 for recurrent profound shock.  Concern for viral myocarditis, has had viral-type symptoms for several weeks and has had antibiotic courses at home prior to admission. Coxsackie Antibody A + so suspect viral myocarditis (IgG rather than IgM, so not totally sure how recent exposure was).  cMRI with EF 29%, poor contrast study so not able to comment on infiltrative disease.  Failed milrinone  wean x 2, went home on IV milrinone .  However, we were able to wean off milrinone  as an outpatient.   Echo in 6/17 showed EF 35-40% with normal diastolic function and normal RV. Repeat echo in 12/17 showed EF up to 45% with diffuse hypokinesis.  Repeat echo was done 6/18, EF up to 45-50%.   Echo in 9/19 showed EF up to 55%.   Echo 5/21: EF difficult to see, LV with RWA, normal RV, LA/RA normal, no MR, mild TR. Wanted to get cMRI but patient refused 2/2 claustrophobia.   Limited echo 5/21: EF 50%, LV with GHK, LA/RA normal  Last seen 5/24.  Today he returns for HF follow up and needs cardiac clearance. Overall feeling fine. Planning lap chole on Thursday. Works as a [employer] fast streamer. No SOB with activity. Denies palpitations, abnormal bleeding, CP, dizziness, edema, or PND/Orthopnea. Appetite ok. Weight at home 310  pounds. Taking all medications. Working on getting a PCP. No tobacco, drugs, ETOH use. Living with parents.  Labs (5/24): K 4.1, creatinine 0.77, LDL 223  ECG (personally reviewed): NSR 85 bpm  PMH: 1. Nonischemic cardiomyopathy: 2/17 admitted with cardiogenic shock.  Suspected acute myocarditis, coxsackievirus A positive.  He was started on milrinone , titrated off in 3/17.  - Echo (2/17) with EF 15%, diffuse hypokinesis, mildly decreased RV systolic function.  - Cardiac MRI (3/17) with EF 29%, moderately dilated LV, mildly decreased RV systolic function, unable to assess for late gadolinium enhancement (poor quality contrast). - RHC (2/17) with mean RA 19, PA 51/32 mean 45, PCWP mean 40, CI 2.24 Fick/2.56 thermo.  - Blood type A+. - Echo (6/17): EF 35-40%, normal diastolic function, normal RV size and systolic function, PASP 42 mmHg, GLS-16.4%. - Echo (12/17): EF 45%, diffuse hypokinesis.   - Echo (6/18): EF 45-50%, diffuse hypokinesis, normal diastolic function, normal RV size and systolic function.  - Echo (9/19): EF 55%, normal RV size and systolic function, PASP 39 mmHg - Ltd Echo (5/21): EF 50%, LV with GHK, LA/RA normal 2. Ischemic toes: Suspect due to low output/cardiogenic shock.  3. RUE DVT: previously on coumadin .  4. NSVT 5. Depression 6. HTN  Review of systems complete and found to be negative unless listed in HPI.    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  Bird exposure through his father's workplace of a small parrot. No mold exposure. No recent travel.   Social Drivers of Corporate investment banker Strain: Not on file  Food Insecurity: Not on file  Transportation Needs: Not on file  Physical Activity: Not on file  Stress: Not on file  Social Connections: Not on file  Intimate Partner Violence: Not on file   FH:  Family History  Problem Relation Age of Onset   Lupus Mother    Current Outpatient Medications  Medication Sig Dispense Refill   acetaminophen  (TYLENOL ) 325 MG tablet Take 2 tablets (650 mg total) by mouth every 4 (four) hours as needed for headache or mild pain.     amLODipine  (NORVASC ) 5 MG tablet TAKE 1 TABLET BY MOUTH DAILY 90 tablet 3   aspirin  EC 81 MG tablet Take 1 tablet (81 mg total) by mouth daily. 90 tablet 3   atorvastatin  (LIPITOR) 80 MG tablet Take 1 tablet (80 mg total) by mouth daily. 90 tablet 3   carvedilol  (COREG ) 25 MG tablet TAKE 1 TABLET BY MOUTH TWICE DAILY WITH A MEAL 180 tablet 3   citalopram  (CELEXA ) 20 MG tablet Take 1 tablet (20 mg total) by mouth daily. Must get pcp to refill not a hf med. 30 tablet 0   empagliflozin  (JARDIANCE ) 10 MG TABS tablet Take 1 tablet (10 mg total) by mouth daily before breakfast. 30 tablet 8   ENTRESTO  97-103 MG TAKE 1 TABLET BY MOUTH TWICE DAILY 180 tablet 0   famotidine  (PEPCID ) 40 MG tablet Take 0.5 tablets (20 mg total) by mouth 2 (two) times daily. 30 tablet 8   Magnesium  Oxide 400 (240 Mg) MG TABS Take 1 tablet (400 mg total) by mouth 2 (two) times daily. 60 tablet 3   spironolactone  (ALDACTONE ) 25 MG tablet TAKE 1 TABLET BY MOUTH DAILY 90 tablet 3   No current facility-administered medications for this encounter.   BP 138/84   Pulse 83   Wt (!) 140.9 kg (310 lb 9.6 oz)   SpO2 98%   BMI 39.88 kg/m   Wt Readings from Last 3 Encounters:  11/27/23 (!) 140.9 kg (310 lb 9.6 oz)  11/25/22 (!) 151.3 kg (333 lb 9.6 oz)  11/12/19 (!) 146.2 kg (322 lb 6.4 oz)    PHYSICAL EXAM: General:  NAD. No resp difficulty, walked into clinic HEENT: Normal Neck: Supple. No JVD. Thick neck Cor: Regular rate & rhythm. No rubs, gallops or murmurs. Lungs: Clear Abdomen: Soft, obese, nontender, nondistended.  Extremities: No cyanosis, clubbing, rash, edema Neuro: Alert & oriented x 3, moves all 4 extremities w/o difficulty. Affect pleasant.  ASSESSMENT & PLAN: 1. Chronic diastolic, previously systolic CHF:  2/17 admit with cardiogenic shock.  EF 15% by echo with mildly dilated and mildly dysfunctional RV.  Was on milrinone  but able to titrate off initially.  RHC on 2/23 showed good cardiac output off milrinone  but  still very volume overloaded.  Milrinone  restarted 2/25 for recurrent profound shock.  Concern for viral myocarditis, had had viral-type symptoms for several weeks and had had antibiotic courses at home prior to admission. Coxsackie Antibody A + so suspect viral myocarditis (IgG rather than IgM, so not totally sure how recent exposure was).  cMRI with EF 29%, poor contrast study so not able to comment on infiltrative disease.  Failed milrinone  wean x 2 in hospital but weaned off at home.  Echo 12/17 with EF up to 45%, diffuse hypokinesis.  Echo 12/2016 EF 45-50%. Echo 9/19 showed EF up to 55%. He is doing well symptomatically, NYHA class I.  Volume appears stable.   - Update echo. - Continue Coreg  25 mg bid - Continue Jardiance  10 mg daily - Continue spironolactone  25 mg daily.   - Continue Entresto  97/103 bid.    2. Hx RUE DVT:  Confirmed on Ultrasound 2/17. Off coumadin  in 8/17, now on ASA 81 daily.   3. Depression: On Celexa . Needs PCP follow up 4. Obesity: Body mass index is 39.88 kg/m. - He has insurance, consider GLP1 after his gallbladder surgery. 5. HTN: BP stable -  Continue GDMT as above and amlodipine  5 mg daily.  Weight loss would help.  6. Cardiac Clearance: planning lap chole 11/30/23. He is at acceptable CV risk for surgery. - OK to hold ASA  7 days before surgery, resume as soon as safe to do so post surgery. - RCRI score=1 point (6% risk of major cardiac event).  Update echo. Follow up in 6 months with Dr. Mitzie Anda.  Arlice Bene [facility] FNP-BC 11/27/2023

## 2023-11-27 NOTE — Telephone Encounter (Signed)
 Primary Cardiologist:None  Chart reviewed as part of pre-operative protocol coverage. Because of James Bennett's past medical history and time since last visit, he/she will require a follow-up visit in order to better assess preoperative cardiovascular risk.  Pre-op covering staff: - Please schedule appointment and call patient to inform them (With [facility]) - Please contact requesting surgeon's office via preferred method (i.e, phone, fax) to inform them of need for appointment prior to surgery.  If applicable, this message will also be routed to pharmacy pool and/or primary cardiologist for input on holding anticoagulant/antiplatelet agent as requested below so that this information is available at time of patient's appointment.   Gerldine Koch, NP-C 11/27/2023, 7:28 AM [address] Office [phone] Fax [phone]

## 2023-11-27 NOTE — Patient Instructions (Addendum)
 Thank you for coming in today  If you had labs drawn today, any labs that are abnormal the clinic will call you No news is good news  Please make appointment with your Primary Care Provider As Soon As Possible  Medications: No change  Follow up appointments:  Your physician recommends that you schedule a follow-up appointment in:  6 months With Dr. Mitzie Anda Please call our office to schedule the follow-up appointment in August for October 2025.  Your physician has requested that you have an echocardiogram. Echocardiography is a painless test that uses sound waves to create images of your heart. It provides your doctor with information about the size and shape of your heart and how well your heart's chambers and valves are working. This procedure takes approximately one hour. There are no restrictions for this procedure.  you will be called for this appointment to be scheduled     Do the following things EVERYDAY: Weigh yourself in the morning before breakfast. Write it down and keep it in a log. Take your medicines as prescribed Eat low salt foods--Limit salt (sodium) to 2000 mg per day.  Stay as active as you can everyday Limit all fluids for the day to less than 2 liters   At the [facility], you and your health needs are our priority. As part of our continuing mission to provide you with exceptional heart care, we have created designated Provider Care Teams. These Care Teams include your primary Cardiologist (physician) and Advanced Practice Providers (APPs- Physician Assistants and Nurse Practitioners) who all work together to provide you with the care you need, when you need it.   You may see any of the following providers on your designated Care Team at your next follow up: Dr Jules Oar Dr Peder Bourdon Dr. Mimi Alt, NP Ruddy Corral, [place] [facility] [place], [place] Dennise Fitz, NP Luster Salters, PharmD   Please be sure  to bring in all your medications bottles to every appointment.    Thank you for choosing [facility]  If you have any questions or concerns before your next appointment please send us  a message through [place] or call our office at [phone].    TO LEAVE A MESSAGE FOR THE NURSE SELECT OPTION 2, PLEASE LEAVE A MESSAGE INCLUDING: YOUR NAME DATE OF BIRTH CALL BACK NUMBER REASON FOR CALL**this is important as we prioritize the call backs  YOU WILL RECEIVE A CALL BACK THE SAME DAY AS LONG AS YOU CALL BEFORE 4:00 PM

## 2023-11-27 NOTE — Telephone Encounter (Signed)
 The HF team was able to get the pt on their schedules today for preop clearance.

## 2023-11-27 NOTE — Telephone Encounter (Signed)
 I will update the requesting office to see the notes form preop APP Slater Duncan, NP pt will need in office appt with the [facility] Failure Clinic. I will send a message to our HF clinic to call the pt with an appt. Our office just received the request 11/24/23 for procedure to be done on 11/30/23.

## 2023-11-29 DIAGNOSIS — Z01810 Encounter for preprocedural cardiovascular examination: Secondary | ICD-10-CM | POA: Diagnosis not present

## 2024-01-04 ENCOUNTER — Other Ambulatory Visit (HOSPITAL_COMMUNITY): Payer: Self-pay

## 2024-01-04 ENCOUNTER — Other Ambulatory Visit (HOSPITAL_COMMUNITY): Payer: Self-pay | Admitting: Cardiology

## 2024-01-04 MED ORDER — FAMOTIDINE 40 MG PO TABS: 20.0000 mg | ORAL_TABLET | Freq: Two times a day (BID) | ORAL | 0 refills | Status: DC

## 2024-01-04 MED ORDER — EMPAGLIFLOZIN 10 MG PO TABS: 10.0000 mg | ORAL_TABLET | Freq: Every day | ORAL | 11 refills | Status: AC

## 2024-01-05 ENCOUNTER — Other Ambulatory Visit (HOSPITAL_COMMUNITY): Payer: Self-pay

## 2024-01-05 ENCOUNTER — Telehealth (HOSPITAL_COMMUNITY): Payer: Self-pay

## 2024-01-05 NOTE — Telephone Encounter (Signed)
 Advanced [facility] Patient Advocate Encounter  Prior authorization for Entresto  has been submitted and approved. Test billing returns $30 for 30 day supply. This patient is eligible to use copay savings card.  Key: [id number] Effective: 01/04/2024 to 01/03/2025  Kennis Peacock, CPhT Rx Patient Advocate Phone: [phone]

## 2024-01-09 ENCOUNTER — Telehealth: Payer: Self-pay | Admitting: Cardiology

## 2024-01-09 NOTE — Telephone Encounter (Signed)
 Spoke to patient advised he does not need repeat echo.Echo order cancelled.

## 2024-01-09 NOTE — Telephone Encounter (Signed)
 Message sent to [facility] FNP for advice.

## 2024-01-09 NOTE — Telephone Encounter (Signed)
 Called patient to schedule echo that is to be performed in August per Mitzie Anda- patient had echo performed in May of this year due to gallbladder surgery- would Dr. Mitzie Anda still want to have this done or would he like to cancel the order?   Echo performed at [facility] and is scanned into chart

## 2024-05-27 ENCOUNTER — Other Ambulatory Visit (HOSPITAL_COMMUNITY): Payer: Self-pay | Admitting: Cardiology

## 2024-05-29 ENCOUNTER — Other Ambulatory Visit (HOSPITAL_COMMUNITY): Payer: Self-pay

## 2024-05-29 DIAGNOSIS — I5022 Chronic systolic (congestive) heart failure: Secondary | ICD-10-CM

## 2024-05-29 MED ORDER — SPIRONOLACTONE 25 MG PO TABS: 25.0000 mg | ORAL_TABLET | Freq: Every day | ORAL | 0 refills | Status: DC

## 2024-05-29 MED ORDER — CARVEDILOL 25 MG PO TABS: 25.0000 mg | ORAL_TABLET | Freq: Two times a day (BID) | ORAL | 0 refills | Status: DC

## 2024-05-29 MED ORDER — AMLODIPINE BESYLATE 5 MG PO TABS: 5.0000 mg | ORAL_TABLET | Freq: Every day | ORAL | 0 refills | Status: DC

## 2024-05-29 NOTE — Addendum Note (Signed)
 Addended by: ELINDA ROLIN SAILOR on: 05/29/2024 04:18 PM   Modules accepted: Orders

## 2024-08-09 ENCOUNTER — Other Ambulatory Visit (HOSPITAL_COMMUNITY): Payer: Self-pay | Admitting: Cardiology

## 2024-08-09 ENCOUNTER — Other Ambulatory Visit (HOSPITAL_COMMUNITY): Payer: Self-pay | Admitting: Internal Medicine

## 2024-08-09 DIAGNOSIS — I5022 Chronic systolic (congestive) heart failure: Secondary | ICD-10-CM

## 2024-08-15 ENCOUNTER — Other Ambulatory Visit (HOSPITAL_COMMUNITY): Payer: Self-pay

## 2024-08-15 DIAGNOSIS — I5022 Chronic systolic (congestive) heart failure: Secondary | ICD-10-CM

## 2024-08-15 MED ORDER — SACUBITRIL-VALSARTAN 97-103 MG PO TABS: 1.0000 | ORAL_TABLET | Freq: Two times a day (BID) | ORAL | 0 refills | Status: AC
